# Patient Record
Sex: Male | Born: 1935 | Hispanic: No | Marital: Married | Smoking: Former smoker
Health system: Southern US, Community
[De-identification: ages and names within clinical notes are randomized; demographics above are authoritative.]

## PROBLEM LIST (undated history)

## (undated) DIAGNOSIS — K635 Polyp of colon: Secondary | ICD-10-CM

## (undated) DIAGNOSIS — I10 Essential (primary) hypertension: Secondary | ICD-10-CM

## (undated) DIAGNOSIS — D509 Iron deficiency anemia, unspecified: Secondary | ICD-10-CM

## (undated) DIAGNOSIS — R972 Elevated prostate specific antigen [PSA]: Secondary | ICD-10-CM

## (undated) DIAGNOSIS — R7302 Impaired glucose tolerance (oral): Secondary | ICD-10-CM

## (undated) DIAGNOSIS — F4310 Post-traumatic stress disorder, unspecified: Secondary | ICD-10-CM

## (undated) DIAGNOSIS — G5620 Lesion of ulnar nerve, unspecified upper limb: Secondary | ICD-10-CM

## (undated) DIAGNOSIS — C801 Malignant (primary) neoplasm, unspecified: Secondary | ICD-10-CM

## (undated) DIAGNOSIS — G629 Polyneuropathy, unspecified: Secondary | ICD-10-CM

## (undated) DIAGNOSIS — K5790 Diverticulosis of intestine, part unspecified, without perforation or abscess without bleeding: Secondary | ICD-10-CM

## (undated) HISTORY — DX: Essential (primary) hypertension: I10

## (undated) HISTORY — PX: OTHER SURGICAL HISTORY: SHX169

## (undated) HISTORY — DX: Polyp of colon: K63.5

## (undated) HISTORY — PX: CYSTOSTOMY W/ BLADDER BIOPSY: SHX1431

## (undated) HISTORY — DX: Lesion of ulnar nerve, unspecified upper limb: G56.20

## (undated) HISTORY — DX: Elevated prostate specific antigen (PSA): R97.20

## (undated) HISTORY — DX: Impaired glucose tolerance (oral): R73.02

## (undated) HISTORY — DX: Diverticulosis of intestine, part unspecified, without perforation or abscess without bleeding: K57.90

---

## 2007-12-20 ENCOUNTER — Encounter: Payer: Self-pay | Admitting: Family Medicine

## 2007-12-20 DIAGNOSIS — G5620 Lesion of ulnar nerve, unspecified upper limb: Secondary | ICD-10-CM

## 2007-12-20 HISTORY — DX: Lesion of ulnar nerve, unspecified upper limb: G56.20

## 2008-07-15 ENCOUNTER — Ambulatory Visit: Payer: Self-pay | Admitting: Family Medicine

## 2008-07-16 ENCOUNTER — Encounter: Payer: Self-pay | Admitting: Family Medicine

## 2008-07-16 LAB — CONVERTED CEMR LAB
BUN: 16 mg/dL (ref 6–23)
Basophils Absolute: 0 10*3/uL (ref 0.0–0.1)
Basophils Relative: 1 % (ref 0–1)
CO2: 25 meq/L (ref 19–32)
Calcium: 9.8 mg/dL (ref 8.4–10.5)
Chloride: 107 meq/L (ref 96–112)
Cholesterol: 159 mg/dL (ref 0–200)
Creatinine, Ser: 0.82 mg/dL (ref 0.40–1.50)
Eosinophils Absolute: 0.1 10*3/uL (ref 0.0–0.7)
Eosinophils Relative: 4 % (ref 0–5)
Glucose, Bld: 119 mg/dL — ABNORMAL HIGH (ref 70–99)
HCT: 44.2 % (ref 39.0–52.0)
HDL: 47 mg/dL (ref >39)
Hemoglobin: 14.2 g/dL (ref 13.0–17.0)
LDL Cholesterol: 98 mg/dL (ref 0–99)
Lymphocytes Relative: 51 % — ABNORMAL HIGH (ref 12–46)
Lymphs Abs: 1.3 10*3/uL (ref 0.7–4.0)
MCHC: 32.1 g/dL (ref 30.0–36.0)
MCV: 90.2 fL (ref 78.0–100.0)
Monocytes Absolute: 0.3 10*3/uL (ref 0.1–1.0)
Monocytes Relative: 12 % (ref 3–12)
Neutro Abs: 0.8 10*3/uL — ABNORMAL LOW (ref 1.7–7.7)
Neutrophils Relative %: 32 % — ABNORMAL LOW (ref 43–77)
PSA: 3.43 ng/mL (ref 0.10–4.00)
Platelets: 149 10*3/uL — ABNORMAL LOW (ref 150–400)
Potassium: 4.5 meq/L (ref 3.5–5.3)
RBC: 4.9 M/uL (ref 4.22–5.81)
RDW: 14.3 % (ref 11.5–15.5)
Sodium: 142 meq/L (ref 135–145)
TSH: 2.235 microintl units/mL (ref 0.350–4.50)
Total CHOL/HDL Ratio: 3.4
Triglycerides: 71 mg/dL (ref <150)
VLDL: 14 mg/dL (ref 0–40)
WBC: 2.6 10*3/uL — ABNORMAL LOW (ref 4.0–10.5)

## 2008-07-18 DIAGNOSIS — E739 Lactose intolerance, unspecified: Secondary | ICD-10-CM

## 2008-07-18 DIAGNOSIS — I10 Essential (primary) hypertension: Secondary | ICD-10-CM | POA: Insufficient documentation

## 2008-07-21 ENCOUNTER — Telehealth: Payer: Self-pay | Admitting: Family Medicine

## 2008-07-23 ENCOUNTER — Encounter: Payer: Self-pay | Admitting: Family Medicine

## 2008-09-15 ENCOUNTER — Ambulatory Visit: Payer: Self-pay | Admitting: Family Medicine

## 2008-09-15 LAB — CONVERTED CEMR LAB
Blood Glucose, Fingerstick: 109
Hgb A1c MFr Bld: 6.7 %

## 2008-12-15 ENCOUNTER — Ambulatory Visit: Payer: Self-pay | Admitting: Family Medicine

## 2008-12-15 LAB — CONVERTED CEMR LAB: Hgb A1c MFr Bld: 6.2 %

## 2008-12-19 DIAGNOSIS — R972 Elevated prostate specific antigen [PSA]: Secondary | ICD-10-CM

## 2008-12-19 HISTORY — DX: Elevated prostate specific antigen (PSA): R97.20

## 2009-01-26 ENCOUNTER — Ambulatory Visit: Payer: Self-pay | Admitting: Family Medicine

## 2009-07-17 ENCOUNTER — Ambulatory Visit: Payer: Self-pay | Admitting: Family Medicine

## 2009-07-17 DIAGNOSIS — R5383 Other fatigue: Secondary | ICD-10-CM

## 2009-07-17 DIAGNOSIS — R5381 Other malaise: Secondary | ICD-10-CM

## 2009-07-18 DIAGNOSIS — E663 Overweight: Secondary | ICD-10-CM

## 2009-09-08 ENCOUNTER — Encounter: Payer: Self-pay | Admitting: Family Medicine

## 2009-09-10 ENCOUNTER — Encounter: Payer: Self-pay | Admitting: Family Medicine

## 2009-09-10 LAB — CONVERTED CEMR LAB
BUN: 12 mg/dL (ref 6–23)
Chloride: 106 meq/L (ref 96–112)
Eosinophils Relative: 9 % — ABNORMAL HIGH (ref 0–5)
Glucose, Bld: 126 mg/dL — ABNORMAL HIGH (ref 70–99)
HCT: 43.4 % (ref 39.0–52.0)
Hemoglobin: 13.9 g/dL (ref 13.0–17.0)
Hgb A1c MFr Bld: 6.4 % — ABNORMAL HIGH (ref 4.6–6.1)
LDL Cholesterol: 99 mg/dL (ref 0–99)
Lymphocytes Relative: 47 % — ABNORMAL HIGH (ref 12–46)
Lymphs Abs: 1.4 10*3/uL (ref 0.7–4.0)
Monocytes Absolute: 0.4 10*3/uL (ref 0.1–1.0)
Potassium: 4.5 meq/L (ref 3.5–5.3)
Triglycerides: 63 mg/dL (ref <150)
VLDL: 13 mg/dL (ref 0–40)
WBC: 2.9 10*3/uL — ABNORMAL LOW (ref 4.0–10.5)

## 2009-09-17 ENCOUNTER — Ambulatory Visit: Payer: Self-pay | Admitting: Family Medicine

## 2009-09-20 DIAGNOSIS — R972 Elevated prostate specific antigen [PSA]: Secondary | ICD-10-CM | POA: Insufficient documentation

## 2009-10-12 ENCOUNTER — Telehealth: Payer: Self-pay | Admitting: Family Medicine

## 2009-10-13 ENCOUNTER — Telehealth: Payer: Self-pay | Admitting: Family Medicine

## 2009-10-20 LAB — CONVERTED CEMR LAB: PSA: 6.58 ng/mL — ABNORMAL HIGH (ref 0.10–4.00)

## 2009-12-19 DIAGNOSIS — K635 Polyp of colon: Secondary | ICD-10-CM

## 2009-12-19 HISTORY — DX: Polyp of colon: K63.5

## 2010-01-26 ENCOUNTER — Ambulatory Visit: Payer: Self-pay | Admitting: Family Medicine

## 2010-02-11 DIAGNOSIS — J309 Allergic rhinitis, unspecified: Secondary | ICD-10-CM | POA: Insufficient documentation

## 2010-06-07 ENCOUNTER — Ambulatory Visit: Payer: Self-pay | Admitting: Family Medicine

## 2010-06-08 LAB — CONVERTED CEMR LAB
BUN: 17 mg/dL (ref 6–23)
Basophils Absolute: 0 10*3/uL (ref 0.0–0.1)
Basophils Relative: 1 % (ref 0–1)
Calcium: 10.3 mg/dL (ref 8.4–10.5)
Creatinine, Ser: 0.8 mg/dL (ref 0.40–1.50)
Eosinophils Absolute: 0.1 10*3/uL (ref 0.0–0.7)
Glucose, Bld: 111 mg/dL — ABNORMAL HIGH (ref 70–99)
Hemoglobin: 13.3 g/dL (ref 13.0–17.0)
Hgb A1c MFr Bld: 5.9 % — ABNORMAL HIGH (ref <5.7)
MCHC: 32.4 g/dL (ref 30.0–36.0)
MCV: 88.2 fL (ref 78.0–100.0)
Monocytes Absolute: 0.3 10*3/uL (ref 0.1–1.0)
Neutro Abs: 1.3 10*3/uL — ABNORMAL LOW (ref 1.7–7.7)
RDW: 13.6 % (ref 11.5–15.5)
Sodium: 141 meq/L (ref 135–145)

## 2010-07-15 ENCOUNTER — Encounter: Payer: Self-pay | Admitting: Gastroenterology

## 2010-07-16 ENCOUNTER — Encounter: Payer: Self-pay | Admitting: Gastroenterology

## 2010-07-20 ENCOUNTER — Emergency Department (HOSPITAL_COMMUNITY): Admission: EM | Admit: 2010-07-20 | Discharge: 2010-07-21 | Payer: Self-pay | Admitting: Emergency Medicine

## 2010-07-23 ENCOUNTER — Ambulatory Visit: Payer: Self-pay | Admitting: Gastroenterology

## 2010-07-23 ENCOUNTER — Encounter: Payer: Self-pay | Admitting: Family Medicine

## 2010-07-23 ENCOUNTER — Ambulatory Visit (HOSPITAL_COMMUNITY): Admission: RE | Admit: 2010-07-23 | Discharge: 2010-07-23 | Payer: Self-pay | Admitting: Gastroenterology

## 2010-07-28 ENCOUNTER — Encounter: Payer: Self-pay | Admitting: Gastroenterology

## 2010-08-04 ENCOUNTER — Telehealth (INDEPENDENT_AMBULATORY_CARE_PROVIDER_SITE_OTHER): Payer: Self-pay

## 2010-10-20 ENCOUNTER — Ambulatory Visit: Payer: Self-pay | Admitting: Family Medicine

## 2010-11-04 ENCOUNTER — Telehealth: Payer: Self-pay | Admitting: Family Medicine

## 2010-11-25 ENCOUNTER — Telehealth: Payer: Self-pay | Admitting: Family Medicine

## 2011-01-18 NOTE — Progress Notes (Signed)
Summary: Pt came by office/ was supposed to have Flex sig today  Phone Note Call from Patient   Caller: Patient Summary of Call: Pt came by office. Said he spoke with someone earlier this week and was told to come here at [address] today. Some confusion about his flex sig today. He did not prep. Has questions for Dr. Darrick Penna in reference to the Flex Sig.  He can be reached at home and does not want to reschedule yet until he speaks with Dr. Darrick Penna.  Initial call taken by: Cloria Spring LPN,  August 04, 2010 11:47 AM     Appended Document: Pt came by office/ was supposed to have Flex sig today Called pt. Discussed results of prior procedure and why pt needs a FLEX sig. Needs rectal polyp removed. Triage/RSC pt and call with instructions. RCS after AUG 31.   Appended Document: Pt came by office/ was supposed to have Flex sig today Pt came by the office and just wanted to let Dr. Darrick Penna know he has decided to NOT have the polyp removed at this time. York Spaniel he will call if he decides any different, and he appreciates all Dr. Darrick Penna has done and her taking time to explain things to him, but he has just made up his mind for now, not to do it.   Appended Document: Pt came by office/ was supposed to have Flex sig today Called pt to discuss his decision. Explained waiting to have polyp removed will only make removal of polyp down the road a lot more complicated: inability to excise via endoscopy, surgery/? colostomy, progression to cancer, blood transfusion, trip to [facility] for EMR. Pt concerned about the cost. Will call pt FEB 2012 to schedule FLEX SIG polypectomy.   Appended Document: Pt came by office/ was supposed to have Flex sig today REMINDER IN COMPUTER

## 2011-01-18 NOTE — Letter (Signed)
Summary: EKG  EKG   Imported By: Lind Guest 05/24/2010 13:25:35  _____________________________________________________________________  External Attachment:    Type:   Image     Comment:   External Document

## 2011-01-18 NOTE — Assessment & Plan Note (Signed)
Summary: office visit   Vital Signs:  Patient profile:   77 year old male Height:      61.5 inches Weight:      209 pounds BMI:     38.99 O2 Sat:      99 % Pulse rate:   77 / minute Pulse rhythm:   regular Resp:     16 per minute BP sitting:   150 / 84  (left arm) Cuff size:   large  Vitals Entered By: Everitt Amber LPN (June 07, 2010 4:01 PM)  Nutrition Counseling: Patient's BMI is greater than 25 and therefore counseled on weight management options. CC: Follow up chronic problems   CC:  Follow up chronic problems.  History of Present Illness: Reports  that he ahs been doing well. Denies recent fever or chills. Denies sinus pressure, nasal congestion , ear pain or sore throat. Denies chest congestion, or cough productive of sputum. Denies chest pain, palpitations, PND, orthopnea or leg swelling. Denies abdominal pain, nausea, vomitting, diarrhea or constipation. Denies change in bowel movements or bloody stool. Denies dysuria , frequency, incontinence or hesitancy. Denies  joint pain, swelling, or reduced mobility. Denies headaches, vertigo, seizures. Denies depression, anxiety or insomnia. Denies  rash, lesions, or itch.     Current Medications (verified): 1)  Aspirin 81 Mg  Tbec (Aspirin) .... Take 1 Tablet By Mouth Once A Day 2)  Amlodipine Besylate 5 Mg Tabs (Amlodipine Besylate) .... Take 1 Tablet By Mouth Once A Day 3)  Zinc 50 Mg Tabs (Zinc) .... Take 1 Tablet By Mouth Once A Day 4)  Stool Softener 100 Mg Caps (Docusate Sodium) .... 2 Tabs Once Daily As Needed  Allergies (verified): 1)  ! Penicillin  Review of Systems      See HPI Eyes:  Denies discharge, eye pain, and red eye. Endo:  Denies excessive thirst and excessive urination. Heme:  Denies abnormal bruising and bleeding. Allergy:  Denies hives or rash and itching eyes.  Physical Exam  General:  alert, well-hydrated, and overweight-appearing.  HEENT: No facial asymmetry,  EOMI, No sinus  tenderness, TM's Clear, oropharynx  pink and moist. Erythema and edema of nasal mucosa  Chest: Clear to auscultation bilaterally.  CVS: S1, S2, No murmurs, No S3.   Abd: Soft, Nontender.  MS: Adequate ROM spine, hips, shoulders and knees.  Ext: No edema.   CNS: CN 2-12 intact, power tone and sensation normal throughout.   Skin: Intact, no visible lesions or rashes.  Psych: Good eye contact, normal affect.  Memory intact, not anxious or depressed appearing.     Impression & Recommendations:  Problem # 1:  IMPAIRED FASTING GLUCOSE (ICD-790.21) Assessment Comment Only  Orders: T- Hemoglobin A1C ([id number]), the impt of lowcarb diet and weight loss with regular exercise was discussed and stressed  Labs Reviewed: Creat: 0.85 (09/08/2009)     Problem # 2:  PROSTATE SPECIFIC ANTIGEN, ELEVATED (ICD-790.93) Assessment: Comment Only followed by urology  Problem # 3:  HYPERTENSION (ICD-401.9) Assessment: Deteriorated  His updated medication list for this problem includes:    Amlodipine Besylate 5 Mg Tabs (Amlodipine besylate) .Marland Kitchen... Take 1 tablet by mouth once a day, lifwestyle change only at this time  BP today: 150/84 Prior BP: 130/88 (01/26/2010)  Labs Reviewed: K+: 4.5 (09/08/2009) Creat: : 0.85 (09/08/2009)   Chol: 161 (09/08/2009)   HDL: 49 (09/08/2009)   LDL: 99 (09/08/2009)   TG: 63 (09/08/2009)  Problem # 4:  OBESITY, UNSPECIFIED (ICD-278.00) Assessment: Unchanged  Ht:  61.5 (06/07/2010)   Wt: 209 (06/07/2010)   BMI: 38.99 (06/07/2010)  Complete Medication List: 1)  Aspirin 81 Mg Tbec (Aspirin) .... Take 1 tablet by mouth once a day 2)  Amlodipine Besylate 5 Mg Tabs (Amlodipine besylate) .... Take 1 tablet by mouth once a day 3)  Zinc 50 Mg Tabs (Zinc) .... Take 1 tablet by mouth once a day 4)  Stool Softener 100 Mg Caps (Docusate sodium) .... 2 tabs once daily as needed  Other Orders: T-Basic Metabolic Panel [id number]) T-CBC w/Diff  [id number]) Gastroenterology Referral (GI)  Patient Instructions: 1)  Please schedule a follow-up appointment in 4 months. 2)  It is important that you exercise regularly at least 20 minutes 5 times a week. If you develop chest pain, have severe difficulty breathing, or feel very tired , stop exercising immediately and seek medical attention. 3)  You need to lose weight. Consider a lower calorie diet and regular exercise.  4)  Pls cut back on your salt and sweets, and increase fruit and veg, drink water, and keep active. 5)  BP today is 150/84 6)  BMP prior to visit, ICD-9: 7)  CBC w/ Diff prior to visit, ICD-9: 8)  HbgA1C prior to visit, ICD-9: 9)  Pls keep appt with the urologist Prescriptions: AMLODIPINE BESYLATE 5 MG TABS (AMLODIPINE BESYLATE) Take 1 tablet by mouth once a day  #90 Not Speci x 3   Entered by:   Everitt Amber LPN   Authorized by:   Syliva Overman MD   Signed by:   Everitt Amber LPN on 29/56/2130   Method used:   [facility] to        [facility] [phone]* (retail)       [address]       [place], [place]  [address]       Ph: [phone] or [phone]       Fax: [phone]   RxID:   [id number]

## 2011-01-18 NOTE — Letter (Signed)
Summary: HISTORY AND PHYSICAL  HISTORY AND PHYSICAL   Imported By: Lind Guest 05/24/2010 13:26:04  _____________________________________________________________________  External Attachment:    Type:   Image     Comment:   External Document

## 2011-01-18 NOTE — Letter (Signed)
Summary: Internal Other /Flex Sig order  Internal Other /Flex Sig order   Imported By: Cloria Spring LPN 32/44/0102 72:53:66  _____________________________________________________________________  External Attachment:    Type:   Image     Comment:   External Document

## 2011-01-18 NOTE — Letter (Signed)
Summary: DEMO  DEMO   Imported By: Lind Guest 05/24/2010 13:25:14  _____________________________________________________________________  External Attachment:    Type:   Image     Comment:   External Document

## 2011-01-18 NOTE — Letter (Signed)
Summary: OFFICE NOTES  OFFICE NOTES   Imported By: Lind Guest 05/24/2010 13:26:47  _____________________________________________________________________  External Attachment:    Type:   Image     Comment:   External Document

## 2011-01-18 NOTE — Letter (Signed)
Summary: TRIAGE   TRIAGE   Imported By: Rexene Alberts 07/15/2010 14:17:06  _____________________________________________________________________  External Attachment:    Type:   Image     Comment:   External Document  Appended Document: TRIAGE  TRILYTE PREP. CONTINUE ASA.  Appended Document: TRIAGE  Faxed prep Rx and instructions to [facility] pharmacy. Called pt to make aware, many rings and  no answer.   Appended Document: TRIAGE  Pt informed Rx and instructions faxed to [facility].

## 2011-01-18 NOTE — Progress Notes (Signed)
Summary: medicine  Phone Note Call from Patient   Summary of Call: HE NEEDS his amlodipine 10 mg sent to [facility] so please update his phar. Initial call taken by: Lind Guest,  November 25, 2010 8:45 AM    Prescriptions: AMLODIPINE BESYLATE 10 MG TABS (AMLODIPINE BESYLATE) Take 1 tab by mouth at bedtime  #90 x 0   Entered by:   Adella Hare LPN   Authorized by:   Syliva Overman MD   Signed by:   Adella Hare LPN on 16/09/9603   Method used:   Electronically to        [facility]. [phone]* (retail)       [address]       [place], [place]  [address]       Ph: [phone] or [phone]       Fax: [phone]   RxID:   [id number]

## 2011-01-18 NOTE — Assessment & Plan Note (Signed)
Summary: office visit   Vital Signs:  Patient profile:   77 year old male Height:      61.5 inches Weight:      207.08 pounds BMI:     38.63 Pulse rate:   74 / minute Pulse rhythm:   regular Resp:     16 per minute BP sitting:   130 / 88  (left arm)  Vitals Entered By: Worthy Keeler LPN (January 26, 2010 1:08 PM)  Nutrition Counseling: Patient's BMI is greater than 25 and therefore counseled on weight management options. CC: follow up Is Patient Diabetic? No Pain Assessment Patient in pain? no        CC:  follow up.  History of Present Illness: Reports  that he has generally  doing well. Denies recent fever or chills. Denies sinus pressure, ear pain or sore throat.Reports increased nasal stuffiness with cleardrainage, not unusual at this time of the year  Denies chest congestion, or cough productive of sputum. Denies chest pain, palpitations, PND, orthopnea or leg swelling. Denies abdominal pain, nausea, vomitting, diarrhea or constipation. Denies change in bowel movements or bloody stool. Denies dysuria , frequency, incontinence or hesitancy. Denies  joint pain, swelling, or reduced mobility. Denies headaches, vertigo, seizures. Denies depression, anxiety or insomnia. Denies  rash, lesions, or itch.     Allergies: 1)  ! Penicillin  Review of Systems      See HPI Eyes:  Denies blurring and discharge. Neuro:  Denies inability to speak, seizures, and sensation of room spinning. Endo:  Denies cold intolerance, excessive hunger, excessive thirst, excessive urination, heat intolerance, polyuria, and weight change. Heme:  Denies abnormal bruising and bleeding. Allergy:  Complains of seasonal allergies and sneezing.  Physical Exam  General:  alert, well-hydrated, and overweight-appearing.  HEENT: No facial asymmetry,  EOMI, No sinus tenderness, TM's Clear, oropharynx  pink and moist. Erythema and edema of nasal mucosa  Chest: Clear to auscultation bilaterally.    CVS: S1, S2, No murmurs, No S3.   Abd: Soft, Nontender.  MS: Adequate ROM spine, hips, shoulders and knees.  Ext: No edema.   CNS: CN 2-12 intact, power tone and sensation normal throughout.   Skin: Intact, no visible lesions or rashes.  Psych: Good eye contact, normal affect.  Memory intact, not anxious or depressed appearing.     Impression & Recommendations:  Problem # 1:  PROSTATE SPECIFIC ANTIGEN, ELEVATED (ICD-790.93) Assessment Comment Only pt being followed by urology  Problem # 2:  HYPERTENSION (ICD-401.9) Assessment: Improved  His updated medication list for this problem includes:    Amlodipine Besylate 5 Mg Tabs (Amlodipine besylate) .Marland Kitchen... Take 1 tablet by mouth once a day  BP today: 130/88 Prior BP: 140/90 (09/17/2009)  Labs Reviewed: K+: 4.5 (09/08/2009) Creat: : 0.85 (09/08/2009)   Chol: 161 (09/08/2009)   HDL: 49 (09/08/2009)   LDL: 99 (09/08/2009)   TG: 63 (09/08/2009)  Problem # 3:  OBESITY, UNSPECIFIED (ICD-278.00) Assessment: Unchanged  Ht: 61.5 (01/26/2010)   Wt: 207.08 (01/26/2010)   BMI: 38.63 (01/26/2010)  Problem # 4:  IMPAIRED GLUCOSE TOLERANCE (ICD-271.3) Assessment: Comment Only hBA1C is 6.4, pt counselled on impt of low carb diet , exercise and weight loss to prevent diabetes  Problem # 5:  ALLERGIC RHINITIS (ICD-477.9) Assessment: Deteriorated  His updated medication list for this problem includes:    Zyrtec Hives Relief 10 Mg Tabs (Cetirizine hcl) .Marland Kitchen... Take 1 tablet by mouth once a day  Complete Medication List: 1)  Aspirin 81  Mg Tbec (Aspirin) .... Take 1 tablet by mouth once a day 2)  Amlodipine Besylate 5 Mg Tabs (Amlodipine besylate) .... Take 1 tablet by mouth once a day 3)  Zyrtec Hives Relief 10 Mg Tabs (Cetirizine hcl) .... Take 1 tablet by mouth once a day  Other Orders: Hemoglobin A1C ([address])  Patient Instructions: 1)  Please schedule a follow-up appointment in 4.5 months. 2)  It is important that you exercise regularly  at least 20 minutes 5 times a week. If you develop chest pain, have severe difficulty breathing, or feel very tired , stop exercising immediately and seek medical attention. 3)  You need to lose weight. Consider a lower calorie diet and regular exercise.  4)  your blood pressure is much better, no med change. 5)  new med sent in for allergies, also use saline flushes regularly Prescriptions: ZYRTEC HIVES RELIEF 10 MG TABS (CETIRIZINE HCL) Take 1 tablet by mouth once a day  #30 x 3   Entered and Authorized by:   Syliva Overman MD   Signed by:   Adella Hare LPN on 60/45/4098   Method used:   Electronically to        [facility]. [phone]* (retail)       [address]       [place], [place]  [address]       Ph: [phone] or [phone]       Fax: [phone]   RxID:   [id number]   Laboratory Results   Blood Tests   Date/Time Received: January 26, 2010  Date/Time Reported: January 26, 2010   HGBA1C: 6.4%   (Normal Range: Non-Diabetic - 3-6%   Control Diabetic - 6-8%)

## 2011-01-18 NOTE — Op Note (Signed)
Summary: Operative Report  Operative Report   Imported By: Lind Guest 07/27/2010 10:31:25  _____________________________________________________________________  External Attachment:    Type:   Image     Comment:   External Document

## 2011-01-18 NOTE — Progress Notes (Signed)
Summary: MEDICINE SEND TO [facility]  Phone Note Call from Patient   Summary of Call: PLEASE SEND HIS [facility]IC[facility]  [facility] HE HAS ENOUGH AMLOP. AND POT MEDICINE Initial call taken by: Lind Guest,  November 04, 2010 4:21 PM    Prescriptions: HYDROCHLOROTHIAZIDE 12.5 MG CAPS (HYDROCHLOROTHIAZIDE) Take 1 capsule by mouth once a day  #90 x 1   Entered by:   Adella Hare LPN   Authorized by:   Syliva Overman MD   Signed by:   Adella Hare LPN on 28/41/3244   Method used:   Faxed to ...       [facility] [address] (retail)       [address]       [place], [place]  [address]       Ph: [phone]       Fax: [phone]   RxID:   [id number]

## 2011-01-18 NOTE — Assessment & Plan Note (Signed)
Summary: office visit   Vital Signs:  Patient profile:   77 year old male Height:      61.5 inches Weight:      209 pounds BMI:     38.99 O2 Sat:      99 % on Room air Pulse rate:   82 / minute Pulse rhythm:   regular Resp:     16 per minute BP sitting:   146 / 90  (left arm)  Vitals Entered By: Mauricia Area CMA (October 20, 2010 9:27 AM)  Nutrition Counseling: Patient's BMI is greater than 25 and therefore counseled on weight management options.  O2 Flow:  Room air CC: follow up Comments Been to ER three times since last visit due to allergic reactions.   CC:  follow up.  History of Present Illness: Reports  that he has been doing fairly well Denies recent fever or chills. Denies sinus pressure, nasal congestion , ear pain or sore throat. Denies chest congestion, or cough productive of sputum. Denies chest pain, palpitations, PND, orthopnea or leg swelling. Denies abdominal pain, nausea, vomitting, diarrhea or constipation. Denies change in bowel movements or bloody stool. Denies dysuria , frequency, incontinence or hesitancy.  Denies headaches, vertigo, seizures. Denies depression, anxiety or insomnia. Denies  rash, lesions, or itch.     Allergies (verified): 1)  ! 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 Eyes:  Denies discharge, eye pain, and red eye. MS:  Complains of stiffness; OCCASIONAL JOINT STIFFNESS. Derm:  Denies itching, lesion(s), and rash. Neuro:  Denies headaches, poor balance, and seizures. Psych:  Denies anxiety and depression. Endo:  Denies cold intolerance, excessive hunger, excessive thirst, and excessive urination. Heme:  Denies abnormal bruising and bleeding. Allergy:   Complains of seasonal allergies.  Physical Exam  General:  Well-developed,well-nourished,in no acute distress; alert,appropriate and cooperative throughout examination HEENT: No facial asymmetry,  EOMI, No sinus tenderness, TM's Clear, oropharynx  pink and moist.   Chest: Clear to auscultation bilaterally.  CVS: S1, S2, No murmurs, No S3.   Abd: Soft, Nontender.  MS: Adequate ROM spine, hips, shoulders and knees.  Ext: No edema.   CNS: CN 2-12 intact, power tone and sensation normal throughout.   Skin: Intact, no visible lesions or rashes.  Psych: Good eye contact, normal affect.  Memory intact, not anxious or depressed appearing.    Impression & Recommendations:  Problem # 1:  IMPAIRED FASTING GLUCOSE (ICD-790.21) Assessment Improved  Labs Reviewed: Creat: 0.80 (06/07/2010)    Pt advised to reduce carbohydrate intake, espescially sweets, and to start regular physical activity, at least 30 minutes 5 days weekly, to enable weight loss, and reduce the risk of becoming diabetic   Problem # 2:  HYPERTENSION (ICD-401.9) Assessment: Unchanged  The following medications were removed from the medication list:    Amlodipine Besylate 5 Mg Tabs (Amlodipine besylate) .Marland Kitchen... Take 1 tablet by mouth once a day His updated medication list for this problem includes:    Amlodipine Besylate 10 Mg Tabs (Amlodipine besylate) .Marland Kitchen... Take 1 tab by mouth at bedtime    Hydrochlorothiazide 12.5 Mg Caps (Hydrochlorothiazide) .Marland Kitchen... Take 1 capsule by mouth once a day  Orders: T-Basic Metabolic Panel [id number]) Medicare Electronic Prescription [id number])  BP today: 146/90 Prior BP: 150/84 (  06/07/2010)  Labs Reviewed: K+: 3.8 (06/07/2010) Creat: : 0.80 (06/07/2010)   Chol: 161 (09/08/2009)   HDL: 49 (09/08/2009)   LDL: 99 (09/08/2009)   TG: 63 (09/08/2009)  Problem # 3:  ALLERGIC RHINITIS (ICD-477.9) Assessment: Unchanged  His updated medication list for this problem includes:    Zyrtec Hives  Relief 10 Mg Tabs (Cetirizine hcl) .Marland Kitchen... Take 1 tablet by mouth once a day as needed' for itching  Problem # 4:  OBESITY, UNSPECIFIED (ICD-278.00) Assessment: Unchanged  Ht: 61.5 (10/20/2010)   Wt: 209 (10/20/2010)   BMI: 38.99 (10/20/2010) therapeutic lifestyle change discussed and encouraged  Problem # 5:  PROSTATE SPECIFIC ANTIGEN, ELEVATED (ICD-790.93) Assessment: Comment Only followed by urology, benign disease AT THIS POINT  Complete Medication List: 1)  Aspirin 81 Mg Tbec (Aspirin) .... Take 1 tablet by mouth once a day 2)  Zinc 50 Mg Tabs (Zinc) .... Take 1 tablet by mouth once a day 3)  Stool Softener 100 Mg Caps (Docusate sodium) .... 2 tabs once daily as needed 4)  Amlodipine Besylate 10 Mg Tabs (Amlodipine besylate) .... Take 1 tab by mouth at bedtime 5)  Hydrochlorothiazide 12.5 Mg Caps (Hydrochlorothiazide) .... Take 1 capsule by mouth once a day 6)  Potassium 99 Mg Tabs (Potassium) .... Take 1 tablet by mouth once a day 7)  Zyrtec Hives Relief 10 Mg Tabs (Cetirizine hcl) .... Take 1 tablet by mouth once a day as needed' for itching  Other Orders: T- Hemoglobin A1C ([id number]) Influenza Vaccine MCR ([address])  Patient Instructions: 1)  Please schedule a follow-up appointment in 3 months. 2)  It is important that you exercise regularly at least 20 minutes 5 times a week. If you develop chest pain, have severe difficulty breathing, or feel very tired , stop exercising immediately and seek medical attention. 3)  You need to lose weight. Consider a lower calorie diet and regular exercise.  4)  your bp is high, so I am increasing the amlodipine and adding hCTZ`with potssium. 5)  see med list. 6)  BMP prior to visit, ICD-9:  fasting fierst week in december ,aalso hBA1c 7)  flu vac Prescriptions: POTASSIUM 99 MG TABS (POTASSIUM) Take 1 tablet by mouth once a day  #90 x 1   Entered and Authorized by:   Syliva Overman MD   Signed by:   Syliva Overman MD on 10/20/2010    Method used:   [facility]  [address] [phone]* (retail)       [address]       [place], [place]  [address]       Ph: [phone] or [phone]       Fax: [phone]   RxID:   [id number] HYDROCHLOROTHIAZIDE 12.5 MG CAPS (HYDROCHLOROTHIAZIDE) Take 1 capsule by mouth once a day  #90 x 1   Entered and Authorized by:   Syliva Overman MD   Signed by:   Syliva Overman MD on 10/20/2010   Method used:   Printed then faxed to ...       [facility]  [address] [phone]* (retail)       [address]       [place], [place]  [address]       Ph: [phone] or [phone]       Fax: [phone]   RxID:   [id number] AMLODIPINE BESYLATE 10 MG  TABS (AMLODIPINE BESYLATE) Take 1 tab by mouth at bedtime  #90 x 1   Entered and Authorized by:   Syliva Overman MD   Signed by:   Syliva Overman MD on 10/20/2010   Method used:   Printed then faxed to ...       [facility]  [address] [phone]* (retail)       [address]       [place], [place]  [address]       Ph: [phone] or [phone]       Fax: [phone]   RxID:   [id number]    Orders Added: 1)  Est. Patient Level IV [[address]] 2)  T-Basic Metabolic Panel [id number] 3)  T- Hemoglobin A1C [[address]-23375] 4)  Influenza Vaccine MCR [00025] 5)  Medicare Electronic Prescription [G8553]   Immunizations Administered:  Influenza Vaccine:    Vaccine Type: Fluvax MCR    Site: left deltoid    Mfr: novartis    Dose: 0.5 ml    Route: IM    Given by: Mauricia Area CMA    Exp. Date: 04/2011    Lot #: [id number]    VIS given: 07/13/10 version given October 20, 2010.   Immunizations Administered:  Influenza Vaccine:    Vaccine Type: Fluvax MCR    Site: left deltoid    Mfr: novartis    Dose: 0.5 ml    Route: IM    Given by: Mauricia Area CMA    Exp. Date: 04/2011    Lot #: [id number]    VIS given: 07/13/10 version given October 20, 2010.

## 2011-01-18 NOTE — Letter (Signed)
Summary: Internal Other Malena Peer /instructions for prep  Internal Other Malena Peer /instructions for prep   Imported By: Cloria Spring LPN 87/56/4332 95:18:84  _____________________________________________________________________  External Attachment:    Type:   Image     Comment:   External Document

## 2011-01-18 NOTE — Letter (Signed)
Summary: TRIAGE  TRIAGE   Imported By: Rexene Alberts 07/15/2010 11:54:36  _____________________________________________________________________  External Attachment:    Type:   Image     Comment:   External Document

## 2011-01-20 ENCOUNTER — Ambulatory Visit (INDEPENDENT_AMBULATORY_CARE_PROVIDER_SITE_OTHER): Payer: Medicare Other | Admitting: Family Medicine

## 2011-01-20 ENCOUNTER — Ambulatory Visit: Admit: 2011-01-20 | Payer: Self-pay | Admitting: Family Medicine

## 2011-01-20 ENCOUNTER — Encounter: Payer: Self-pay | Admitting: Family Medicine

## 2011-01-20 DIAGNOSIS — E669 Obesity, unspecified: Secondary | ICD-10-CM

## 2011-01-20 DIAGNOSIS — I10 Essential (primary) hypertension: Secondary | ICD-10-CM

## 2011-01-20 DIAGNOSIS — R7301 Impaired fasting glucose: Secondary | ICD-10-CM

## 2011-01-20 DIAGNOSIS — J309 Allergic rhinitis, unspecified: Secondary | ICD-10-CM

## 2011-01-26 NOTE — Assessment & Plan Note (Signed)
Summary: folow up 3 months   Vital Signs:  Patient profile:   77 year old male Height:      61.5 inches Weight:      217.75 pounds BMI:     40.62 O2 Sat:      98 % Pulse rate:   79 / minute Pulse rhythm:   regular Resp:     16 per minute BP sitting:   126 / 84  (left arm) Cuff size:   large  Vitals Entered By: Everitt Amber LPN (January 20, 2011 9:41 AM)  Nutrition Counseling: Patient's BMI is greater than 25 and therefore counseled on weight management options. CC: Follow up chronic problems, has been having some swelling in his ankles off and on for the past few months Comments states he hasn't been taking the HCTZ 12.5. Didn't know he was supposed to  Sometimes he takes the amlodipine two times a day if his pressure is high   CC:  Follow up chronic problems and has been having some swelling in his ankles off and on for the past few months.  History of Present Illness: Reports  that he has been doing well. Denies recent fever or chills. Denies sinus pressure, nasal congestion , ear pain or sore throat. Denies chest congestion, or cough productive of sputum. Denies chest pain, palpitations, PND, orthopnea or leg swelling. Denies abdominal pain, nausea, vomitting, diarrhea or constipation. Denies change in bowel movements or bloody stool. Denies dysuria , frequency, incontinence or hesitancy. Denies  joint pain, swelling, or reduced mobility. Denies headaches, vertigo, seizures. Denies depression, anxiety or insomnia. Denies  rash, lesions, or itch.     Current Medications (verified): 1)  Aspirin 81 Mg  Tbec (Aspirin) .... Take 1 Tablet By Mouth Once A Day 2)  Zinc 50 Mg Tabs (Zinc) .... Take 1 Tablet By Mouth Once A Day 3)  Stool Softener 100 Mg Caps (Docusate Sodium) .... 2 Tabs Once Daily As Needed 4)  Amlodipine Besylate 10 Mg Tabs (Amlodipine Besylate) .... Take 1 Tab By Mouth At Bedtime 5)  Potassium 99 Mg Tabs (Potassium) .... Take 1 Tablet By Mouth Once A Day 6)   Zyrtec Hives Relief 10 Mg Tabs (Cetirizine Hcl) .... Take 1 Tablet By Mouth Once A Day As Needed' For Itching  Allergies (verified): 1)  ! Penicillin  Review of Systems      See HPI Eyes:  Denies blurring and discharge. GI:  Complains of abdominal pain and constipation; pt reports abdominal pain and bloating with excessive belching and flatulence. MS:  Complains of joint pain; right hip and right thigh pain. Endo:  Denies cold intolerance, excessive hunger, excessive thirst, and excessive urination. Heme:  Denies abnormal bruising and bleeding. Allergy:  Denies hives or rash, itching eyes, and seasonal allergies.  Physical Exam  General:  Well-developed,well-nourished,in no acute distress; alert,appropriate and cooperative throughout examination HEENT: No facial asymmetry,  EOMI, No sinus tenderness, TM's Clear, oropharynx  pink and moist.   Chest: Clear to auscultation bilaterally.  CVS: S1, S2, No murmurs, No S3.   Abd: Soft, Nontender.  MS: Adequate ROM spine, hips, shoulders and knees.  Ext: No edema.   CNS: CN 2-12 intact, power tone and sensation normal throughout.   Skin: Intact, no visible lesions or rashes.  Psych: Good eye contact, normal affect.  Memory intact, not anxious or depressed appearing.    Impression & Recommendations:  Problem # 1:  IMPAIRED FASTING GLUCOSE (ICD-790.21) Assessment Comment Only  Orders: T-  Hemoglobin A1C [phone]) Pt advised to reduce carbohydrate intake, espescially sweets, and to start regular physical activity, at least 30 minutes 5 days weekly, to enable weight loss, and reduce the risk of becoming diabetic   Problem # 2:  OBESITY, UNSPECIFIED (ICD-278.00) Assessment: Deteriorated  Ht: 61.5 (01/20/2011)   Wt: 217.75 (01/20/2011)   BMI: 40.62 (01/20/2011) therapeutic lifestyle change discussed and encouraged  Problem # 3:  HYPERTENSION (ICD-401.9) Assessment: Improved  The following medications were removed from the  medication list:    Hydrochlorothiazide 12.5 Mg Caps (Hydrochlorothiazide) .Marland Kitchen... Take 1 capsule by mouth once a day His updated medication list for this problem includes:    Amlodipine Besylate 10 Mg Tabs (Amlodipine besylate) .Marland Kitchen... Take 1 tab by mouth at bedtime  Orders: T-Basic Metabolic Panel [phone])  BP today: 126/84 Prior BP: 146/90 (10/20/2010)  Labs Reviewed: K+: 3.8 (06/07/2010) Creat: : 0.80 (06/07/2010)   Chol: 161 (09/08/2009)   HDL: 49 (09/08/2009)   LDL: 99 (09/08/2009)   TG: 63 (09/08/2009)  Problem # 4:  PROSTATE SPECIFIC ANTIGEN, ELEVATED (ICD-790.93) Assessment: Comment Only counselled pt re the impt of urology f/u. states he is trying to [place] zinc tabs , and willhave rept labs in the Spring  Complete Medication List: 1)  Aspirin 81 Mg Tbec (Aspirin) .... Take 1 tablet by mouth once a day 2)  Zinc 50 Mg Tabs (Zinc) .... Take 1 tablet by mouth once a day 3)  Stool Softener 100 Mg Caps (Docusate sodium) .... 2 tabs once daily as needed 4)  Amlodipine Besylate 10 Mg Tabs (Amlodipine besylate) .... Take 1 tab by mouth at bedtime 5)  Potassium 99 Mg Tabs (Potassium) .... Take 1 tablet by mouth once a day 6)  Zyrtec Hives Relief 10 Mg Tabs (Cetirizine hcl) .... Take 1 tablet by mouth once a day as needed' for itching  Other Orders: T-Lipid Profile [id number]) T-CBC w/Diff [id number]) T-TSH [phone]) T-PSA ([id number])  Patient Instructions: 1)  Please schedule a follow-up appointment in 3.5 months. 2)  It is important that you exercise regularly at least 30 minutes 5 times a week. If you develop chest pain, have severe difficulty breathing, or feel very tired , stop exercising immediately and seek medical attention. 3)  You need to lose weight. Consider a lower calorie diet and regular exercise.  4)  BMP prior to visit, ICD-9: 5)  Lipid Panel prior to visit, ICD-9: 6)  TSH prior to visit, ICD-9: 7)  CBC w/ Diff prior to visit, ICD-9:  fasting in 3.5  months 8)  PSA prior to visit, ICD-9: 9)  HbgA1C prior to visit, ICD-9:   Orders Added: 1)  Est. Patient Level IV [32951] 2)  T-Basic Metabolic Panel [[id number]] 3)  T-Lipid Profile [[id number]] 4)  T-CBC w/Diff [[id number]] 5)  T- Hemoglobin A1C [83036-23375] 6)  T-TSH [[id number]] 7)  T-PSA [[id number]]

## 2011-01-26 NOTE — Letter (Signed)
Summary: Medication Reconciliation Letter  [facility]  [address]   [place], [place] [address]   Phone: [phone]  Fax: [phone]    01/20/2011 MRN: [id number]  Dustin Chandler [address] [place], [place]  [address]  Dear Mr. DANIELLO,  We are glad to see you again today!  Please take a moment to review and update your medication list and Wellness questions then give this paper to the nurse when she calls you back for your appointment.Please include the over the counter medicines that you are currently taking.  Medication List:  ASPIRIN 81 MG  TBEC (ASPIRIN) Take 1 tablet by mouth once a day ZINC 50 MG TABS (ZINC) Take 1 tablet by mouth once a day STOOL SOFTENER 100 MG CAPS (DOCUSATE SODIUM) 2 tabs once daily as needed AMLODIPINE BESYLATE 10 MG TABS (AMLODIPINE BESYLATE) Take 1 tab by mouth at bedtime POTASSIUM 99 MG TABS (POTASSIUM) Take 1 tablet by mouth once a day ZYRTEC HIVES RELIEF 10 MG TABS (CETIRIZINE HCL) Take 1 tablet by mouth once a day as needed' for itching      Medication allergies:______________________________________________  Name of your current Pharmacy and location:_____________________________  ____________________________________________  Last Colonoscopy: Date:_____________ Where:____________________________  Last Tetanus Shot:__________ Last Flu Shot:__________   Last Pneumonia Shot:________  If you are Diabetic when and where was your last eye exam?______________  __________________________________________________________  Daytime Phone number:____________________________  Do you currently smoke or use tobacco products: Yes _____   No _____ If "Yes", how many packs per day: ______________  Has it been more than one year since your last visit to a physician? Yes______  No_______  Have you been seen in the Emergency Department since your last visit to the office? Yes______  No_______ If yes, please provide the name,  location, and date seen:__________________________________________  Have you been admitted to the hospital since your last visit to the office? Yes______  No_______ If yes, please provide the name, location, and date admitted:__________________________________________  Have you been seen by another physician since your last visit to the office? Yes______  No_______ If yes, please provide the name, location, and date seen:__________________________________________  Have you made any changes to your medications since your last visit to the office? Yes______  No_______ If yes, please mark changes on the list above.  When you are finished reviewing your medications, please sign one of the following statements.   I have reviewed the list of medicines above and they are correct,  Signature: ___________________________________________                                                  OR  I have reviewed the list of medicines and corrected any errors,  Signature: ___________________________________________     Thank you

## 2011-02-04 ENCOUNTER — Encounter (INDEPENDENT_AMBULATORY_CARE_PROVIDER_SITE_OTHER): Payer: Self-pay

## 2011-02-09 NOTE — Letter (Signed)
Summary: Recall Colonoscopy/Endoscopy, Change to Office Visit  [facility]  [address]   [place], [place] [address]   Phone: [phone]  Fax: [phone]      February 04, 2011   Dustin Chandler [address] [place], [place]  [address] 1934-07-13   Dear [name],   According to our records, it is time for you to schedule a flexible sigmoidoscopy for the polyp removal.   Please call [phone] at your convenience and ask to speak to the triage nurse so we can get you set up for this procedure.  Please do not neglect your health.   Sincerely,   Cloria Spring LPN  [facility] Ph: [phone]   Fax: [phone]

## 2011-05-27 ENCOUNTER — Encounter: Payer: Self-pay | Admitting: Family Medicine

## 2011-06-01 ENCOUNTER — Ambulatory Visit: Payer: Medicare Other | Admitting: Family Medicine

## 2011-06-01 ENCOUNTER — Encounter: Payer: Self-pay | Admitting: Family Medicine

## 2011-09-03 ENCOUNTER — Other Ambulatory Visit: Payer: Self-pay | Admitting: Family Medicine

## 2011-12-16 ENCOUNTER — Other Ambulatory Visit: Payer: Self-pay | Admitting: Family Medicine

## 2011-12-17 ENCOUNTER — Other Ambulatory Visit: Payer: Self-pay | Admitting: Family Medicine

## 2011-12-19 NOTE — Telephone Encounter (Signed)
Needs appointment before can refill meds.

## 2011-12-22 ENCOUNTER — Other Ambulatory Visit: Payer: Self-pay

## 2011-12-22 MED ORDER — AMLODIPINE BESYLATE 10 MG PO TABS: ORAL_TABLET | ORAL | Status: DC

## 2011-12-26 ENCOUNTER — Encounter: Payer: Self-pay | Admitting: Family Medicine

## 2011-12-27 ENCOUNTER — Ambulatory Visit (INDEPENDENT_AMBULATORY_CARE_PROVIDER_SITE_OTHER): Payer: Medicare Other | Admitting: Family Medicine

## 2011-12-27 ENCOUNTER — Encounter: Payer: Self-pay | Admitting: Family Medicine

## 2011-12-27 DIAGNOSIS — R5381 Other malaise: Secondary | ICD-10-CM

## 2011-12-27 DIAGNOSIS — E119 Type 2 diabetes mellitus without complications: Secondary | ICD-10-CM

## 2011-12-27 DIAGNOSIS — R7301 Impaired fasting glucose: Secondary | ICD-10-CM

## 2011-12-27 DIAGNOSIS — I10 Essential (primary) hypertension: Secondary | ICD-10-CM

## 2011-12-27 DIAGNOSIS — Z23 Encounter for immunization: Secondary | ICD-10-CM

## 2011-12-27 DIAGNOSIS — R972 Elevated prostate specific antigen [PSA]: Secondary | ICD-10-CM

## 2011-12-27 DIAGNOSIS — E669 Obesity, unspecified: Secondary | ICD-10-CM

## 2011-12-27 NOTE — Patient Instructions (Addendum)
F/u in 5.5 months.  Pneumonia vaccine today. You need a flu vaccine, please get this at your pharmacy since we have no more.  Keep active and follow a healthy diet , rich in fruit and vegetable.  Fasting cbc, chem 7, hBa1C, lipid as soon as possible, past due  No med changes at this time

## 2011-12-29 ENCOUNTER — Other Ambulatory Visit: Payer: Self-pay | Admitting: Family Medicine

## 2011-12-29 LAB — CBC
Platelets: 154 10*3/uL (ref 150–400)
RBC: 4.9 MIL/uL (ref 4.22–5.81)
RDW: 13.8 % (ref 11.5–15.5)
WBC: 3.8 10*3/uL — ABNORMAL LOW (ref 4.0–10.5)

## 2011-12-29 LAB — LIPID PANEL
LDL Cholesterol: 108 mg/dL — ABNORMAL HIGH (ref 0–99)
VLDL: 15 mg/dL (ref 0–40)

## 2011-12-29 LAB — HEMOGLOBIN A1C
Hgb A1c MFr Bld: 7 % — ABNORMAL HIGH
Mean Plasma Glucose: 154 mg/dL — ABNORMAL HIGH

## 2011-12-29 LAB — COMPREHENSIVE METABOLIC PANEL
ALT: 19 U/L (ref 0–53)
AST: 17 U/L (ref 0–37)
Albumin: 4.5 g/dL (ref 3.5–5.2)
Calcium: 10.1 mg/dL (ref 8.4–10.5)
Chloride: 107 mEq/L (ref 96–112)
Potassium: 4.4 mEq/L (ref 3.5–5.3)
Total Protein: 7 g/dL (ref 6.0–8.3)

## 2012-01-01 DIAGNOSIS — E119 Type 2 diabetes mellitus without complications: Secondary | ICD-10-CM | POA: Insufficient documentation

## 2012-01-01 NOTE — Assessment & Plan Note (Signed)
Improved. Pt applauded on succesful weight loss through lifestyle change, and encouraged to continue same. Weight loss goal set for the next several months.  

## 2012-01-01 NOTE — Assessment & Plan Note (Signed)
Controlled, no change in medication  

## 2012-01-01 NOTE — Progress Notes (Signed)
  Subjective:    Patient ID: Dustin Chandler, male    DOB: 1934/03/19, 78 y.o.   MRN: [id number]  HPI The PT is here for follow up and re-evaluation of chronic medical conditions, medication management and review of any available recent lab and radiology data.  Preventive health is updated, specifically  Cancer screening and Immunization.   Questions or concerns regarding consultations or procedures which the PT has had in the interim are  addressed. The PT denies any adverse reactions to current medications since the last visit.  There are no new concerns.  There are no specific complaints       Review of Systems See HPI Denies recent fever or chills. Denies sinus pressure, nasal congestion, ear pain or sore throat. Denies chest congestion, productive cough or wheezing. Denies chest pains, palpitations and leg swelling Denies abdominal pain, nausea, vomiting,diarrhea or constipation.   Denies dysuria, frequency, hesitancy or incontinence. C/o back pain and stiffness intermittently, not disabling. Denies headaches, seizures, numbness, or tingling. Denies depression, anxiety or insomnia. Denies skin break down or rash.        Objective:   Physical Exam Patient alert and oriented and in no cardiopulmonary distress.  HEENT: No facial asymmetry, EOMI, no sinus tenderness,  oropharynx pink and moist.  Neck supple no adenopathy.  Chest: Clear to auscultation bilaterally.  CVS: S1, S2 no murmurs, no S3.  ABD: Soft non tender. Bowel sounds normal.  Ext: No edema  MS: Adequate ROM spine, shoulders, hips and knees.  Skin: Intact, no ulcerations or rash noted.  Psych: Good eye contact, normal affect. Memory intact not anxious or depressed appearing.  CNS: CN 2-12 intact, power, tone and sensation normal throughout.        Assessment & Plan:

## 2012-01-01 NOTE — Assessment & Plan Note (Signed)
New diagnosis based on lab obtained after vist, p to start med and return fr re eval in 3 months, also counseled to test sugars once daily as well as to go to the diabetic ed class

## 2012-01-01 NOTE — Assessment & Plan Note (Signed)
States he wants no further eval of this problem at this time, which i assure him is not unreasonable at this age. Denies symptoms of urinary obstruction

## 2012-01-16 ENCOUNTER — Other Ambulatory Visit: Payer: Self-pay

## 2012-01-16 ENCOUNTER — Ambulatory Visit: Payer: Medicare Other

## 2012-01-16 DIAGNOSIS — I10 Essential (primary) hypertension: Secondary | ICD-10-CM

## 2012-01-16 MED ORDER — GLUCOSE BLOOD VI STRP: ORAL_STRIP | Status: DC

## 2012-01-16 MED ORDER — AMLODIPINE BESYLATE 10 MG PO TABS: ORAL_TABLET | ORAL | Status: DC

## 2012-01-16 MED ORDER — ACCU-CHEK MULTICLIX LANCETS MISC: Status: DC

## 2012-06-29 ENCOUNTER — Ambulatory Visit (INDEPENDENT_AMBULATORY_CARE_PROVIDER_SITE_OTHER): Payer: Medicare Other | Admitting: Family Medicine

## 2012-06-29 ENCOUNTER — Other Ambulatory Visit: Payer: Self-pay | Admitting: Family Medicine

## 2012-06-29 ENCOUNTER — Encounter: Payer: Self-pay | Admitting: Family Medicine

## 2012-06-29 VITALS — BP 150/100 | HR 60 | Resp 16 | Ht 74.0 in | Wt 207.0 lb

## 2012-06-29 DIAGNOSIS — Z0000 Encounter for general adult medical examination without abnormal findings: Secondary | ICD-10-CM

## 2012-06-29 DIAGNOSIS — I10 Essential (primary) hypertension: Secondary | ICD-10-CM

## 2012-06-29 LAB — BASIC METABOLIC PANEL WITH GFR
BUN: 12 mg/dL (ref 6–23)
CO2: 31 meq/L (ref 19–32)
Calcium: 10.4 mg/dL (ref 8.4–10.5)
Chloride: 106 meq/L (ref 96–112)
Creat: 0.8 mg/dL (ref 0.50–1.35)
Glucose, Bld: 124 mg/dL — ABNORMAL HIGH (ref 70–99)
Potassium: 4.8 meq/L (ref 3.5–5.3)
Sodium: 142 meq/L (ref 135–145)

## 2012-06-29 LAB — HEMOGLOBIN A1C
Hgb A1c MFr Bld: 6.8 % — ABNORMAL HIGH
Mean Plasma Glucose: 148 mg/dL — ABNORMAL HIGH

## 2012-06-29 MED ORDER — AMLODIPINE BESYLATE 10 MG PO TABS: 10.0000 mg | ORAL_TABLET | Freq: Every day | ORAL | Status: DC

## 2012-06-29 NOTE — Patient Instructions (Addendum)
F/u in 3.5 month  You need amlodipine 10mg  daily  Lab today HBA1C and chem 7.  Colonoscopy every 10 years.  Consider seriously advanced directives for end of life issues, and discuss with your family  No more cancer screening for prostate at this time, per your decision.  It is important that you exercise regularly at least 30 minutes 5 times a week. If you develop chest pain, have severe difficulty breathing, or feel very tired, stop exercising immediately and seek medical attention    A healthy diet is rich in fruit, vegetables and whole grains. Poultry fish, nuts and beans are a healthy choice for protein rather then red meat. A low sodium diet and drinking 64 ounces of water daily is generally recommended. Oils and sweet should be limited. Carbohydrates especially for those who are diabetic or overweight, should be limited to 30-45 gram per meal. It is important to eat on a regular schedule, at least 3 times daily. Snacks should be primarily fruits, vegetables or nuts.

## 2012-07-01 ENCOUNTER — Encounter: Payer: Self-pay | Admitting: Family Medicine

## 2012-07-01 DIAGNOSIS — Z0000 Encounter for general adult medical examination without abnormal findings: Secondary | ICD-10-CM | POA: Insufficient documentation

## 2012-07-01 DIAGNOSIS — Z7189 Other specified counseling: Secondary | ICD-10-CM | POA: Insufficient documentation

## 2012-07-01 NOTE — Assessment & Plan Note (Signed)
Annual wellness visit conducted. Updated labs needed. Pt refuses further f/u on elevated PSA and currently wants no further testing. Full assessment and recommendations are in the body of the note

## 2012-07-01 NOTE — Progress Notes (Signed)
Subjective:    Patient ID: Dustin Chandler, male    DOB: 1934-05-19, 78 y.o.   MRN: [id number]  HPI    Review of Systems     Objective:   Physical Exam        Assessment & Plan:  Preventive Screening-Counseling & Management   Patient present here today for a Medicare annual wellness visit.   Current Problems (verified)   Medications Prior to Visit Allergies (verified)   PAST HISTORY  Family History  Social History    Risk Factors  Current exercise habits: daily for 30 minutes, active in garden as well as doing odd jobs around house. I encouraged specific time to be set aside for physical activity specifically for his health, 30 minutes at least 5 days per week   Dietary issues discussed:Fruit , vegetable and adequate water stressed. He is already committed to healthy diet   Cardiac risk factors: hypertension, dyslipidemia and diabetes, managed by diet only per pt preference  Depression Screen  (Note: if answer to either of the following is "Yes", a more complete depression screening is indicated)   Over the past two weeks, have you felt down, depressed or hopeless? No , has recently resolved battle over his housing with the help of his children. Tears up over this , but states he has given all the worry of this over to his children Over the past two weeks, have you felt little interest or pleasure in doing things? No  Have you lost interest or pleasure in daily life? No  Do you often feel hopeless? No  Do you cry easily over simple problems? No   Activities of Daily Living  In your present state of health, do you have any difficulty performing the following activities?  Driving?: No Managing money?: No Feeding yourself?:No Getting from bed to chair?:No Climbing a flight of stairs?:No Preparing food and eating?:No Bathing or showering?:No Getting dressed?:No Getting to the toilet?:No Using the toilet?:No Moving around from place to place?: No  Fall  Risk Assessment In the past year have you fallen or had a near fall?:No Are you currently taking any medications that make you experience  dizziness?:No   Hearing Difficulties: No Do you often ask people to speak up or repeat themselves?:No Do you experience ringing or noises in your ears?:No Do you have difficulty understanding soft or whispered voices?:No  Cognitive Testing  Alert? Yes Normal Appearance?Yes  Oriented to person? Yes Place? Yes  Time? Yes  Displays appropriate judgment?Yes  Can read the correct time from a watch face? yes Are you having problems remembering things?No  Advanced Directives have been discussed with the patient?Yes    List the Names of Other Physician/Practitioners you currently use: None. Has elevated PSA, no interest in urology follow up, and refuses further PSA testing at this time   Indicate any recent Medical Services you may have received from other than [facility] providers in the past year (date may be approximate).   Assessment:    Annual Wellness Exam   Plan:    During the course of the visit the patient was educated and counseled about appropriate screening and preventive services including:  A healthy diet is rich in fruit, vegetables and whole grains. Poultry fish, nuts and beans are a healthy choice for protein rather then red meat. A low sodium diet and drinking 64 ounces of water daily is generally recommended. Oils and sweet should be limited. Carbohydrates especially for those who are diabetic or overweight, should  be limited to 30-45 gram per meal. It is important to eat on a regular schedule, at least 3 times daily. Snacks should be primarily fruits, vegetables or nuts. It is important that you exercise regularly at least 30 minutes 5 times a week. If you develop chest pain, have severe difficulty breathing, or feel very tired, stop exercising immediately and seek medical attention  Immunization reviewed and updated. Cancer screening  reviewed and updated    Patient Instructions (the written plan) was given to the patient.  Medicare Attestation  I have personally reviewed:  The patient's medical and social history  Their use of alcohol, tobacco or illicit drugs  Their current medications and supplements  The patient's functional ability including ADLs,fall risks, home safety risks, cognitive, and hearing and visual impairment  Diet and physical activities  Evidence for depression or mood disorders  The patient's weight, height, BMI, and visual acuity have been recorded in the chart. I have made referrals, counseling, and provided education to the patient based on review of the above and I have provided the patient with a written personalized care plan for preventive services.

## 2012-07-01 NOTE — Assessment & Plan Note (Signed)
Uncontrolled, pt to increase the dose of norvasc back to 10mg 

## 2012-07-04 ENCOUNTER — Ambulatory Visit: Payer: Medicare Other | Admitting: Family Medicine

## 2012-10-30 ENCOUNTER — Ambulatory Visit (INDEPENDENT_AMBULATORY_CARE_PROVIDER_SITE_OTHER): Payer: Medicare Other | Admitting: Family Medicine

## 2012-10-30 ENCOUNTER — Encounter: Payer: Self-pay | Admitting: Family Medicine

## 2012-10-30 VITALS — BP 130/80 | HR 80 | Resp 18 | Ht 74.0 in | Wt 212.1 lb

## 2012-10-30 DIAGNOSIS — E663 Overweight: Secondary | ICD-10-CM

## 2012-10-30 DIAGNOSIS — R972 Elevated prostate specific antigen [PSA]: Secondary | ICD-10-CM

## 2012-10-30 DIAGNOSIS — Z6825 Body mass index (BMI) 25.0-25.9, adult: Secondary | ICD-10-CM

## 2012-10-30 DIAGNOSIS — Z23 Encounter for immunization: Secondary | ICD-10-CM

## 2012-10-30 DIAGNOSIS — I10 Essential (primary) hypertension: Secondary | ICD-10-CM

## 2012-10-30 DIAGNOSIS — E119 Type 2 diabetes mellitus without complications: Secondary | ICD-10-CM

## 2012-10-30 DIAGNOSIS — E785 Hyperlipidemia, unspecified: Secondary | ICD-10-CM

## 2012-10-30 NOTE — Progress Notes (Signed)
  Subjective:    Patient ID: Dustin Chandler, male    DOB: 1934/02/24, 78 y.o.   MRN: [id number]  HPI The PT is here for follow up and re-evaluation of chronic medical conditions, medication management and review of any available recent lab and radiology data.  Preventive health is updated, specifically  Cancer screening and Immunization.   Questions or concerns regarding consultations or procedures which the PT has had in the interim are  addressed. The PT denies any adverse reactions to current medications since the last visit.  There are no new concerns.  There are no specific complaints       Review of Systems See HPI Denies recent fever or chills. Denies sinus pressure, nasal congestion, ear pain or sore throat. Denies chest congestion, productive cough or wheezing. Denies chest pains, palpitations and leg swelling Denies abdominal pain, nausea, vomiting,diarrhea or constipation.   Denies dysuria, frequency, hesitancy or incontinence. Denies joint pain, swelling and limitation in mobility. Denies headaches, seizures, numbness, or tingling. Denies depression, anxiety or insomnia. Denies skin break down or rash.        Objective:   Physical Exam Patient alert and oriented and in no cardiopulmonary distress.  HEENT: No facial asymmetry, EOMI, no sinus tenderness,  oropharynx pink and moist.  Neck supple no adenopathy.  Chest: Clear to auscultation bilaterally.  CVS: S1, S2 no murmurs, no S3.  ABD: Soft non tender. Bowel sounds normal.  Ext: No edema  MS: Adequate ROM spine, shoulders, hips and knees.  Skin: Intact, no ulcerations or rash noted.  Psych: Good eye contact, normal affect. Memory intact not anxious or depressed appearing.  CNS: CN 2-12 intact, power, tone and sensation normal throughout.        Assessment & Plan:

## 2012-10-30 NOTE — Patient Instructions (Addendum)
F/u with rectal end March, please call if you need me before  Flu vaccine today.  CBC, fasting lipid and chem 7 and HBa1C in March before visit  It is important that you exercise regularly at least 30 minutes 5 times a week. If you develop chest pain, have severe difficulty breathing, or feel very tired, stop exercising immediately and seek medical attention

## 2012-11-07 DIAGNOSIS — E785 Hyperlipidemia, unspecified: Secondary | ICD-10-CM | POA: Insufficient documentation

## 2012-11-07 NOTE — Assessment & Plan Note (Signed)
Improved, diet controlled only. Pt applauded on this, needs updated lab

## 2012-11-07 NOTE — Assessment & Plan Note (Signed)
Controlled, no change in medication DASH diet and commitment to daily physical activity for a minimum of 30 minutes discussed and encouraged, as a part of hypertension management. The importance of attaining a healthy weight is also discussed.  

## 2012-11-07 NOTE — Assessment & Plan Note (Signed)
Hyperlipidemia:Low fat diet discussed and encouraged.  Updated lab next year 

## 2012-12-17 ENCOUNTER — Other Ambulatory Visit: Payer: Self-pay | Admitting: Family Medicine

## 2013-06-20 ENCOUNTER — Other Ambulatory Visit: Payer: Self-pay | Admitting: Family Medicine

## 2016-03-30 ENCOUNTER — Emergency Department (HOSPITAL_COMMUNITY)
Admission: EM | Admit: 2016-03-30 | Discharge: 2016-03-30 | Disposition: A | Discharge status: Home / Self Care | Payer: Medicare Other | Attending: Emergency Medicine | Admitting: Emergency Medicine

## 2016-03-30 ENCOUNTER — Encounter (HOSPITAL_COMMUNITY): Payer: Self-pay | Admitting: Emergency Medicine

## 2016-03-30 DIAGNOSIS — Z87891 Personal history of nicotine dependence: Secondary | ICD-10-CM | POA: Insufficient documentation

## 2016-03-30 DIAGNOSIS — Z7984 Long term (current) use of oral hypoglycemic drugs: Secondary | ICD-10-CM | POA: Diagnosis not present

## 2016-03-30 DIAGNOSIS — R339 Retention of urine, unspecified: Secondary | ICD-10-CM | POA: Diagnosis not present

## 2016-03-30 DIAGNOSIS — E119 Type 2 diabetes mellitus without complications: Secondary | ICD-10-CM | POA: Diagnosis not present

## 2016-03-30 DIAGNOSIS — Z7982 Long term (current) use of aspirin: Secondary | ICD-10-CM | POA: Insufficient documentation

## 2016-03-30 DIAGNOSIS — R338 Other retention of urine: Secondary | ICD-10-CM

## 2016-03-30 DIAGNOSIS — I10 Essential (primary) hypertension: Secondary | ICD-10-CM | POA: Insufficient documentation

## 2016-03-30 NOTE — ED Provider Notes (Signed)
CSN: [id number]     Arrival date & time 03/30/16  0620 History   First MD Initiated Contact with Patient 03/30/16 [phone]     Chief Complaint  Patient presents with  . Urinary Retention     (Consider location/radiation/quality/duration/timing/severity/associated sxs/prior Treatment) The history is provided by the patient.  82 year old male with history of diabetes, hypertension had an indwelling Foley catheter. It came out yesterday. Wife states that the balloon did not appear to be inflated, and it came out. Since then, he has been unable to urinate. He denies fever, chills, sweats. He denies abdominal pain or back pain. There is been no nausea or vomiting.  Past Medical History  Diagnosis Date  . Hypertension   . Impaired glucose tolerance   . 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 Colon polyps 2011    Non cancerous   . Diverticulosis   . Elevated PSA 2010    dr. Neoma Lamingkrishan follows   . Diabetes mellitus    Past Surgical History  Procedure Laterality Date  . Removal of benign mole      15 years ago   Family History  Problem Relation Age of Onset  . Heart failure Mother   . Pneumonia Father   . Coronary artery disease Brother     bowel obstruction  . Pneumonia Sister   . Stroke Sister   . Aneurysm Sister    Social History  Substance Use Topics  . Smoking status: Former Smoker    Types: Cigarettes  . Smokeless tobacco: None  . Alcohol Use: No    Review of Systems  All other systems reviewed and are negative.     Allergies  Penicillins  Home Medications   Prior to Admission medications   Medication Sig Start Date End Date Taking? Authorizing Provider  apixaban (ELIQUIS) 5 MG TABS tablet Take 5 mg by mouth 2 (two) times daily.   Yes Historical Provider, MD  finasteride (PROSCAR) 5 MG tablet Take 5 mg by mouth daily.   Yes Historical Provider, MD  lisinopril (PRINIVIL,ZESTRIL) 40 MG tablet Take 40 mg by mouth daily.   Yes Historical Provider, MD  metFORMIN  (GLUCOPHAGE) 500 MG tablet Take by mouth daily with breakfast.   Yes Historical Provider, MD  tamsulosin (FLOMAX) 0.4 MG CAPS capsule Take 0.4 mg by mouth.   Yes Historical Provider, MD  amLODipine (NORVASC) 10 MG tablet TAKE ONE TABLET BY MOUTH EVERY DAY 06/20/13   Kerri PerchesMargaret E Simpson, MD  aspirin (ASPIRIN LOW DOSE) 81 MG EC tablet Take 81 mg by mouth daily.      Historical Provider, MD   BP 183/119 mmHg  Pulse 104  Temp(Src) 97.5 F (36.4 C) (Oral)  Resp 22  Ht 6\' 1"  (1.854 m)  Wt 180 lb (81.647 kg)  BMI 23.75 kg/m2  SpO2 100% Physical Exam  Nursing note and vitals reviewed.  82 year old male, resting comfortably and in no acute distress. Vital signs are significant for hypertension, tachycardia, tachypnea. Oxygen saturation is 100%, which is normal. Head is normocephalic and atraumatic. PERRLA, EOMI. Oropharynx is clear. Neck is nontender and supple without adenopathy or JVD. Back is nontender and there is no CVA tenderness. Lungs are clear without rales, wheezes, or rhonchi. Chest is nontender. Heart has regular rate and rhythm without murmur. Abdomen is soft, flat, nontender. Bladder is distended, but there are no other masses or hepatosplenomegaly and peristalsis is normoactive. Extremities have no cyanosis or edema, full range of motion is  present. Skin is warm and dry without rash. Neurologic: Mental status is normal, cranial nerves are intact, there are no motor or sensory deficits.  ED Course  Procedures (including critical care time)   MDM   Final diagnoses:  Acute urinary retention    Acute urinary retention in a patient who is apparently Foley catheter dependent. New Foley catheter is placed, which drained 1000  Milliliters of urine. He is discharged with instructions to follow up with PCP.    Dione Booze, MD 03/30/16 [phone]

## 2016-03-30 NOTE — ED Notes (Signed)
Pt states his catheter came out yesterday at home and since then he states he has urinated very little.

## 2016-03-30 NOTE — Discharge Instructions (Signed)
Acute Urinary Retention, Male °Acute urinary retention is the temporary inability to urinate. °This is a common problem in older men. As men age their prostates become larger and block the flow of urine from the bladder. This is usually a problem that has come on gradually.  °HOME CARE INSTRUCTIONS °If you are sent home with a Foley catheter and a drainage system, you will need to discuss the best course of action with your health care provider. While the catheter is in, maintain a good intake of fluids. Keep the drainage bag emptied and lower than your catheter. This is so that contaminated urine will not flow back into your bladder, which could lead to a urinary tract infection. °There are two main types of drainage bags. One is a large bag that usually is used at night. It has a good capacity that will allow you to sleep through the night without having to empty it. The second type is called a leg bag. It has a smaller capacity, so it needs to be emptied more frequently. However, the main advantage is that it can be attached by a leg strap and can go underneath your clothing, allowing you the freedom to move about or leave your home. °Only take over-the-counter or prescription medicines for pain, discomfort, or fever as directed by your health care provider.  °SEEK MEDICAL CARE IF: °· You develop a low-grade fever. °· You experience spasms or leakage of urine with the spasms. °SEEK IMMEDIATE MEDICAL CARE IF:  °1. You develop chills or fever. °2. Your catheter stops draining urine. °3. Your catheter falls out. °4. You start to develop increased bleeding that does not respond to rest and increased fluid intake. °MAKE SURE YOU: °1. Understand these instructions. °2. Will watch your condition. °3. Will get help right away if you are not doing well or get worse. °  °This information is not intended to replace advice given to you by your health care provider. Make sure you discuss any questions you have with your  health care provider. °  °Document Released: 03/13/2001 Document Revised: 04/21/2015 Document Reviewed: 05/16/2013 °Elsevier Interactive Patient Education ©2016 [employer] ° ° ° ° °Foley Catheter Care, Adult °A Foley catheter is a soft, flexible tube that is placed into the bladder to drain urine. A Foley catheter may be inserted if: °· You leak urine or are not able to control when you urinate (urinary incontinence). °· You are not able to urinate when you need to (urinary retention). °· You had prostate surgery or surgery on the genitals. °· You have certain medical conditions, such as multiple sclerosis, dementia, or a spinal cord injury. °If you are going home with a Foley catheter in place, follow the instructions below. °TAKING CARE OF THE CATHETER °5. Wash your hands with soap and water. °6. Using mild soap and warm water on a clean washcloth: °¨ Clean the area on your body closest to the catheter insertion site using a circular motion, moving away from the catheter. Never wipe toward the catheter because this could sweep bacteria up into the urethra and cause infection. °¨ Remove all traces of soap. Pat the area dry with a clean towel. For males, reposition the foreskin. °7. Attach the catheter to your leg so there is no tension on the catheter. Use adhesive tape or a leg strap. If you are using adhesive tape, remove any sticky residue left behind by the previous tape you used. °8. Keep the drainage bag below the level of   the bladder, but keep it off the floor. °9. Check throughout the day to be sure the catheter is working and urine is draining freely. Make sure the tubing does not become kinked. °10. Do not pull on the catheter or try to remove it. Pulling could damage internal tissues. °TAKING CARE OF THE DRAINAGE BAGS °You will be given two drainage bags to take home. One is a large overnight drainage bag, and the other is a smaller leg bag that fits underneath clothing. You may wear the overnight bag  at any time, but you should never wear the smaller leg bag at night. Follow the instructions below for how to empty, change, and clean your drainage bags. °Emptying the Drainage Bag °You must empty your drainage bag when it is  -½ full or at least 2-3 times a day. °4. Wash your hands with soap and water. °5. Keep the drainage bag below your hips, below the level of your bladder. This stops urine from going back into the tubing and into your bladder. °6. Hold the dirty bag over the toilet or a clean container. °7. Open the pour spout at the bottom of the bag and empty the urine into the toilet or container. Do not let the pour spout touch the toilet, container, or any other surface. Doing so can place bacteria on the bag, which can cause an infection. °8. Clean the pour spout with a gauze pad or cotton ball that has rubbing alcohol on it. °9. Close the pour spout. °10. Attach the bag to your leg with adhesive tape or a leg strap. °11. Wash your hands well. °Changing the Drainage Bag °Change your drainage bag once a month or sooner if it starts to smell bad or look dirty. Below are steps to follow when changing the drainage bag. °1. Wash your hands with soap and water. °2. Pinch off the rubber catheter so that urine does not spill out. °3. Disconnect the catheter tube from the drainage tube at the connection valve. Do not let the tubes touch any surface. °4. Clean the end of the catheter tube with an alcohol wipe. Use a different alcohol wipe to clean the end of the drainage tube. °5. Connect the catheter tube to the drainage tube of the clean drainage bag. °6. Attach the new bag to the leg with adhesive tape or a leg strap. Avoid attaching the new bag too tightly. °7. Wash your hands well. °Cleaning the Drainage Bag °1. Wash your hands with soap and water. °2. Wash the bag in warm, soapy water. °3. Rinse the bag thoroughly with warm water. °4. Fill the bag with a solution of white vinegar and water (1 cup vinegar to  1 qt warm water [.2 L vinegar to 1 L warm water]). Close the bag and soak it for 30 minutes in the solution. °5. Rinse the bag with warm water. °6. Hang the bag to dry with the pour spout open and hanging downward. °7. Store the clean bag (once it is dry) in a clean plastic bag. °8. Wash your hands well. °PREVENTING INFECTION °· Wash your hands before and after handling your catheter. °· Take showers daily and wash the area where the catheter enters your body. Do not take baths. Replace wet leg straps with dry ones, if this applies. °· Do not use powders, sprays, or lotions on the genital area. Only use creams, lotions, or ointments as directed by your caregiver. °· For females, wipe from front to back after   each bowel movement. °· Drink enough fluids to keep your urine clear or pale yellow unless you have a fluid restriction. °· Do not let the drainage bag or tubing touch or lie on the floor. °· Wear cotton underwear to absorb moisture and to keep your skin drier. °SEEK MEDICAL CARE IF:  °· Your urine is cloudy or smells unusually bad. °· Your catheter becomes clogged. °· You are not draining urine into the bag or your bladder feels full. °· Your catheter starts to leak. °SEEK IMMEDIATE MEDICAL CARE IF:  °· You have pain, swelling, redness, or pus where the catheter enters the body. °· You have pain in the abdomen, legs, lower back, or bladder. °· You have a fever. °· You see blood fill the catheter, or your urine is pink or red. °· You have nausea, vomiting, or chills. °· Your catheter gets pulled out. °MAKE SURE YOU:  °· Understand these instructions. °· Will watch your condition. °· Will get help right away if you are not doing well or get worse. °  °This information is not intended to replace advice given to you by your health care provider. Make sure you discuss any questions you have with your health care provider. °  °Document Released: 12/05/2005 Document Revised: 04/21/2014 Document Reviewed:  11/26/2012 °Elsevier Interactive Patient Education ©2016 [employer] Inc[employer] ° °

## 2016-05-02 ENCOUNTER — Emergency Department (HOSPITAL_COMMUNITY)
Admission: EM | Admit: 2016-05-02 | Discharge: 2016-05-02 | Disposition: A | Discharge status: Home / Self Care | Payer: Medicare Other | Attending: Emergency Medicine | Admitting: Emergency Medicine

## 2016-05-02 ENCOUNTER — Encounter (HOSPITAL_COMMUNITY): Payer: Self-pay

## 2016-05-02 DIAGNOSIS — E119 Type 2 diabetes mellitus without complications: Secondary | ICD-10-CM | POA: Insufficient documentation

## 2016-05-02 DIAGNOSIS — Z7984 Long term (current) use of oral hypoglycemic drugs: Secondary | ICD-10-CM | POA: Insufficient documentation

## 2016-05-02 DIAGNOSIS — Z87891 Personal history of nicotine dependence: Secondary | ICD-10-CM | POA: Diagnosis not present

## 2016-05-02 DIAGNOSIS — I10 Essential (primary) hypertension: Secondary | ICD-10-CM | POA: Diagnosis not present

## 2016-05-02 DIAGNOSIS — Z79899 Other long term (current) drug therapy: Secondary | ICD-10-CM | POA: Diagnosis not present

## 2016-05-02 DIAGNOSIS — R339 Retention of urine, unspecified: Secondary | ICD-10-CM | POA: Insufficient documentation

## 2016-05-02 LAB — URINE MICROSCOPIC-ADD ON

## 2016-05-02 LAB — URINALYSIS, ROUTINE W REFLEX MICROSCOPIC
BILIRUBIN URINE: NEGATIVE
Glucose, UA: NEGATIVE mg/dL
KETONES UR: NEGATIVE mg/dL
LEUKOCYTES UA: NEGATIVE
NITRITE: POSITIVE — AB
PROTEIN: NEGATIVE mg/dL
Specific Gravity, Urine: 1.01 (ref 1.005–1.030)
pH: 5.5 (ref 5.0–8.0)

## 2016-05-02 NOTE — ED Provider Notes (Signed)
CSN: [id number]     Arrival date & time 05/02/16  1637 History   First MD Initiated Contact with Patient 05/02/16 1717     Chief Complaint  Patient presents with  . Urinary Retention     (Consider location/radiation/quality/duration/timing/severity/associated sxs/prior Treatment) HPI....Marland Kitchen.Marland Kitchen.Urinary retention since this morning. Patient had a Foley catheter in place, but it  came out earlier today. He's been unable to urinate since that time. He has on known [facility]  No fever, chills, flank pain, dysuria. Patient is very uncomfortable.  Past Medical History  Diagnosis Date  . Hypertension   . Impaired glucose tolerance   . 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 Colon polyps 2011    Non cancerous   . Diverticulosis   . Elevated PSA 2010    dr. Neoma Lamingkrishan follows   . Diabetes mellitus    Past Surgical History  Procedure Laterality Date  . Removal of benign mole      15 years ago  . Cystostomy w/ bladder biopsy     Family History  Problem Relation Age of Onset  . Heart failure Mother   . Pneumonia Father   . Coronary artery disease Brother     bowel obstruction  . Pneumonia Sister   . Stroke Sister   . Aneurysm Sister    Social History  Substance Use Topics  . Smoking status: Former Smoker    Types: Cigarettes  . Smokeless tobacco: None  . Alcohol Use: No    Review of Systems  All other systems reviewed and are negative.     Allergies  Penicillins  Home Medications   Prior to Admission medications   Medication Sig Start Date End Date Taking? Authorizing Provider  apixaban (ELIQUIS) 5 MG TABS tablet Take 5 mg by mouth 2 (two) times daily.   Yes Historical Provider, MD  cholecalciferol (VITAMIN D) 1000 units tablet Take 1,000 Units by mouth daily.   Yes Historical Provider, MD  Cyanocobalamin (B-12 PO) Take 1 tablet by mouth daily.   Yes Historical Provider, MD  finasteride (PROSCAR) 5 MG tablet Take 5 mg by mouth  daily.   Yes Historical Provider, MD  lisinopril (PRINIVIL,ZESTRIL) 40 MG tablet Take 40 mg by mouth daily.   Yes Historical Provider, MD  metFORMIN (GLUCOPHAGE) 500 MG tablet Take 500 mg by mouth daily.    Yes Historical Provider, MD  tamsulosin (FLOMAX) 0.4 MG CAPS capsule Take 0.4 mg by mouth daily after supper.    Yes Historical Provider, MD  thiamine (VITAMIN B-1) 100 MG tablet Take 100 mg by mouth daily.   Yes Historical Provider, MD   BP 163/86 mmHg  Pulse 69  Temp(Src) 97.9 F (36.6 C) (Oral)  Resp 18  Ht 6\' 1"  (1.854 m)  Wt 190 lb (86.183 kg)  BMI 25.07 kg/m2  SpO2 100% Physical Exam  Constitutional: He is oriented to person, place, and time. He appears well-developed and well-nourished.  HENT:  Head: Normocephalic and atraumatic.  Eyes: Conjunctivae and EOM are normal. Pupils are equal, round, and reactive to light.  Neck: Normal range of motion. Neck supple.  Cardiovascular: Normal rate and regular rhythm.   Pulmonary/Chest: Effort normal and breath sounds normal.  Abdominal: Soft. Bowel sounds are normal.  Tender suprapubic area  Musculoskeletal: Normal range of motion.  Neurological: He is alert and oriented to person, place, and time.  Skin: Skin is warm and dry.  Psychiatric: He has  a normal mood and affect. His behavior is normal.  Nursing note and vitals reviewed.   ED Course  Procedures (including critical care time) Labs Review Labs Reviewed  URINALYSIS, ROUTINE W REFLEX MICROSCOPIC (NOT AT [facility]) - Abnormal; Notable for the following:    Hgb urine dipstick SMALL (*)    Nitrite POSITIVE (*)    All other components within normal limits  URINE MICROSCOPIC-ADD ON - Abnormal; Notable for the following:    Squamous Epithelial / LPF 0-5 (*)    Bacteria, UA MANY (*)    All other components within normal limits    Imaging Review No results found. I have personally reviewed and evaluated these images and lab results as part of my medical decision-making.    EKG Interpretation None      MDM   Final diagnoses:  Urinary retention    Foley catheter placed with excellent urinary output. Patient will keep Foley catheter in place and follow-up with his urologist. Urine culture pending    Donnetta Hutching, MD 05/02/16 2033

## 2016-05-02 NOTE — Discharge Instructions (Signed)
Urinalysis showed no evidence of infection. Keep Foley catheter in. Follow-up with urologist.

## 2016-05-02 NOTE — ED Notes (Signed)
Pt reports that foley cathter balloon ruptured  And foley came out. Unable to urinate  And here to have foley replaced

## 2016-05-02 NOTE — ED Notes (Signed)
Pt states understanding of care given and follow up instructions. Pt requested to keep standard drainage bag.  Informed pt to follow up with urology

## 2016-05-27 ENCOUNTER — Emergency Department (HOSPITAL_COMMUNITY)
Admission: EM | Admit: 2016-05-27 | Discharge: 2016-05-27 | Disposition: A | Discharge status: Home / Self Care | Payer: Medicare Other | Attending: Emergency Medicine | Admitting: Emergency Medicine

## 2016-05-27 ENCOUNTER — Encounter (HOSPITAL_COMMUNITY): Payer: Self-pay | Admitting: Cardiology

## 2016-05-27 DIAGNOSIS — Z87891 Personal history of nicotine dependence: Secondary | ICD-10-CM | POA: Insufficient documentation

## 2016-05-27 DIAGNOSIS — E119 Type 2 diabetes mellitus without complications: Secondary | ICD-10-CM | POA: Diagnosis not present

## 2016-05-27 DIAGNOSIS — Z79899 Other long term (current) drug therapy: Secondary | ICD-10-CM | POA: Insufficient documentation

## 2016-05-27 DIAGNOSIS — Z7984 Long term (current) use of oral hypoglycemic drugs: Secondary | ICD-10-CM | POA: Diagnosis not present

## 2016-05-27 DIAGNOSIS — I10 Essential (primary) hypertension: Secondary | ICD-10-CM | POA: Diagnosis not present

## 2016-05-27 DIAGNOSIS — R339 Retention of urine, unspecified: Secondary | ICD-10-CM | POA: Diagnosis not present

## 2016-05-27 LAB — URINALYSIS, ROUTINE W REFLEX MICROSCOPIC
Bilirubin Urine: NEGATIVE
Glucose, UA: NEGATIVE mg/dL
Ketones, ur: NEGATIVE mg/dL
Leukocytes, UA: NEGATIVE
Nitrite: NEGATIVE
PH: 6 (ref 5.0–8.0)
PROTEIN: NEGATIVE mg/dL
Specific Gravity, Urine: <1.005 — ABNORMAL LOW (ref 1.005–1.030)

## 2016-05-27 LAB — URINE MICROSCOPIC-ADD ON
BACTERIA UA: NONE SEEN
WBC, UA: NONE SEEN WBC/hpf (ref 0–5)

## 2016-05-27 MED ORDER — MORPHINE SULFATE (PF) 4 MG/ML IV SOLN
4.0000 mg | Freq: Once | INTRAVENOUS | Status: AC
  Administered 2016-05-27: 4 mg via INTRAVENOUS
  Filled 2016-05-27: qty 1

## 2016-05-27 MED ORDER — LIDOCAINE HCL 2 % EX GEL
1.0000 "application " | Freq: Once | CUTANEOUS | Status: AC
  Administered 2016-05-27: 1 via URETHRAL
  Filled 2016-05-27: qty 10

## 2016-05-27 NOTE — ED Provider Notes (Signed)
CSN: [id number]     Arrival date & time 05/27/16  1758 History   First MD Initiated Contact with Patient 05/27/16 1809     Chief Complaint  Patient presents with  . Urinary Retention     (Consider location/radiation/quality/duration/timing/severity/associated sxs/prior Treatment) HPI...Marland Kitchen.Marland Kitchen.Patient has known prostatic hypertrophy. [facility] and a F[facility] catheter was placed. Apparently the catheter slid out of place today. He states he does not have prostate cancer. No fever, sweats, chills, dysuria, flank pain. Patient feels a need to urinate. Severity symptoms is moderate.  Past Medical History  Diagnosis Date  . Hypertension   . Impaired glucose tolerance   . 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 Colon polyps 2011    Non cancerous   . Diverticulosis   . Elevated PSA 2010    dr. Neoma Lamingkrishan follows   . Diabetes mellitus    Past Surgical History  Procedure Laterality Date  . Removal of benign mole      15 years ago  . Cystostomy w/ bladder biopsy     Family History  Problem Relation Age of Onset  . Heart failure Mother   . Pneumonia Father   . Coronary artery disease Brother     bowel obstruction  . Pneumonia Sister   . Stroke Sister   . Aneurysm Sister    Social History  Substance Use Topics  . Smoking status: Former Smoker    Types: Cigarettes  . Smokeless tobacco: None  . Alcohol Use: No    Review of Systems  All other systems reviewed and are negative.     Allergies  Penicillins  Home Medications   Prior to Admission medications   Medication Sig Start Date End Date Taking? Authorizing Provider  apixaban (ELIQUIS) 5 MG TABS tablet Take 5 mg by mouth 2 (two) times daily.   Yes Historical Provider, MD  cholecalciferol (VITAMIN D) 1000 units tablet Take 1,000 Units by mouth daily.   Yes Historical Provider, MD  Cyanocobalamin (B-12 PO) Take 1 tablet by mouth daily.   Yes Historical Provider, MD  finasteride (PROSCAR) 5 MG  tablet Take 5 mg by mouth daily.   Yes Historical Provider, MD  lisinopril (PRINIVIL,ZESTRIL) 40 MG tablet Take 40 mg by mouth daily.   Yes Historical Provider, MD  metFORMIN (GLUCOPHAGE) 500 MG tablet Take 500 mg by mouth daily.    Yes Historical Provider, MD  tamsulosin (FLOMAX) 0.4 MG CAPS capsule Take 0.4 mg by mouth daily after supper.    Yes Historical Provider, MD  thiamine (VITAMIN B-1) 100 MG tablet Take 100 mg by mouth daily.   Yes Historical Provider, MD   BP 146/81 mmHg  Pulse 69  Temp(Src) 98.7 F (37.1 C) (Oral)  Resp 16  Ht 6\' 2"  (1.88 m)  Wt 192 lb 11.2 oz (87.408 kg)  BMI 24.73 kg/m2  SpO2 98% Physical Exam  Constitutional: He is oriented to person, place, and time. He appears well-developed and well-nourished.  HENT:  Head: Normocephalic and atraumatic.  Eyes: Conjunctivae and EOM are normal. Pupils are equal, round, and reactive to light.  Neck: Normal range of motion. Neck supple.  Cardiovascular: Normal rate and regular rhythm.   Pulmonary/Chest: Effort normal and breath sounds normal.  Abdominal: Soft. Bowel sounds are normal.  Minimal suprapubic tenderness  Musculoskeletal: Normal range of motion.  Neurological: He is alert and oriented to person, place, and time.  Skin: Skin is warm and dry.  Psychiatric: He has a normal mood and affect. His behavior is normal.  Nursing note and vitals reviewed.   ED Course  Procedures (including critical care time) Labs Review Labs Reviewed  URINALYSIS, ROUTINE W REFLEX MICROSCOPIC (NOT AT [facility]) - Abnormal; Notable for the following:    Specific Gravity, Urine <1.005 (*)    Hgb urine dipstick SMALL (*)    All other components within normal limits  URINE MICROSCOPIC-ADD ON - Abnormal; Notable for the following:    Squamous Epithelial / LPF 0-5 (*)    All other components within normal limits    Imaging Review No results found. I have personally reviewed and evaluated these images and lab results as part of my  medical decision-making.   EKG Interpretation None      MDM   Final diagnoses:  Urinary retention    Foley catheter placed with good urinary return. Minimal urethral bleeding noted.  Urinalysis shows no obvious infection. Will follow-up with urologist.    Donnetta Hutching, MD 05/27/16 2351

## 2016-05-27 NOTE — ED Notes (Signed)
Foley catheter came out.  [facility]

## 2016-05-27 NOTE — Discharge Instructions (Signed)
Keep Foley catheter in until seen by urologist.

## 2016-06-27 ENCOUNTER — Emergency Department (HOSPITAL_COMMUNITY)
Admission: EM | Admit: 2016-06-27 | Discharge: 2016-06-27 | Disposition: A | Discharge status: Home / Self Care | Payer: Medicare Other | Attending: Emergency Medicine | Admitting: Emergency Medicine

## 2016-06-27 ENCOUNTER — Encounter (HOSPITAL_COMMUNITY): Payer: Self-pay | Admitting: Emergency Medicine

## 2016-06-27 DIAGNOSIS — Z7984 Long term (current) use of oral hypoglycemic drugs: Secondary | ICD-10-CM | POA: Diagnosis not present

## 2016-06-27 DIAGNOSIS — R339 Retention of urine, unspecified: Secondary | ICD-10-CM | POA: Diagnosis not present

## 2016-06-27 DIAGNOSIS — Z79899 Other long term (current) drug therapy: Secondary | ICD-10-CM | POA: Insufficient documentation

## 2016-06-27 DIAGNOSIS — E119 Type 2 diabetes mellitus without complications: Secondary | ICD-10-CM | POA: Diagnosis not present

## 2016-06-27 DIAGNOSIS — R109 Unspecified abdominal pain: Secondary | ICD-10-CM | POA: Diagnosis not present

## 2016-06-27 DIAGNOSIS — I10 Essential (primary) hypertension: Secondary | ICD-10-CM | POA: Insufficient documentation

## 2016-06-27 DIAGNOSIS — Z87891 Personal history of nicotine dependence: Secondary | ICD-10-CM | POA: Insufficient documentation

## 2016-06-27 NOTE — ED Notes (Signed)
Patient verbalizes understanding of discharge instructions, prescriptions, home care and follow up care. Patient out of department at this time. 

## 2016-06-27 NOTE — ED Provider Notes (Signed)
CSN: [id number]     Arrival date & time 06/27/16  1745 History   First MD Initiated Contact with Patient 06/27/16 1926     Chief Complaint  Patient presents with  . Urinary Retention     (Consider location/radiation/quality/duration/timing/severity/associated sxs/prior Treatment) Patient is a 82 y.o. male presenting with general illness. The history is provided by the patient (Patient has urinary retention in his Foley has come out).  Illness Severity:  Moderate Onset quality:  Sudden Timing:  Constant Progression:  Waxing and waning Chronicity:  Recurrent Context:  Foley has come out of his bladder Associated symptoms: abdominal pain   Associated symptoms: no chest pain, no congestion, no cough, no diarrhea, no fatigue, no headaches and no rash     Past Medical History  Diagnosis Date  . Hypertension   . Impaired glucose tolerance   . 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 Colon polyps 2011    Non cancerous   . Diverticulosis   . Elevated PSA 2010    dr. Neoma Lamingkrishan follows   . Diabetes mellitus    Past Surgical History  Procedure Laterality Date  . Removal of benign mole      15 years ago  . Cystostomy w/ bladder biopsy     Family History  Problem Relation Age of Onset  . Heart failure Mother   . Pneumonia Father   . Coronary artery disease Brother     bowel obstruction  . Pneumonia Sister   . Stroke Sister   . Aneurysm Sister    Social History  Substance Use Topics  . Smoking status: Former Smoker    Types: Cigarettes  . Smokeless tobacco: None  . Alcohol Use: No    Review of Systems  Constitutional: Negative for appetite change and fatigue.  HENT: Negative for congestion, ear discharge and sinus pressure.   Eyes: Negative for discharge.  Respiratory: Negative for cough.   Cardiovascular: Negative for chest pain.  Gastrointestinal: Positive for abdominal pain. Negative for diarrhea.  Genitourinary: Negative for frequency and hematuria.  Musculoskeletal:  Negative for back pain.  Skin: Negative for rash.  Neurological: Negative for seizures and headaches.  Psychiatric/Behavioral: Negative for hallucinations.      Allergies  Penicillins  Home Medications   Prior to Admission medications   Medication Sig Start Date End Date Taking? Authorizing Provider  apixaban (ELIQUIS) 5 MG TABS tablet Take 5 mg by mouth 2 (two) times daily.    Historical Provider, MD  cholecalciferol (VITAMIN D) 1000 units tablet Take 1,000 Units by mouth daily.    Historical Provider, MD  Cyanocobalamin (B-12 PO) Take 1 tablet by mouth daily.    Historical Provider, MD  finasteride (PROSCAR) 5 MG tablet Take 5 mg by mouth daily.    Historical Provider, MD  lisinopril (PRINIVIL,ZESTRIL) 40 MG tablet Take 40 mg by mouth daily.    Historical Provider, MD  metFORMIN (GLUCOPHAGE) 500 MG tablet Take 500 mg by mouth daily.     Historical Provider, MD  tamsulosin (FLOMAX) 0.4 MG CAPS capsule Take 0.4 mg by mouth daily after supper.     Historical Provider, MD  thiamine (VITAMIN B-1) 100 MG tablet Take 100 mg by mouth daily.    Historical Provider, MD   BP 159/95 mmHg  Pulse 81  Temp(Src) 99 F (37.2 C) (Oral)  Resp 20  Wt 192 lb (87.091 kg)  SpO2 99% Physical Exam  Constitutional: He is oriented to person, place, and time. He appears  well-developed.  HENT:  Head: Normocephalic.  Eyes: Conjunctivae and EOM are normal. No scleral icterus.  Neck: Neck supple. No thyromegaly present.  Cardiovascular: Normal rate and regular rhythm.  Exam reveals no gallop and no friction rub.   No murmur heard. Pulmonary/Chest: No stridor. He has no wheezes. He has no rales. He exhibits no tenderness.  Abdominal: He exhibits no distension. There is tenderness. There is no rebound.  Mild distended abdomen  Musculoskeletal: Normal range of motion. He exhibits no edema.  Lymphadenopathy:    He has no cervical adenopathy.  Neurological: He is oriented to person, place, and time. He  exhibits normal muscle tone. Coordination normal.  Skin: No rash noted. No erythema.  Psychiatric: He has a normal mood and affect. His behavior is normal.    ED Course  Procedures (including critical care time) Labs Review Labs Reviewed - No data to display  Imaging Review No results found. I have personally reviewed and evaluated these images and lab results as part of my medical decision-making.   EKG Interpretation None      MDM   Final diagnoses:  Urinary retention    Patient with urinary retention. Patient Foley placed. He will follow-up with Dr.    Bethann Berkshire, MD 06/27/16 2040

## 2016-06-27 NOTE — Discharge Instructions (Signed)
Follow up with your md as planned °

## 2016-06-27 NOTE — ED Notes (Signed)
Pt states his foley fell out today.  States it was placed this morning.  Denies symptoms.  States this is the 4th time this has happened.

## 2016-07-12 ENCOUNTER — Encounter (HOSPITAL_COMMUNITY): Payer: Self-pay | Admitting: Emergency Medicine

## 2016-07-12 ENCOUNTER — Emergency Department (HOSPITAL_COMMUNITY)
Admission: EM | Admit: 2016-07-12 | Discharge: 2016-07-12 | Disposition: A | Discharge status: Home / Self Care | Payer: Medicare Other | Attending: Emergency Medicine | Admitting: Emergency Medicine

## 2016-07-12 DIAGNOSIS — R339 Retention of urine, unspecified: Secondary | ICD-10-CM

## 2016-07-12 DIAGNOSIS — I10 Essential (primary) hypertension: Secondary | ICD-10-CM | POA: Insufficient documentation

## 2016-07-12 DIAGNOSIS — E119 Type 2 diabetes mellitus without complications: Secondary | ICD-10-CM | POA: Insufficient documentation

## 2016-07-12 DIAGNOSIS — E785 Hyperlipidemia, unspecified: Secondary | ICD-10-CM | POA: Insufficient documentation

## 2016-07-12 DIAGNOSIS — Z7984 Long term (current) use of oral hypoglycemic drugs: Secondary | ICD-10-CM | POA: Diagnosis not present

## 2016-07-12 DIAGNOSIS — Z87891 Personal history of nicotine dependence: Secondary | ICD-10-CM | POA: Diagnosis not present

## 2016-07-12 DIAGNOSIS — N390 Urinary tract infection, site not specified: Secondary | ICD-10-CM

## 2016-07-12 LAB — URINALYSIS, ROUTINE W REFLEX MICROSCOPIC
Bilirubin Urine: NEGATIVE
GLUCOSE, UA: NEGATIVE mg/dL
Ketones, ur: NEGATIVE mg/dL
Nitrite: POSITIVE — AB
PROTEIN: 30 mg/dL — AB
SPECIFIC GRAVITY, URINE: 1.01 (ref 1.005–1.030)
pH: 7.5 (ref 5.0–8.0)

## 2016-07-12 LAB — URINE MICROSCOPIC-ADD ON: SQUAMOUS EPITHELIAL / LPF: NONE SEEN

## 2016-07-12 MED ORDER — CIPROFLOXACIN HCL 500 MG PO TABS: 500.0000 mg | ORAL_TABLET | Freq: Two times a day (BID) | ORAL | 0 refills | Status: DC

## 2016-07-12 MED ORDER — CIPROFLOXACIN HCL 250 MG PO TABS
500.0000 mg | ORAL_TABLET | Freq: Once | ORAL | Status: AC
  Administered 2016-07-12: 500 mg via ORAL
  Filled 2016-07-12: qty 2

## 2016-07-12 NOTE — ED Provider Notes (Signed)
[facility]Y DEPT Provider Note   CSN: [id number] Arrival date & time: 07/12/16  5790  First Provider Contact:  First MD Initiated Contact with Patient 07/12/16 04:30 AM       History   Chief Complaint Chief Complaint  Patient presents with  . Urinary problems    HPI Dustin Chandler is a 82 y.o. male.  HPI patient reports he's had a Foley since February and he is considering whether he wants surgery or not. He is followed at the [facility] in [place]. He states he noted his urine started getting cloudy over the last 3-4 days. He had dropped his Flomax from twice a day to once a day. He states tonight the bag stopped draining when he changed from the daytime back to the overnight bag. He denies nausea or vomiting but states he did have abdominal pain before the nursing staff changed his catheter and he was diaphoretic.   PCP Dr Berenice Primas [facility]  Past Medical History:  Diagnosis Date  . Colon polyps 2011   Non cancerous   . Diabetes mellitus   . Diverticulosis   . Elevated PSA 2010   dr. Neoma Laming follows   . Hypertension   . Impaired glucose tolerance   . 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 Dyslipidemia 11/07/2012  . Routine general medical examination at a health care facility 07/01/2012  . DM (diabetes mellitus) (HCC) 01/01/2012  . ALLERGIC RHINITIS 02/11/2010  . PROSTATE SPECIFIC ANTIGEN, ELEVATED 09/20/2009  . Overweight (BMI 25.0-29.9) 07/18/2009  . FATIGUE 07/17/2009  . HYPERTENSION 07/18/2008    Past Surgical History:  Procedure Laterality Date  . CYSTOSTOMY W/ BLADDER BIOPSY    . removal of benign mole     15 years ago       Home Medications    Prior to Admission medications   Medication Sig Start Date End Date Taking? Authorizing Provider  apixaban (ELIQUIS) 5 MG TABS tablet Take 5 mg by mouth 2 (two) times daily.    Historical Provider, MD  cholecalciferol (VITAMIN D) 1000 units  tablet Take 1,000 Units by mouth daily.    Historical Provider, MD  ciprofloxacin (CIPRO) 500 MG tablet Take 1 tablet (500 mg total) by mouth 2 (two) times daily. 07/12/16   Devoria Albe, MD  Cyanocobalamin (B-12 PO) Take 1 tablet by mouth daily.    Historical Provider, MD  finasteride (PROSCAR) 5 MG tablet Take 5 mg by mouth daily.    Historical Provider, MD  lisinopril (PRINIVIL,ZESTRIL) 40 MG tablet Take 40 mg by mouth daily.    Historical Provider, MD  metFORMIN (GLUCOPHAGE) 500 MG tablet Take 500 mg by mouth daily.     Historical Provider, MD  tamsulosin (FLOMAX) 0.4 MG CAPS capsule Take 0.4 mg by mouth daily after supper.     Historical Provider, MD  thiamine (VITAMIN B-1) 100 MG tablet Take 100 mg by mouth daily.    Historical Provider, MD    Family History Family History  Problem Relation Age of Onset  . Heart failure Mother   . Pneumonia Father   . Coronary artery disease Brother     bowel obstruction  . Pneumonia Sister   . Stroke Sister   . Aneurysm Sister     Social History Social History  Substance Use Topics  . Smoking status: Former Smoker    Types: Cigarettes  . Smokeless tobacco: Never Used  .  Alcohol use No  lives at home Lives with spouse   Allergies   Penicillins   Review of Systems Review of Systems  All other systems reviewed and are negative.    Physical Exam Updated Vital Signs BP 145/83   Pulse 70   Temp 98.1 F (36.7 C) (Oral)   Resp 20   Ht 6\' 1"  (1.854 m)   Wt 192 lb (87.1 kg)   SpO2 96%   BMI 25.33 kg/m   Vital signs normal    Physical Exam  Constitutional: He is oriented to person, place, and time. He appears well-developed and well-nourished.  Non-toxic appearance. He does not appear ill. No distress.  Patient examined after nursing changed his Foley catheter  HENT:  Head: Normocephalic and atraumatic.  Right Ear: External ear normal.  Left Ear: External ear normal.  Nose: Nose normal. No mucosal edema or rhinorrhea.    Mouth/Throat: Mucous membranes are normal. No dental abscesses or uvula swelling.  Eyes: Conjunctivae and EOM are normal.  Neck: Normal range of motion and full passive range of motion without pain. Neck supple.  Cardiovascular: Normal rate.   Pulmonary/Chest: Effort normal. No respiratory distress. He has no rhonchi. He exhibits no crepitus.  Abdominal: Soft. Normal appearance and bowel sounds are normal. He exhibits no distension. There is no tenderness. There is no rebound and no guarding.  Genitourinary:  Genitourinary Comments: Foley is in place with almost 1 L of urinary output, the urine in the bag looks yellow however within the tubing it appears to be just purulent.  Musculoskeletal: Normal range of motion. He exhibits no edema or tenderness.  Moves all extremities well.   Neurological: He is alert and oriented to person, place, and time. He has normal strength. No cranial nerve deficit.  Skin: Skin is warm, dry and intact. No rash noted. No erythema. No pallor.  Psychiatric: He has a normal mood and affect. His speech is normal and behavior is normal. His mood appears not anxious.  Nursing note and vitals reviewed.    ED Treatments / Results   Medications  ciprofloxacin (CIPRO) tablet 500 mg (not administered)    Nursing staff report they were unable to flush his catheter. It was changed. He then had almost grossly purulent drainage from his bladder. Patient was started on antibiotics and a urine culture was sent. Patient has no fever, vomiting, so at this point I do not feel like he needs to be medically admitted to the hospital. However if he should get the symptoms he should return.   Labs (all labs ordered are listed, but only abnormal results are displayed) Results for orders placed or performed during the hospital encounter of 07/12/16  Urinalysis, Routine w reflex microscopic (not at [facility])  Result Value Ref Range   Color, Urine YELLOW YELLOW   APPearance CLOUDY (A)  CLEAR   Specific Gravity, Urine 1.010 1.005 - 1.030   pH 7.5 5.0 - 8.0   Glucose, UA NEGATIVE NEGATIVE mg/dL   Hgb urine dipstick LARGE (A) NEGATIVE   Bilirubin Urine NEGATIVE NEGATIVE   Ketones, ur NEGATIVE NEGATIVE mg/dL   Protein, ur 30 (A) NEGATIVE mg/dL   Nitrite POSITIVE (A) NEGATIVE   Leukocytes, UA LARGE (A) NEGATIVE  Urine microscopic-add on  Result Value Ref Range   Squamous Epithelial / LPF NONE SEEN NONE SEEN   WBC, UA TOO NUMEROUS TO COUNT 0 - 5 WBC/hpf   RBC / HPF TOO NUMEROUS TO COUNT 0 - 5 RBC/hpf  Bacteria, UA MANY (A) NONE SEEN   Laboratory interpretation all normal except UTI, urine culture sent   Procedures   Initial Impression / Assessment and Plan / ED Course  I have reviewed the triage vital signs and the nursing notes.  Pertinent labs & imaging results that were available during my care of the patient were reviewed by me and considered in my medical decision making (see chart for details).  Final Clinical Impressions(s) / ED Diagnoses   Final diagnoses:  Urinary retention  UTI (lower urinary tract infection)    New Prescriptions New Prescriptions   CIPROFLOXACIN (CIPRO) 500 MG TABLET    Take 1 tablet (500 mg total) by mouth 2 (two) times daily.    Plan discharge  Devoria Albe, MD, Concha Pyo, MD 07/12/16 [phone]

## 2016-07-12 NOTE — Discharge Instructions (Signed)
Try to drink plenty of fluids so your urine won't get thick and stop up the catheter again. Take the antibiotics until gone. Return to the emergency department if you get a fever, nausea or vomiting, or if your wife thinks you are confused or you are getting worse in any way.

## 2016-07-12 NOTE — ED Notes (Signed)
Attempted foley irrigation but was unsuccessful. Removed old foley and placed new one. Pt had immediate relief from pain after new foley was placed.

## 2016-07-12 NOTE — ED Triage Notes (Signed)
Pt states his indwelling urinary catheter is "stopped up". States he has not had any urinary output since 2230 last night.

## 2016-07-14 LAB — URINE CULTURE: Culture: >=100000 — AB

## 2016-07-15 ENCOUNTER — Telehealth (HOSPITAL_BASED_OUTPATIENT_CLINIC_OR_DEPARTMENT_OTHER): Payer: Self-pay | Admitting: *Deleted

## 2016-07-15 NOTE — Telephone Encounter (Signed)
Positive urine culture treated with ciprofloxacin no change needed per Cassie, Stewart PharmD. 

## 2016-07-24 ENCOUNTER — Emergency Department (HOSPITAL_COMMUNITY)
Admission: EM | Admit: 2016-07-24 | Discharge: 2016-07-24 | Disposition: A | Discharge status: Home / Self Care | Payer: Medicare Other | Attending: Emergency Medicine | Admitting: Emergency Medicine

## 2016-07-24 ENCOUNTER — Encounter (HOSPITAL_COMMUNITY): Payer: Self-pay

## 2016-07-24 DIAGNOSIS — Z79899 Other long term (current) drug therapy: Secondary | ICD-10-CM | POA: Diagnosis not present

## 2016-07-24 DIAGNOSIS — R319 Hematuria, unspecified: Secondary | ICD-10-CM | POA: Diagnosis not present

## 2016-07-24 DIAGNOSIS — E119 Type 2 diabetes mellitus without complications: Secondary | ICD-10-CM | POA: Insufficient documentation

## 2016-07-24 DIAGNOSIS — I10 Essential (primary) hypertension: Secondary | ICD-10-CM | POA: Diagnosis not present

## 2016-07-24 DIAGNOSIS — Z7984 Long term (current) use of oral hypoglycemic drugs: Secondary | ICD-10-CM | POA: Diagnosis not present

## 2016-07-24 DIAGNOSIS — Z87891 Personal history of nicotine dependence: Secondary | ICD-10-CM | POA: Insufficient documentation

## 2016-07-24 LAB — URINALYSIS, ROUTINE W REFLEX MICROSCOPIC
Bilirubin Urine: NEGATIVE
Glucose, UA: NEGATIVE mg/dL
KETONES UR: NEGATIVE mg/dL
Nitrite: NEGATIVE
PH: 6 (ref 5.0–8.0)
Protein, ur: 100 mg/dL — AB
SPECIFIC GRAVITY, URINE: 1.025 (ref 1.005–1.030)

## 2016-07-24 LAB — URINE MICROSCOPIC-ADD ON: SQUAMOUS EPITHELIAL / LPF: NONE SEEN

## 2016-07-24 NOTE — ED Notes (Signed)
MD at bedside. 

## 2016-07-24 NOTE — ED Notes (Signed)
Pt made aware to return if symptoms worsen or if any life threatening symptoms occur.   

## 2016-07-24 NOTE — ED Notes (Signed)
Pt reports has had indwelling foley x 6 months and says it was changed on July 25.  Reports recently finished antibiotics for a UTI Friday and has noticed some hematuria for the past few days.  Denies pain.  Pt says his bowels are "slower than usual."  Pt says had a small bm today and was soft.  Pt says has been drinking some mineral oil.  Denies abd pain at this time.

## 2016-07-24 NOTE — ED Provider Notes (Signed)
[facility]-EM[facility] DEPT Provider Note   CSN: [id number] Arrival date & time: 07/24/16  1605  First Provider Contact:  None       History   Chief Complaint Chief Complaint  Patient presents with  . Hematuria    HPI Dustin Chandler is a 82 y.o. male.  The patient is an 83 year old male with a known history of prostatic hypertrophy which is not cancer according to a biopsy and the patient's report. He presents with a complaint of a change in the appearance of his urine which she feels looks like it is darker with more blood in it. It started yesterday, it is persistent, nothing makes it better or worse, no associated fevers. He denies any other symptoms other than feeling like he has no appetite today. There is no fevers, no vomiting, no diarrhea, in fact he states he is a bit constipated. There is no swelling of the legs, no pain in the abdomen, no pain in the penis, the catheter seems to be draining and he is emptying the bag appropriately. He has had his Foley catheter since February when it was placed at the [facility] secondary to urinary retention      Past Medical History:  Diagnosis Date  . Colon polyps 2011   Non cancerous   . Diabetes mellitus   . Diverticulosis   . Elevated PSA 2010   dr. Neoma Laming follows   . Hypertension   . Impaired glucose tolerance   . 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 Dyslipidemia 11/07/2012  . Routine general medical examination at a health care facility 07/01/2012  . DM (diabetes mellitus) (HCC) 01/01/2012  . ALLERGIC RHINITIS 02/11/2010  . PROSTATE SPECIFIC ANTIGEN, ELEVATED 09/20/2009  . Overweight (BMI 25.0-29.9) 07/18/2009  . FATIGUE 07/17/2009  . HYPERTENSION 07/18/2008    Past Surgical History:  Procedure Laterality Date  . CYSTOSTOMY W/ BLADDER BIOPSY    . removal of benign mole     15 years ago       Home Medications    Prior to Admission medications     Medication Sig Start Date End Date Taking? Authorizing Provider  apixaban (ELIQUIS) 5 MG TABS tablet Take 5 mg by mouth 2 (two) times daily.   Yes Historical Provider, MD  cholecalciferol (VITAMIN D) 1000 units tablet Take 1,000 Units by mouth daily.   Yes Historical Provider, MD  Cyanocobalamin (B-12 PO) Take 1 tablet by mouth daily.   Yes Historical Provider, MD  finasteride (PROSCAR) 5 MG tablet Take 5 mg by mouth daily.   Yes Historical Provider, MD  lisinopril (PRINIVIL,ZESTRIL) 40 MG tablet Take 40 mg by mouth daily.   Yes Historical Provider, MD  metFORMIN (GLUCOPHAGE) 500 MG tablet Take 500 mg by mouth daily.    Yes Historical Provider, MD  tamsulosin (FLOMAX) 0.4 MG CAPS capsule Take 0.4 mg by mouth daily after supper.    Yes Historical Provider, MD  thiamine (VITAMIN B-1) 100 MG tablet Take 100 mg by mouth daily.   Yes Historical Provider, MD  ciprofloxacin (CIPRO) 500 MG tablet Take 1 tablet (500 mg total) by mouth 2 (two) times daily. Patient not taking: Reported on 07/24/2016 07/12/16   Devoria Albe, MD    Family History Family History  Problem Relation Age of Onset  . Heart failure Mother   . Pneumonia Father   . Coronary artery disease Brother     bowel  obstruction  . Pneumonia Sister   . Stroke Sister   . Aneurysm Sister     Social History Social History  Substance Use Topics  . Smoking status: Former Smoker    Types: Cigarettes  . Smokeless tobacco: Never Used  . Alcohol use No     Allergies   Penicillins   Review of Systems Review of Systems  All other systems reviewed and are negative.    Physical Exam Updated Vital Signs BP 162/90 (BP Location: Left Arm)   Pulse 61   Temp 98 F (36.7 C) (Oral)   Resp 18   Ht 6\' 1"  (1.854 m)   Wt 192 lb (87.1 kg)   SpO2 100%   BMI 25.33 kg/m   Physical Exam  Constitutional: He appears well-developed and well-nourished. No distress.  HENT:  Head: Normocephalic and atraumatic.  Mouth/Throat: Oropharynx is  clear and moist. No oropharyngeal exudate.  Eyes: Conjunctivae and EOM are normal. Pupils are equal, round, and reactive to light. Right eye exhibits no discharge. Left eye exhibits no discharge. No scleral icterus.  Neck: Normal range of motion. Neck supple. No JVD present. No thyromegaly present.  Cardiovascular: Normal rate, regular rhythm, normal heart sounds and intact distal pulses.  Exam reveals no gallop and no friction rub.   No murmur heard. Pulmonary/Chest: Effort normal and breath sounds normal. No respiratory distress. He has no wheezes. He has no rales.  Abdominal: Soft. Bowel sounds are normal. He exhibits no distension and no mass. There is no tenderness.  No abdominal masses or tenderness  Genitourinary:  Genitourinary Comments: Normal appearing circumcised penis, scrotum, testicles, Foley catheter is well seated.  Musculoskeletal: Normal range of motion. He exhibits no edema or tenderness.  Lymphadenopathy:    He has no cervical adenopathy.  Neurological: He is alert. Coordination normal.  Skin: Skin is warm and dry. No rash noted. No erythema.  Psychiatric: He has a normal mood and affect. His behavior is normal.  Nursing note and vitals reviewed.    ED Treatments / Results  Labs (all labs ordered are listed, but only abnormal results are displayed) Labs Reviewed  URINALYSIS, ROUTINE W REFLEX MICROSCOPIC (NOT AT [facility]) - Abnormal; Notable for the following:       Result Value   Hgb urine dipstick LARGE (*)    Protein, ur 100 (*)    Leukocytes, UA SMALL (*)    All other components within normal limits  URINE MICROSCOPIC-ADD ON - Abnormal; Notable for the following:    Bacteria, UA RARE (*)    Crystals CA OXALATE CRYSTALS (*)    All other components within normal limits  URINE CULTURE    EKG  EKG Interpretation None       Radiology No results found.  Procedures Procedures (including critical care time)  Medications Ordered in ED Medications - No data  to display   Initial Impression / Assessment and Plan / ED Course  I have reviewed the triage vital signs and the nursing notes.  Pertinent labs & imaging results that were available during my care of the patient were reviewed by me and considered in my medical decision making (see chart for details).  Clinical Course  Comment By Time  There is no signs of systemic symptoms that would make me concerned for significant infection. The appearance of the urine in the Foley catheter appears concentrated but overall very clear. We'll check a urinalysis and a culture, the patient does not appear ill, culture ordered. Arlys John  Hyacinth MeekerMiller, MD 08/06 1658  Urinalysis shows some hematuria, no significant signs of infection, the patient will be discharged home safely. Eber HongBrian Markeith Jue, MD 08/06 Rickey [id number]     Final Clinical Impressions(s) / ED Diagnoses   Final diagnoses:  Hematuria    New Prescriptions Discharge Medication List as of 07/24/2016  6:24 PM       Eber HongBrian Ellissa Ayo, MD 07/24/16 [phone]

## 2016-07-24 NOTE — ED Triage Notes (Addendum)
Reports of hematuria 3 days ago. States he was here July 25th, had catheter placed. Reports of discomfort at site. Denies fever. Just completed round of anti-biotics for UTI per patient.

## 2016-07-24 NOTE — Discharge Instructions (Signed)
Because you are on a blood thinner, you will need to be rechecked within 1 week if you continue to see blood - there is no obvious infection in the urine.  Continue to drink plenty of fluids.

## 2016-07-27 LAB — URINE CULTURE: Culture: >=100000 — AB

## 2016-07-28 ENCOUNTER — Telehealth (HOSPITAL_BASED_OUTPATIENT_CLINIC_OR_DEPARTMENT_OTHER): Payer: Self-pay

## 2016-07-28 NOTE — Telephone Encounter (Signed)
Post ED Visit - Positive Culture Follow-up  Culture report reviewed by antimicrobial stewardship pharmacist:  []  Enzo BiNathan Batchelder, Pharm.D. []  Celedonio MiyamotoJeremy Frens, [address]Ph[address]D., BCPS []  Garvin FilaMike Maccia, Pharm.D. []  Georgina PillionElizabeth Martin, Pharm.D., BCPS []  BrentwoodMinh Pham, [address]Pharm[address]D., BCPS, AAHIVP []  Estella HuskMichelle Turner, Pharm.D., BCPS, AAHIVP []  Tennis Mustassie Stewart, Pharm.D. []  Sherle Poeob Vincent, [place]. Fred[place] HighlandMichael Bitonti Pharm D Positive urine culture and no further patient follow-up is required at this time.  Jerry CarasCullom, Tienna Bienkowski Burnett 07/28/2016, 9:50 AM

## 2018-01-25 ENCOUNTER — Ambulatory Visit: Payer: No Typology Code available for payment source | Admitting: Podiatry

## 2018-02-06 ENCOUNTER — Encounter: Payer: Self-pay | Admitting: Podiatry

## 2018-02-06 ENCOUNTER — Ambulatory Visit (INDEPENDENT_AMBULATORY_CARE_PROVIDER_SITE_OTHER): Payer: Non-veteran care | Admitting: Podiatry

## 2018-02-06 DIAGNOSIS — E1149 Type 2 diabetes mellitus with other diabetic neurological complication: Secondary | ICD-10-CM

## 2018-02-06 DIAGNOSIS — M79674 Pain in right toe(s): Secondary | ICD-10-CM | POA: Diagnosis not present

## 2018-02-06 DIAGNOSIS — M79675 Pain in left toe(s): Secondary | ICD-10-CM

## 2018-02-06 DIAGNOSIS — B351 Tinea unguium: Secondary | ICD-10-CM | POA: Diagnosis not present

## 2018-02-06 NOTE — Progress Notes (Signed)
Subjective:    Patient ID: Dustin Chandler, male    DOB: 12/12/1934, 84 y.o.   MRN: [id number]  HPI  84 year old male presents the office today for concerns of thick, painful, elongated toenails that he cannot trim himself.  He states they are painful with pressure in shoes.  He denies any redness or drainage or any swelling, from the toenail sites.  He is also requesting diabetic shoes as well as socks.  He has no other concerns today.  He states his A1c is in the low 6 range.  Review of Systems  All other systems reviewed and are negative.  Past Medical History:  Diagnosis Date  . Colon polyps 2011   Non cancerous   . Diabetes mellitus   . Diverticulosis   . Elevated PSA 2010   dr. Neoma Lamingkrishan follows   . Hypertension   . Impaired glucose tolerance   . 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 CYSTOSTOMY W/ BLADDER BIOPSY    . removal of benign mole     15 years ago     Current Outpatient Medications:  .  cloNIDine (CATAPRES) 0.1 MG tablet, Take 0.1 mg by mouth 2 (two) times daily., Disp: , Rfl:  .  losartan (COZAAR) 100 MG tablet, Take 100 mg by mouth daily., Disp: , Rfl:  .  apixaban (ELIQUIS) 5 MG TABS tablet, Take 5 mg by mouth 2 (two) times daily., Disp: , Rfl:  .  cholecalciferol (VITAMIN D) 1000 units tablet, Take 1,000 Units by mouth daily., Disp: , Rfl:  .  ciprofloxacin (CIPRO) 500 MG tablet, Take 1 tablet (500 mg total) by mouth 2 (two) times daily. (Patient not taking: Reported on 07/24/2016), Disp: 20 tablet, Rfl: 0 .  Cyanocobalamin (B-12 PO), Take 1 tablet by mouth daily., Disp: , Rfl:  .  finasteride (PROSCAR) 5 MG tablet, Take 5 mg by mouth daily., Disp: , Rfl:  .  lisinopril (PRINIVIL,ZESTRIL) 40 MG tablet, Take 40 mg by mouth daily., Disp: , Rfl:  .  metFORMIN (GLUCOPHAGE) 500 MG tablet, Take 500 mg by mouth daily. , Disp: , Rfl:  .  tamsulosin (FLOMAX) 0.4 MG CAPS capsule, Take 0.4 mg by mouth daily after  supper. , Disp: , Rfl:  .  thiamine (VITAMIN B-1) 100 MG tablet, Take 100 mg by mouth daily., Disp: , Rfl:   Allergies  Allergen Reactions  . Amlodipine   . Penicillins     Has patient had a PCN reaction causing immediate rash, facial/tongue/throat swelling, SOB or lightheadedness with hypotension: No Has patient had a PCN reaction causing severe rash involving mucus membranes or skin necrosis: No Has patient had a PCN reaction that required hospitalization No Has patient had a PCN reaction occurring within the last 10 years: No  "Locked my knee up" If all of the above answers are "NO", then may proceed with Cephalosporin use.     Social History   Socioeconomic History  . Marital status: Married    Spouse name: Not on file  . Number of children: 4  . Years of education: Not on file  . Highest education level: Not on file  Social Needs  . Financial resource strain: Not on file  . Food insecurity - worry: Not on file  . Food insecurity - inability: Not on file  . Transportation needs - medical: Not on file  . Transportation needs - non-medical: Not on file  Occupational History  . Occupation: Employed   Tobacco Use  . Smoking status: Former Smoker    Types: Cigarettes  . Smokeless tobacco: Never Used  Substance and Sexual Activity  . Alcohol use: No  . Drug use: No  . Sexual activity: Not on file  Other Topics Concern  . Not on file  Social History Narrative  . Not on file        Objective:   Physical Exam General: AAO x3, NAD  Dermatological: Nails are hypertrophic, dystrophic, brittle, discolored, elongated 10. No surrounding redness or drainage. Tenderness nails 1-5 bilaterally. No open lesions or pre-ulcerative lesions are identified today.  Vascular: Dorsalis Pedis artery and Posterior Tibial artery pedal pulses are 2/4 bilateral with immedate capillary fill time. There is no pain with calf compression, swelling, warmth, erythema.   Neruologic: Mild  decrease in sensation with Dorann Ou monofilament to bilateral feet.  Musculoskeletal: Hammertoes present.  Muscular strength 5/5 in all groups tested bilateral.  Gait: Unassisted, Nonantalgic.       Assessment & Plan:  84 year old male with symptomatic onychomycosis -Treatment options discussed including all alternatives, risks, and complications -Etiology of symptoms were discussed -Nails debrided 10 without complications or bleeding. -I do recommend diabetic shoes and inserts as well as socks.  Will refer back to the [place] for this.  We will send the notes for this to his PCP. -Daily foot inspection -Follow-up in 3 months or sooner if any problems arise. In the meantime, encouraged to call the office with any questions, concerns, change in symptoms.   Ovid Curd, DPM

## 2018-02-15 ENCOUNTER — Telehealth: Payer: Self-pay | Admitting: Podiatry

## 2018-02-15 NOTE — Telephone Encounter (Signed)
Left message for pt that the Veteran's administration gave [place] an authorization but it expires 3.21.19 so for his follow up appt in May we will need a new authorization from Veteran's administration.

## 2018-02-16 ENCOUNTER — Telehealth: Payer: Self-pay | Admitting: *Deleted

## 2018-02-16 NOTE — Telephone Encounter (Signed)
Pt states he received a call from our office.

## 2018-02-23 NOTE — Telephone Encounter (Signed)
Left message that for his follow up appt in may we will need a new authorization from the veteran's admin.

## 2018-03-30 ENCOUNTER — Telehealth: Payer: Self-pay | Admitting: Podiatry

## 2018-03-30 NOTE — Telephone Encounter (Signed)
This is Dustin Chandler with [facility]. [name] was seen on 19 February and I was calling to see if I could get those notes faxed to my attention at [phone]. Thank you.

## 2018-05-10 ENCOUNTER — Ambulatory Visit: Payer: Non-veteran care | Admitting: Podiatry

## 2019-02-25 ENCOUNTER — Emergency Department (HOSPITAL_COMMUNITY)
Admission: EM | Admit: 2019-02-25 | Discharge: 2019-02-25 | Disposition: A | Discharge status: Home / Self Care | Payer: Medicare Other | Attending: Emergency Medicine | Admitting: Emergency Medicine

## 2019-02-25 ENCOUNTER — Other Ambulatory Visit: Payer: Self-pay

## 2019-02-25 ENCOUNTER — Encounter (HOSPITAL_COMMUNITY): Payer: Self-pay | Admitting: Emergency Medicine

## 2019-02-25 DIAGNOSIS — Z7901 Long term (current) use of anticoagulants: Secondary | ICD-10-CM | POA: Diagnosis not present

## 2019-02-25 DIAGNOSIS — Z87891 Personal history of nicotine dependence: Secondary | ICD-10-CM | POA: Diagnosis not present

## 2019-02-25 DIAGNOSIS — Z79899 Other long term (current) drug therapy: Secondary | ICD-10-CM | POA: Insufficient documentation

## 2019-02-25 DIAGNOSIS — E119 Type 2 diabetes mellitus without complications: Secondary | ICD-10-CM | POA: Diagnosis not present

## 2019-02-25 DIAGNOSIS — I10 Essential (primary) hypertension: Secondary | ICD-10-CM | POA: Diagnosis not present

## 2019-02-25 DIAGNOSIS — Z7984 Long term (current) use of oral hypoglycemic drugs: Secondary | ICD-10-CM | POA: Diagnosis not present

## 2019-02-25 HISTORY — DX: Post-traumatic stress disorder, unspecified: F43.10

## 2019-02-25 NOTE — ED Provider Notes (Signed)
[facility] EMERGENCY DEPARTMENT Provider Note   CSN: [id number] Arrival date & time: 02/25/19  1549    History   Chief Complaint Chief Complaint  Patient presents with  . Hypertension    HPI Dustin Chandler is a 85 y.o. male.     HPI   Dustin Chandler is a 85 y.o. male who presents to the Emergency Department requesting evaluation for his hypertension.  He states he was seen earlier today at the [place] [facility] in [place] for a routine office visit.  He was noted to be hypertensive at 190/102 and told to come to the emergency department for further evaluation.  He takes clonidine and lisinopril daily.  He states he has had his clonidine dose, but takes his lisinopril in the evening.  He also reports a history of PTSD and states that he gets anxious and nervous at the doctor's office.  He denies chest pain, shortness of breath, headache or dizziness, no peripheral edema.  No recent changes to his medication regimen.  He denies any symptoms at present.  Past Medical History:  Diagnosis Date  . Colon polyps 2011   Non cancerous   . Diabetes mellitus   . Diverticulosis   . Elevated PSA 2010   dr. Neoma Laming follows   . Hypertension   . Impaired glucose tolerance   . PTSD (post-traumatic stress disorder)   . 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 Dyslipidemia 11/07/2012  . Routine general medical examination at a health care facility 07/01/2012  . DM (diabetes mellitus) (HCC) 01/01/2012  . ALLERGIC RHINITIS 02/11/2010  . PROSTATE SPECIFIC ANTIGEN, ELEVATED 09/20/2009  . Overweight (BMI 25.0-29.9) 07/18/2009  . FATIGUE 07/17/2009  . HYPERTENSION 07/18/2008    Past Surgical History:  Procedure Laterality Date  . CYSTOSTOMY W/ BLADDER BIOPSY    . removal of benign mole     15 years ago        Home Medications    Prior to Admission medications   Medication Sig Start Date End Date Taking? Authorizing  Provider  apixaban (ELIQUIS) 5 MG TABS tablet Take 5 mg by mouth 2 (two) times daily.    [provider]  cholecalciferol (VITAMIN D) 1000 units tablet Take 1,000 Units by mouth daily.    [provider]  ciprofloxacin (CIPRO) 500 MG tablet Take 1 tablet (500 mg total) by mouth 2 (two) times daily. Patient not taking: Reported on 07/24/2016 07/12/16   Devoria Albe, MD  cloNIDine (CATAPRES) 0.1 MG tablet Take 0.1 mg by mouth 2 (two) times daily.    [provider]  Cyanocobalamin (B-12 PO) Take 1 tablet by mouth daily.    [provider]  finasteride (PROSCAR) 5 MG tablet Take 5 mg by mouth daily.    [provider]  lisinopril (PRINIVIL,ZESTRIL) 40 MG tablet Take 40 mg by mouth daily.    [provider]  losartan (COZAAR) 100 MG tablet Take 100 mg by mouth daily.    [provider]  metFORMIN (GLUCOPHAGE) 500 MG tablet Take 500 mg by mouth daily.     [provider]  tamsulosin (FLOMAX) 0.4 MG CAPS capsule Take 0.4 mg by mouth daily after supper.     [provider]  thiamine (VITAMIN B-1) 100 MG tablet Take 100 mg by mouth daily.    [provider]    Family History Family History  Problem Relation Age  of Onset  . Heart failure Mother   . Pneumonia Father   . Coronary artery disease Brother        bowel obstruction  . Pneumonia Sister   . Stroke Sister   . Aneurysm Sister     Social History Social History   Tobacco Use  . Smoking status: Former Smoker    Types: Cigarettes  . Smokeless tobacco: Never Used  Substance Use Topics  . Alcohol use: No  . Drug use: No     Allergies   Amlodipine and Penicillins   Review of Systems Review of Systems  Constitutional: Negative for chills, fatigue and fever.  Eyes: Negative for visual disturbance.  Respiratory: Negative for cough and shortness of breath.   Cardiovascular: Negative for chest pain and leg swelling.  Gastrointestinal: Negative for  abdominal pain, nausea and vomiting.  Genitourinary: Negative for decreased urine volume, dysuria and hematuria.  Musculoskeletal: Negative for arthralgias, back pain, myalgias, neck pain and neck stiffness.  Skin: Negative for rash.  Neurological: Negative for dizziness, syncope, speech difficulty, weakness, numbness and headaches.  Psychiatric/Behavioral: Negative for confusion.     Physical Exam Updated Vital Signs BP (!) 158/92 (BP Location: Right Arm)   Pulse (!) 59   Temp 98.5 F (36.9 C) (Oral)   Resp 18   Ht  (1.854 m)   Wt 92.1 kg   SpO2 98%   BMI 26.78 kg/m   Physical Exam Vitals signs and nursing note reviewed.  Constitutional:      General: He is not in acute distress.    Appearance: Normal appearance. He is well-developed. He is not ill-appearing.  HENT:     Head: Atraumatic.  Eyes:     Extraocular Movements: Extraocular movements intact.     Conjunctiva/sclera: Conjunctivae normal.     Pupils: Pupils are equal, round, and reactive to light.  Neck:     Musculoskeletal: Normal range of motion and neck supple.  Cardiovascular:     Rate and Rhythm: Normal rate and regular rhythm.     Pulses: Normal pulses.     Heart sounds: Normal heart sounds.  Pulmonary:     Effort: Pulmonary effort is normal. No respiratory distress.     Breath sounds: Normal breath sounds.  Chest:     Chest wall: No tenderness.  Musculoskeletal: Normal range of motion.        General: No tenderness.     Right lower leg: No edema.     Left lower leg: No edema.  Skin:    General: Skin is warm.     Capillary Refill: Capillary refill takes less than 2 seconds.     Findings: No rash.  Neurological:     General: No focal deficit present.     Mental Status: He is alert. Mental status is at baseline.     GCS: GCS eye subscore is 4. GCS verbal subscore is 5. GCS motor subscore is 6.     Sensory: Sensation is intact. No sensory deficit.     Motor: Motor function is intact. No weakness  or abnormal muscle tone.     Coordination: Coordination is intact.     Gait: Gait is intact. Gait normal.     Comments: CN II-XII grossly intact.  Speech clear.        ED Treatments / Results  Labs (all labs ordered are listed, but only abnormal results are displayed) Labs Reviewed - No data to display  EKG None  Radiology No  results found.  Procedures Procedures (including critical care time)  Medications Ordered in ED Medications - No data to display   Initial Impression / Assessment and Plan / ED Course  I have reviewed the triage vital signs and the nursing notes.  Pertinent labs & imaging results that were available during my care of the patient were reviewed by me and considered in my medical decision making (see chart for details).        Patient here for evaluation of hypertension, has known history.  States sent by [facility].  He denies any symptoms at present.  He has not taken his evening dose of lisinopril.  BP here mildly elevated, but no concerning symptoms for hypertensive crisis.  I feel that he is appropriate for discharge home.  He agrees to take his medication when he arrives home.  I discussed findings and care plan with Dr. Estell Harpin.  Patient given return precautions and he agrees to plan.  Final Clinical Impressions(s) / ED Diagnoses   Final diagnoses:  Essential hypertension    ED Discharge Orders    None       Pauline Aus, PA-C 02/27/19 1443    Bethann Berkshire, MD 03/02/19 1313

## 2019-02-25 NOTE — Discharge Instructions (Addendum)
Be sure to take your lisinopril this evening when you get home.  Continue taking your daily medications as directed.  Return to the emergency department for any worsening symptoms such as sudden headache, chest pain, shortness of breath or dizziness.

## 2019-02-25 NOTE — ED Triage Notes (Signed)
Sent from Dustin Chandler in [place] for bp 190/102 . Pt reports he took his bp meds this morning.  Denies any s/s.  States he was a little anxious at his appointment.

## 2019-05-31 NOTE — Progress Notes (Signed)
Medication prescribed

## 2019-12-07 ENCOUNTER — Emergency Department (HOSPITAL_COMMUNITY)
Admission: EM | Admit: 2019-12-07 | Discharge: 2019-12-07 | Disposition: A | Discharge status: Home / Self Care | Payer: No Typology Code available for payment source | Attending: Emergency Medicine | Admitting: Emergency Medicine

## 2019-12-07 ENCOUNTER — Other Ambulatory Visit: Payer: Self-pay

## 2019-12-07 ENCOUNTER — Encounter (HOSPITAL_COMMUNITY): Payer: Self-pay | Admitting: *Deleted

## 2019-12-07 DIAGNOSIS — I10 Essential (primary) hypertension: Secondary | ICD-10-CM | POA: Diagnosis not present

## 2019-12-07 DIAGNOSIS — T7840XA Allergy, unspecified, initial encounter: Secondary | ICD-10-CM | POA: Diagnosis not present

## 2019-12-07 DIAGNOSIS — Z87891 Personal history of nicotine dependence: Secondary | ICD-10-CM | POA: Diagnosis not present

## 2019-12-07 DIAGNOSIS — E119 Type 2 diabetes mellitus without complications: Secondary | ICD-10-CM | POA: Diagnosis not present

## 2019-12-07 DIAGNOSIS — R600 Localized edema: Secondary | ICD-10-CM | POA: Diagnosis present

## 2019-12-07 DIAGNOSIS — Z79899 Other long term (current) drug therapy: Secondary | ICD-10-CM | POA: Insufficient documentation

## 2019-12-07 DIAGNOSIS — Z7984 Long term (current) use of oral hypoglycemic drugs: Secondary | ICD-10-CM | POA: Insufficient documentation

## 2019-12-07 LAB — CBC WITH DIFFERENTIAL/PLATELET
Abs Immature Granulocytes: 0.05 10*3/uL (ref 0.00–0.07)
Basophils Absolute: 0 10*3/uL (ref 0.0–0.1)
Basophils Relative: 0 %
Eosinophils Absolute: 0.1 10*3/uL (ref 0.0–0.5)
Eosinophils Relative: 2 %
HCT: 45.3 % (ref 39.0–52.0)
Hemoglobin: 14.2 g/dL (ref 13.0–17.0)
Immature Granulocytes: 1 %
Lymphocytes Relative: 29 %
Lymphs Abs: 1.4 10*3/uL (ref 0.7–4.0)
MCH: 28.6 pg (ref 26.0–34.0)
MCHC: 31.3 g/dL (ref 30.0–36.0)
MCV: 91.1 fL (ref 80.0–100.0)
Monocytes Absolute: 0.5 10*3/uL (ref 0.1–1.0)
Monocytes Relative: 11 %
Neutro Abs: 2.7 10*3/uL (ref 1.7–7.7)
Neutrophils Relative %: 57 %
Platelets: 218 10*3/uL (ref 150–400)
RBC: 4.97 MIL/uL (ref 4.22–5.81)
RDW: 12.7 % (ref 11.5–15.5)
WBC: 4.6 10*3/uL (ref 4.0–10.5)
nRBC: 0 % (ref 0.0–0.2)

## 2019-12-07 LAB — BASIC METABOLIC PANEL
Anion gap: 5 (ref 5–15)
BUN: 18 mg/dL (ref 8–23)
CO2: 27 mmol/L (ref 22–32)
Calcium: 10.4 mg/dL — ABNORMAL HIGH (ref 8.9–10.3)
Chloride: 108 mmol/L (ref 98–111)
Creatinine, Ser: 0.9 mg/dL (ref 0.61–1.24)
GFR calc Af Amer: >60 mL/min (ref >60)
GFR calc non Af Amer: >60 mL/min (ref >60)
Glucose, Bld: 110 mg/dL — ABNORMAL HIGH (ref 70–99)
Potassium: 4.1 mmol/L (ref 3.5–5.1)
Sodium: 140 mmol/L (ref 135–145)

## 2019-12-07 MED ORDER — METHYLPREDNISOLONE SODIUM SUCC 125 MG IJ SOLR
125.0000 mg | Freq: Once | INTRAMUSCULAR | Status: AC
  Administered 2019-12-07: 125 mg via INTRAVENOUS
  Filled 2019-12-07: qty 2

## 2019-12-07 MED ORDER — DIPHENHYDRAMINE HCL 50 MG/ML IJ SOLN
25.0000 mg | Freq: Once | INTRAMUSCULAR | Status: AC
  Administered 2019-12-07: 14:00:00 25 mg via INTRAVENOUS
  Filled 2019-12-07: qty 1

## 2019-12-07 NOTE — ED Notes (Signed)
Very slight decrease in lip swelling. Pt continues to deny difficulty breathing. Pt resting comfortably.

## 2019-12-07 NOTE — ED Provider Notes (Signed)
[facility] EMERGENCY DEPARTMENT Provider Note   CSN: [id number] Arrival date & time: 12/07/19  1307     History Chief Complaint  Patient presents with  . Oral Swelling    Dustin Chandler is a 85 y.o. male.  Patient states that he started with some swelling to his lips today.  Patient does take lisinopril and this is not happened before  The history is provided by the patient. No language interpreter was used.  Allergic Reaction Presenting symptoms: no difficulty breathing and no rash   Severity:  Moderate Prior allergic episodes:  No prior episodes Context: not animal exposure   Relieved by:  Nothing Worsened by:  Nothing Ineffective treatments:  None tried      Past Medical History:  Diagnosis Date  . Colon polyps 2011   Non cancerous   . Diabetes mellitus   . Diverticulosis   . Elevated PSA 2010   dr. Neoma Laming follows   . Hypertension   . Impaired glucose tolerance   . PTSD (post-traumatic stress disorder)   . 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 Dyslipidemia 11/07/2012  . Routine general medical examination at a health care facility 07/01/2012  . DM (diabetes mellitus) (HCC) 01/01/2012  . ALLERGIC RHINITIS 02/11/2010  . PROSTATE SPECIFIC ANTIGEN, ELEVATED 09/20/2009  . Overweight (BMI 25.0-29.9) 07/18/2009  . FATIGUE 07/17/2009  . HYPERTENSION 07/18/2008    Past Surgical History:  Procedure Laterality Date  . CYSTOSTOMY W/ BLADDER BIOPSY    . removal of benign mole     15 years ago       Family History  Problem Relation Age of Onset  . Heart failure Mother   . Pneumonia Father   . Coronary artery disease Brother        bowel obstruction  . Pneumonia Sister   . Stroke Sister   . Aneurysm Sister     Social History   Tobacco Use  . Smoking status: Former Smoker    Types: Cigarettes  . Smokeless tobacco: Never Used  Substance Use Topics  . Alcohol use: No  . Drug use: No     Home Medications Prior to Admission medications   Medication Sig Start Date End Date Taking? Authorizing Provider  apixaban (ELIQUIS) 5 MG TABS tablet Take 5 mg by mouth 2 (two) times daily.    [provider]  cholecalciferol (VITAMIN D) 1000 units tablet Take 1,000 Units by mouth daily.    [provider]  ciprofloxacin (CIPRO) 500 MG tablet Take 1 tablet (500 mg total) by mouth 2 (two) times daily. Patient not taking: Reported on 07/24/2016 07/12/16   Devoria Albe, MD  cloNIDine (CATAPRES) 0.1 MG tablet Take 0.1 mg by mouth 2 (two) times daily.    [provider]  Cyanocobalamin (B-12 PO) Take 1 tablet by mouth daily.    [provider]  finasteride (PROSCAR) 5 MG tablet Take 5 mg by mouth daily.    [provider]  lisinopril (PRINIVIL,ZESTRIL) 40 MG tablet Take 40 mg by mouth daily.    [provider]  losartan (COZAAR) 100 MG tablet Take 100 mg by mouth daily.    [provider]  metFORMIN (GLUCOPHAGE) 500 MG tablet Take 500 mg by mouth daily.     [provider]  tamsulosin (FLOMAX) 0.4 MG CAPS capsule Take 0.4 mg by mouth daily after supper.     [provider]  thiamine (VITAMIN B-1) 100 MG tablet Take 100 mg by mouth daily.    [provider]    Allergies    Amlodipine and Penicillins  Review of Systems   Review of Systems  Constitutional: Negative for appetite change and fatigue.  HENT: Negative for congestion, ear discharge and sinus pressure.        Facial swelling  Eyes: Negative for discharge.  Respiratory: Negative for cough.   Cardiovascular: Negative for chest pain.  Gastrointestinal: Negative for abdominal pain and diarrhea.  Genitourinary: Negative for frequency and hematuria.  Musculoskeletal: Negative for back pain.  Skin: Negative for rash.  Neurological: Negative for seizures and headaches.  Psychiatric/Behavioral: Negative for hallucinations.    Physical  Exam Updated Vital Signs BP 140/80   Pulse 68   Temp 98.2 F (36.8 C) (Oral)   Resp (!) 22   Ht 6\' 1"  (1.854 m)   Wt 93 kg   SpO2 94%   BMI 27.05 kg/m   Physical Exam Vitals and nursing note reviewed.  Constitutional:      Appearance: He is well-developed.  HENT:     Head: Normocephalic.     Comments: Patient has swelling to both lips worse on the right side oropharynx normal    Nose: Nose normal.  Eyes:     General: No scleral icterus.    Conjunctiva/sclera: Conjunctivae normal.  Neck:     Thyroid: No thyromegaly.  Cardiovascular:     Rate and Rhythm: Normal rate and regular rhythm.     Heart sounds: No murmur. No friction rub. No gallop.   Pulmonary:     Breath sounds: No stridor. No wheezing or rales.  Chest:     Chest wall: No tenderness.  Abdominal:     General: There is no distension.     Tenderness: There is no abdominal tenderness. There is no rebound.  Musculoskeletal:        General: Normal range of motion.     Cervical back: Neck supple.  Lymphadenopathy:     Cervical: No cervical adenopathy.  Skin:    Findings: No erythema or rash.  Neurological:     Mental Status: He is oriented to person, place, and time.     Motor: No abnormal muscle tone.     Coordination: Coordination normal.  Psychiatric:        Behavior: Behavior normal.     ED Results / Procedures / Treatments   Labs (all labs ordered are listed, but only abnormal results are displayed) Labs Reviewed  BASIC METABOLIC PANEL - Abnormal; Notable for the following components:      Result Value   Glucose, Bld 110 (*)    Calcium 10.4 (*)    All other components within normal limits  CBC WITH DIFFERENTIAL/PLATELET    EKG None  Radiology No results found.  Procedures Procedures (including critical care time)  Medications Ordered in ED Medications  diphenhydrAMINE (BENADRYL) injection 25 mg (25 mg Intravenous Given 12/07/19 1332)  methylPREDNISolone sodium succinate (SOLU-MEDROL)  125 mg/2 mL injection 125 mg (125 mg Intravenous Given 12/07/19 1332)    ED Course  I have reviewed the triage vital signs and the nursing notes.  Pertinent labs & imaging results that were available during my care of the patient were reviewed by me and considered in my medical decision making (see chart for details). CRITICAL CARE Performed by: Milton Ferguson Total critical care time: 40 minutes Critical care time was exclusive of separately billable procedures and treating  other patients. Critical care was necessary to treat or prevent imminent or life-threatening deterioration. Critical care was time spent personally by me on the following activities: development of treatment plan with patient and/or surrogate as well as nursing, discussions with consultants, evaluation of patient's response to treatment, examination of patient, obtaining history from patient or surrogate, ordering and performing treatments and interventions, ordering and review of laboratory studies, ordering and review of radiographic studies, pulse oximetry and re-evaluation of patient's condition.    MDM Rules/Calculators/A&P                      Patient with angioedema from lisinopril Final Clinical Impression(s) / ED Diagnoses Final diagnoses:  None    Rx / DC Orders ED Discharge Orders    None       Bethann BerkshireZammit, Averlee Swartz, MD 12/07/19 1448

## 2019-12-07 NOTE — Discharge Instructions (Addendum)
Stop taking your lisinopril.   Follow-up with your doctor this week to find out what kind of blood pressure medicine take.  Return if problems

## 2019-12-07 NOTE — ED Notes (Signed)
Decrease in pt's lip swelling. Pt resting comfortably. O2 sats WNL.

## 2019-12-07 NOTE — ED Triage Notes (Addendum)
Pt c/o right sided facial numbness that started when he woke up at 0800 and then his lips started swelling around 0900. Pt denies any other numbess to right or left side. Pt denies difficulty breathing. Pt takes Lisinopril, last taken this morning. Pt has obvious swelling to lips at time of triage. Dr. Roderic Palau at bedside.

## 2019-12-27 ENCOUNTER — Other Ambulatory Visit: Payer: Self-pay

## 2019-12-27 ENCOUNTER — Ambulatory Visit: Payer: Medicare Other | Attending: Internal Medicine

## 2019-12-27 DIAGNOSIS — Z20822 Contact with and (suspected) exposure to covid-19: Secondary | ICD-10-CM

## 2019-12-28 LAB — NOVEL CORONAVIRUS, NAA: SARS-CoV-2, NAA: DETECTED — AB

## 2019-12-30 ENCOUNTER — Telehealth: Payer: Self-pay | Admitting: Unknown Physician Specialty

## 2019-12-30 NOTE — Telephone Encounter (Signed)
Called to discuss with patient about Covid symptoms and the use of bamlanivimab, a monoclonal antibody infusion for those with mild to moderate Covid symptoms and at a high risk of hospitalization.  Pt is qualified for this infusion at the [facility] due to Age > 65   Message left to call back  

## 2020-01-03 ENCOUNTER — Emergency Department (HOSPITAL_COMMUNITY)
Admission: EM | Admit: 2020-01-03 | Discharge: 2020-01-04 | Disposition: A | Discharge status: Home / Self Care | Payer: No Typology Code available for payment source | Attending: Emergency Medicine | Admitting: Emergency Medicine

## 2020-01-03 ENCOUNTER — Other Ambulatory Visit: Payer: Self-pay

## 2020-01-03 ENCOUNTER — Encounter (HOSPITAL_COMMUNITY): Payer: Self-pay | Admitting: Emergency Medicine

## 2020-01-03 DIAGNOSIS — Z79899 Other long term (current) drug therapy: Secondary | ICD-10-CM | POA: Diagnosis not present

## 2020-01-03 DIAGNOSIS — Z87891 Personal history of nicotine dependence: Secondary | ICD-10-CM | POA: Diagnosis not present

## 2020-01-03 DIAGNOSIS — E119 Type 2 diabetes mellitus without complications: Secondary | ICD-10-CM | POA: Insufficient documentation

## 2020-01-03 DIAGNOSIS — Z7984 Long term (current) use of oral hypoglycemic drugs: Secondary | ICD-10-CM | POA: Insufficient documentation

## 2020-01-03 DIAGNOSIS — I10 Essential (primary) hypertension: Secondary | ICD-10-CM | POA: Insufficient documentation

## 2020-01-03 DIAGNOSIS — R42 Dizziness and giddiness: Secondary | ICD-10-CM | POA: Insufficient documentation

## 2020-01-03 LAB — CBC
HCT: 43.5 % (ref 39.0–52.0)
Hemoglobin: 13.6 g/dL (ref 13.0–17.0)
MCH: 28.4 pg (ref 26.0–34.0)
MCHC: 31.3 g/dL (ref 30.0–36.0)
MCV: 90.8 fL (ref 80.0–100.0)
Platelets: 175 10*3/uL (ref 150–400)
RBC: 4.79 MIL/uL (ref 4.22–5.81)
RDW: 12.8 % (ref 11.5–15.5)
WBC: 3.7 10*3/uL — ABNORMAL LOW (ref 4.0–10.5)
nRBC: 0 % (ref 0.0–0.2)

## 2020-01-03 LAB — BASIC METABOLIC PANEL
Anion gap: 8 (ref 5–15)
BUN: 18 mg/dL (ref 8–23)
CO2: 28 mmol/L (ref 22–32)
Calcium: 9.9 mg/dL (ref 8.9–10.3)
Chloride: 107 mmol/L (ref 98–111)
Creatinine, Ser: 0.84 mg/dL (ref 0.61–1.24)
GFR calc Af Amer: >60 mL/min (ref >60)
GFR calc non Af Amer: >60 mL/min (ref >60)
Glucose, Bld: 100 mg/dL — ABNORMAL HIGH (ref 70–99)
Potassium: 4.1 mmol/L (ref 3.5–5.1)
Sodium: 143 mmol/L (ref 135–145)

## 2020-01-03 LAB — CBG MONITORING, ED: Glucose-Capillary: 85 mg/dL (ref 70–99)

## 2020-01-03 NOTE — ED Triage Notes (Signed)
Pt reports one episode of dizziness and blurred vision earlier today. Pt unsure of what time symptoms started and reports lasted approximately 1 hr. Pt denies any dizziness, blurred vision at this time. Pt reports diagnosed with covid a few days ago. Pt alert, ambulated with steady gait. No extremity drift, facial abnormality noted. Speech clear.

## 2020-01-04 ENCOUNTER — Emergency Department (HOSPITAL_COMMUNITY): Payer: No Typology Code available for payment source

## 2020-01-04 LAB — URINALYSIS, ROUTINE W REFLEX MICROSCOPIC
Bacteria, UA: NONE SEEN
Bilirubin Urine: NEGATIVE
Glucose, UA: NEGATIVE mg/dL
Hgb urine dipstick: NEGATIVE
Ketones, ur: 5 mg/dL — AB
Leukocytes,Ua: NEGATIVE
Nitrite: NEGATIVE
Protein, ur: 30 mg/dL — AB
Specific Gravity, Urine: 1.019 (ref 1.005–1.030)
pH: 6 (ref 5.0–8.0)

## 2020-01-04 MED ORDER — ONDANSETRON HCL 4 MG PO TABS: 4.0000 mg | ORAL_TABLET | Freq: Three times a day (TID) | ORAL | 0 refills | Status: DC | PRN

## 2020-01-04 MED ORDER — MECLIZINE HCL 25 MG PO TABS: 25.0000 mg | ORAL_TABLET | Freq: Three times a day (TID) | ORAL | 0 refills | Status: DC | PRN

## 2020-01-04 MED ORDER — IOHEXOL 350 MG/ML SOLN
40.0000 mL | Freq: Once | INTRAVENOUS | Status: AC | PRN
  Administered 2020-01-04: 40 mL via INTRAVENOUS

## 2020-01-04 NOTE — Discharge Instructions (Addendum)
Your test today showed you did not have a stroke as the cause of your dizziness and your blurred vision.  You had vertigo, unfortunately it can happen again.  No driving or doing any activity such as climbing ladders that if you had another episode you can get dizzy and fall.  Take the medications for dizziness and nausea.  Return to the emergency department if you have uncontrollable vomiting.

## 2020-01-04 NOTE — ED Notes (Signed)
Pt given ice water per request and DR Lynelle Doctor ok

## 2020-01-04 NOTE — ED Notes (Signed)
Pt transported to CT ?

## 2020-01-04 NOTE — ED Notes (Signed)
Patient EKG done and seen by Dr Devoria Albe.

## 2020-01-04 NOTE — ED Provider Notes (Signed)
[facility] EMERGENCY DEPARTMENT Provider Note   CSN: [id number] Arrival date & time: 01/03/20  1617   Time seen 2:00 AM History Chief Complaint  Patient presents with  . Dizziness    Dustin Chandler is a 86 y.o. male.  HPI   Patient states around noon he was asleep and the phone rang.  When he got up to answer it he said the room felt like it was spinning around him and he had blurred vision.  He states he felt like he was falling but he did not fall.  He states standing up made the symptoms worse.  He told me it lasted 30 minutes.  He denies headache, numbness or new tingling (he has neuropathy), nausea, vomiting.  He states it was hard to walk because he was off balance.  He states he is never had this before, he has had no more episodes since that first 1 at noon.  He was tested positive for Covid on January 8 when he and his daughter both had a cough and they both were positive.  His wife has remained negative.  He denies fever, sore throat although he states it is dry, rhinorrhea, or shortness of breath.  PCP [place], [place]  Past Medical History:  Diagnosis Date  . Colon polyps 2011   Non cancerous   . Diabetes mellitus   . Diverticulosis   . Elevated PSA 2010   dr. Vernell Barrier follows   . Hypertension   . Impaired glucose tolerance   . PTSD (post-traumatic stress disorder)   . 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 Dyslipidemia 11/07/2012  . Routine general medical examination at a health care facility 07/01/2012  . DM (diabetes mellitus) ([place]) 01/01/2012  . ALLERGIC RHINITIS 02/11/2010  . PROSTATE SPECIFIC ANTIGEN, ELEVATED 09/20/2009  . Overweight (BMI 25.0-29.9) 07/18/2009  . FATIGUE 07/17/2009  . HYPERTENSION 07/18/2008    Past Surgical History:  Procedure Laterality Date  . CYSTOSTOMY W/ BLADDER BIOPSY    . removal of benign mole     15 years ago       Family History  Problem Relation Age of Onset   . Heart failure Mother   . Pneumonia Father   . Coronary artery disease Brother        bowel obstruction  . Pneumonia Sister   . Stroke Sister   . Aneurysm Sister     Social History   Tobacco Use  . Smoking status: Former Smoker    Types: Cigarettes  . Smokeless tobacco: Never Used  Substance Use Topics  . Alcohol use: No  . Drug use: No  lives at home Lives with spouse and daughter  Home Medications Prior to Admission medications   Medication Sig Start Date End Date Taking? Authorizing Provider  cholecalciferol (VITAMIN D) 1000 units tablet Take 1,000 Units by mouth daily.   Yes [provider]  cloNIDine (CATAPRES) 0.1 MG tablet Take 0.1 mg by mouth 2 (two) times daily.   Yes [provider]  losartan (COZAAR) 100 MG tablet Take 100 mg by mouth daily.   Yes [provider]  metFORMIN (GLUCOPHAGE-XR) 500 MG 24 hr tablet Take 500 mg by mouth daily. 10/08/19  Yes [provider]  thiamine (VITAMIN B-1) 100 MG tablet Take 100 mg by mouth daily.   Yes [provider]  lisinopril (PRINIVIL,ZESTRIL) 40 MG tablet Take 40 mg by mouth daily.  [provider]  meclizine (ANTIVERT) 25 MG tablet Take 1 tablet (25 mg total) by mouth 3 (three) times daily as needed for dizziness. 01/04/20   Devoria Albe, MD  ondansetron (ZOFRAN) 4 MG tablet Take 1 tablet (4 mg total) by mouth every 8 (eight) hours as needed for nausea or vomiting. 01/04/20   Devoria Albe, MD    Allergies    Lisinopril, Amlodipine, and Penicillins  Review of Systems   Review of Systems  All other systems reviewed and are negative.   Physical Exam Updated Vital Signs BP (!) 165/93   Pulse 63   Temp 98.5 F (36.9 C) (Oral)   Resp 17   Ht 6\' 1"  (1.854 m)   Wt 93 kg   SpO2 99%   BMI 27.05 kg/m   Physical Exam Vitals and nursing note reviewed.  Constitutional:      General: He is not in acute distress.    Appearance: Normal appearance. He is well-developed.  He is not ill-appearing or toxic-appearing.  HENT:     Head: Normocephalic and atraumatic.     Right Ear: External ear normal.     Left Ear: External ear normal.     Nose: Nose normal. No mucosal edema or rhinorrhea.     Mouth/Throat:     Dentition: No dental abscesses.     Pharynx: No uvula swelling.  Eyes:     Extraocular Movements: Extraocular movements intact.     Right eye: No nystagmus.     Left eye: No nystagmus.     Conjunctiva/sclera: Conjunctivae normal.     Pupils: Pupils are equal, round, and reactive to light.  Cardiovascular:     Rate and Rhythm: Normal rate and regular rhythm.     Heart sounds: Normal heart sounds. No murmur. No friction rub. No gallop.   Pulmonary:     Effort: Pulmonary effort is normal. No respiratory distress.     Breath sounds: Normal breath sounds. No wheezing, rhonchi or rales.  Chest:     Chest wall: No tenderness or crepitus.  Musculoskeletal:        General: No tenderness. Normal range of motion.     Cervical back: Full passive range of motion without pain, normal range of motion and neck supple.     Comments: Moves all extremities well.   Skin:    General: Skin is warm and dry.     Coloration: Skin is not pale.     Findings: No erythema or rash.  Neurological:     General: No focal deficit present.     Mental Status: He is alert and oriented to person, place, and time.     Cranial Nerves: No cranial nerve deficit.     Comments: Cranial nerves II through XII are intact, grips are equal, there is no pronator drift, he has straight leg raising bilaterally without difficulty  Psychiatric:        Mood and Affect: Mood normal. Mood is not anxious.        Speech: Speech normal.        Behavior: Behavior normal.        Thought Content: Thought content normal.     ED Results / Procedures / Treatments   Labs (all labs ordered are listed, but only abnormal results are displayed) Results for orders placed or performed during the hospital  encounter of 01/03/20  Basic metabolic panel  Result Value Ref Range   Sodium 143 135 - 145 mmol/L  Potassium 4.1 3.5 - 5.1 mmol/L   Chloride 107 98 - 111 mmol/L   CO2 28 22 - 32 mmol/L   Glucose, Bld 100 (H) 70 - 99 mg/dL   BUN 18 8 - 23 mg/dL   Creatinine, Ser 3.66 0.61 - 1.24 mg/dL   Calcium 9.9 8.9 - 44.0 mg/dL   GFR calc non Af Amer >60 >60 mL/min   GFR calc Af Amer >60 >60 mL/min   Anion gap 8 5 - 15  CBC  Result Value Ref Range   WBC 3.7 (L) 4.0 - 10.5 K/uL   RBC 4.79 4.22 - 5.81 MIL/uL   Hemoglobin 13.6 13.0 - 17.0 g/dL   HCT 34.7 42.5 - 95.6 %   MCV 90.8 80.0 - 100.0 fL   MCH 28.4 26.0 - 34.0 pg   MCHC 31.3 30.0 - 36.0 g/dL   RDW 38.7 56.4 - 33.2 %   Platelets 175 150 - 400 K/uL   nRBC 0.0 0.0 - 0.2 %  Urinalysis, Routine w reflex microscopic  Result Value Ref Range   Color, Urine YELLOW YELLOW   APPearance CLEAR CLEAR   Specific Gravity, Urine 1.019 1.005 - 1.030   pH 6.0 5.0 - 8.0   Glucose, UA NEGATIVE NEGATIVE mg/dL   Hgb urine dipstick NEGATIVE NEGATIVE   Bilirubin Urine NEGATIVE NEGATIVE   Ketones, ur 5 (A) NEGATIVE mg/dL   Protein, ur 30 (A) NEGATIVE mg/dL   Nitrite NEGATIVE NEGATIVE   Leukocytes,Ua NEGATIVE NEGATIVE   RBC / HPF 0-5 0 - 5 RBC/hpf   WBC, UA 0-5 0 - 5 WBC/hpf   Bacteria, UA NONE SEEN NONE SEEN   Mucus PRESENT   CBG monitoring, ED  Result Value Ref Range   Glucose-Capillary 85 70 - 99 mg/dL   Laboratory interpretation all normal except low total white blood cell count without neutropenia  Status:  Final result Visible to patient:  No (inaccessible in MyChart) Next appt:  None Dx:  Suspected 2019 novel coronavirus infe... Specimen Information: Nasopharyngeal(NP) swabs in vial transport medium   NASOPHARYNGE TESTING      Ref Range & Units 8 d ago  SARS-CoV-2, NAA Not Detected DetectedAbnormal           EKG EKG Interpretation  Date/Time:  Saturday January 04 2020 00:17:55 EST Ventricular Rate:  69 PR Interval:    QRS  Duration: 91 QT Interval:  401 QTC Calculation: 430 R Axis:   74 Text Interpretation: Sinus rhythm Baseline wander in lead(s) V5 Since last tracing rate slower 03 Jan 2020 Confirmed by Devoria Albe ([id number]) on 01/04/2020 12:29:27 AM   Radiology CT Head Wo Contrast  CT CEREBRAL PERFUSION W CONTRAST  Result Date: 01/04/2020 CLINICAL DATA:  Initial evaluation for acute blurry vision, dizziness. EXAM: CT HEAD WITHOUT CONTRAST CT PERFUSION BRAIN TECHNIQUE: Multiphase CT imaging of the brain was performed following IV bolus contrast injection. Subsequent parametric perfusion maps were calculated using RAPID software. CONTRAST:  67mL OMNIPAQUE IOHEXOL 350 MG/ML SOLN COMPARISON:  None available. FINDINGS: CT HEAD FINDINGS: Brain: Generalized age-related cerebral atrophy. Mild chronic small vessel ischemic disease present within the periventricular deep white matter both cerebral hemispheres. Few small superimposed remote lacunar infarcts present within the right basal ganglia. Small remote right cerebellar infarct. No evidence for acute intracranial hemorrhage. No findings to suggest acute large vessel territory infarct. No mass lesion, midline shift, or mass effect. Ventricles are normal in size without evidence for hydrocephalus. No extra-axial fluid collection identified. Vascular: No hyperdense vessel  identified.Scattered vascular calcifications noted within the carotid siphons. Skull: Scalp soft tissues demonstrate no acute abnormality. Calvarium intact. Sinuses/Orbits: Globes and orbital soft tissues within normal limits. Scattered mucosal thickening seen within the ethmoidal air cells and maxillary sinuses. Paranasal sinuses are otherwise clear. No mastoid effusion. CT Brain Perfusion Findings: CBF (<30%) Volume: 97mL Perfusion (Tmax>6.0s) volume: 36mL Mismatch Volume: 53mL Infarct Core: 0 mL Infarction Location:Negative CT perfusion with no evidence for acute core infarct or perfusion deficit. IMPRESSION: CT  HEAD IMPRESSION: 1. No acute intracranial abnormality. 2. Age-related cerebral atrophy with chronic small vessel ischemic disease, with a few small remote lacunar infarcts involving the right basal ganglia and right cerebellum. CT PERFUSION IMPRESSION: Negative CT perfusion. No evidence for acute core infarct or other perfusion abnormality. Electronically Signed   By: Rise Mu M.D.   On: 01/04/2020 06:31    Procedures Procedures (including critical care time)  Medications Ordered in ED Medications  iohexol (OMNIPAQUE) 350 MG/ML injection 40 mL (40 mLs Intravenous Contrast Given 01/04/20 0557)    ED Course  I have reviewed the triage vital signs and the nursing notes.  Pertinent labs & imaging results that were available during my care of the patient were reviewed by me and considered in my medical decision making (see chart for details).    MDM Rules/Calculators/A&P                      Patient symptoms sound like vertigo however with the blurred vision that makes me more concerned that he may have had a TIA.  CT of the head and CT perfusion study was done.`  Patient had no further episodes of dizziness in the ED.  At time of discharge his CT head and CTA perfusion did not show evidence of an acute infarct, he was discharged home with instructions for vertigo.     Final Clinical Impression(s) / ED Diagnoses Final diagnoses:  Vertigo    Rx / DC Orders ED Discharge Orders         Ordered    meclizine (ANTIVERT) 25 MG tablet  3 times daily PRN     01/04/20 0700    ondansetron (ZOFRAN) 4 MG tablet  Every 8 hours PRN     01/04/20 0700          Plan discharge  Devoria Albe, MD, Concha Pyo, MD 01/04/20 [phone]

## 2020-01-25 ENCOUNTER — Ambulatory Visit: Payer: Medicare Other | Attending: Internal Medicine

## 2020-01-25 DIAGNOSIS — Z23 Encounter for immunization: Secondary | ICD-10-CM | POA: Insufficient documentation

## 2020-02-25 ENCOUNTER — Ambulatory Visit: Payer: Medicare Other | Attending: Internal Medicine

## 2020-02-25 DIAGNOSIS — Z23 Encounter for immunization: Secondary | ICD-10-CM

## 2020-02-25 NOTE — Progress Notes (Signed)
   Covid-19 Vaccination Clinic  Name:  Dustin Chandler    MRN: [id number] DOB: [birth year]  02/25/2020  [name] was observed post Covid-19 immunization for 30 minutes based on pre-vaccination screening without incident. He was provided with Vaccine Information Sheet and instruction to access the V-Safe system.   [name] was instructed to call 911 with any severe reactions post vaccine: Marland Kitchen Difficulty breathing  . Swelling of face and throat  . A fast heartbeat  . A bad rash all over body  . Dizziness and weakness   Immunizations Administered    Name Date Dose VIS Date Route   Moderna COVID-19 Vaccine 02/25/2020 10:49 AM 0.5 mL 11/19/2019 Intramuscular   Manufacturer: Moderna   Lot: [id number]   NDC: 39179-217-83

## 2021-10-01 ENCOUNTER — Other Ambulatory Visit: Payer: Self-pay

## 2021-10-01 ENCOUNTER — Encounter (HOSPITAL_COMMUNITY): Payer: Self-pay | Admitting: *Deleted

## 2021-10-01 ENCOUNTER — Inpatient Hospital Stay (HOSPITAL_COMMUNITY): Payer: No Typology Code available for payment source

## 2021-10-01 ENCOUNTER — Emergency Department (HOSPITAL_COMMUNITY): Payer: No Typology Code available for payment source

## 2021-10-01 ENCOUNTER — Inpatient Hospital Stay (HOSPITAL_COMMUNITY)
Admission: EM | Admit: 2021-10-01 | Discharge: 2021-10-03 | DRG: 066 | Disposition: A | Discharge status: Home Health | Payer: No Typology Code available for payment source | Attending: Family Medicine | Admitting: Family Medicine

## 2021-10-01 DIAGNOSIS — Z8673 Personal history of transient ischemic attack (TIA), and cerebral infarction without residual deficits: Secondary | ICD-10-CM

## 2021-10-01 DIAGNOSIS — G5621 Lesion of ulnar nerve, right upper limb: Secondary | ICD-10-CM | POA: Diagnosis present

## 2021-10-01 DIAGNOSIS — Z79899 Other long term (current) drug therapy: Secondary | ICD-10-CM | POA: Diagnosis not present

## 2021-10-01 DIAGNOSIS — I639 Cerebral infarction, unspecified: Secondary | ICD-10-CM | POA: Diagnosis present

## 2021-10-01 DIAGNOSIS — Z7984 Long term (current) use of oral hypoglycemic drugs: Secondary | ICD-10-CM | POA: Diagnosis not present

## 2021-10-01 DIAGNOSIS — F4310 Post-traumatic stress disorder, unspecified: Secondary | ICD-10-CM | POA: Diagnosis present

## 2021-10-01 DIAGNOSIS — E1141 Type 2 diabetes mellitus with diabetic mononeuropathy: Secondary | ICD-10-CM | POA: Diagnosis present

## 2021-10-01 DIAGNOSIS — Z20822 Contact with and (suspected) exposure to covid-19: Secondary | ICD-10-CM | POA: Diagnosis present

## 2021-10-01 DIAGNOSIS — Z823 Family history of stroke: Secondary | ICD-10-CM | POA: Diagnosis not present

## 2021-10-01 DIAGNOSIS — Z23 Encounter for immunization: Secondary | ICD-10-CM | POA: Diagnosis not present

## 2021-10-01 DIAGNOSIS — I63532 Cerebral infarction due to unspecified occlusion or stenosis of left posterior cerebral artery: Secondary | ICD-10-CM

## 2021-10-01 DIAGNOSIS — I6381 Other cerebral infarction due to occlusion or stenosis of small artery: Secondary | ICD-10-CM | POA: Diagnosis not present

## 2021-10-01 DIAGNOSIS — Z87891 Personal history of nicotine dependence: Secondary | ICD-10-CM | POA: Diagnosis not present

## 2021-10-01 DIAGNOSIS — E1142 Type 2 diabetes mellitus with diabetic polyneuropathy: Secondary | ICD-10-CM | POA: Diagnosis present

## 2021-10-01 DIAGNOSIS — E538 Deficiency of other specified B group vitamins: Secondary | ICD-10-CM | POA: Diagnosis present

## 2021-10-01 DIAGNOSIS — Z8249 Family history of ischemic heart disease and other diseases of the circulatory system: Secondary | ICD-10-CM | POA: Diagnosis not present

## 2021-10-01 DIAGNOSIS — R200 Anesthesia of skin: Secondary | ICD-10-CM | POA: Diagnosis present

## 2021-10-01 DIAGNOSIS — I6389 Other cerebral infarction: Secondary | ICD-10-CM | POA: Diagnosis not present

## 2021-10-01 DIAGNOSIS — Z888 Allergy status to other drugs, medicaments and biological substances status: Secondary | ICD-10-CM

## 2021-10-01 DIAGNOSIS — R29703 NIHSS score 3: Secondary | ICD-10-CM | POA: Diagnosis present

## 2021-10-01 DIAGNOSIS — I10 Essential (primary) hypertension: Secondary | ICD-10-CM | POA: Diagnosis present

## 2021-10-01 DIAGNOSIS — E1165 Type 2 diabetes mellitus with hyperglycemia: Secondary | ICD-10-CM | POA: Diagnosis present

## 2021-10-01 DIAGNOSIS — Z880 Allergy status to penicillin: Secondary | ICD-10-CM

## 2021-10-01 DIAGNOSIS — E119 Type 2 diabetes mellitus without complications: Secondary | ICD-10-CM

## 2021-10-01 DIAGNOSIS — Z8719 Personal history of other diseases of the digestive system: Secondary | ICD-10-CM | POA: Diagnosis not present

## 2021-10-01 DIAGNOSIS — E785 Hyperlipidemia, unspecified: Secondary | ICD-10-CM | POA: Diagnosis present

## 2021-10-01 DIAGNOSIS — G629 Polyneuropathy, unspecified: Secondary | ICD-10-CM | POA: Diagnosis not present

## 2021-10-01 DIAGNOSIS — E1159 Type 2 diabetes mellitus with other circulatory complications: Secondary | ICD-10-CM

## 2021-10-01 DIAGNOSIS — G608 Other hereditary and idiopathic neuropathies: Secondary | ICD-10-CM | POA: Diagnosis present

## 2021-10-01 HISTORY — DX: Cerebral infarction, unspecified: I63.9

## 2021-10-01 LAB — DIFFERENTIAL
Abs Immature Granulocytes: 0 10*3/uL (ref 0.00–0.07)
Basophils Absolute: 0 10*3/uL (ref 0.0–0.1)
Basophils Relative: 1 %
Eosinophils Absolute: 0 10*3/uL (ref 0.0–0.5)
Eosinophils Relative: 1 %
Immature Granulocytes: 0 %
Lymphocytes Relative: 35 %
Lymphs Abs: 1.1 10*3/uL (ref 0.7–4.0)
Monocytes Absolute: 0.3 10*3/uL (ref 0.1–1.0)
Monocytes Relative: 10 %
Neutro Abs: 1.6 10*3/uL — ABNORMAL LOW (ref 1.7–7.7)
Neutrophils Relative %: 53 %

## 2021-10-01 LAB — RESP PANEL BY RT-PCR (FLU A&B, COVID) ARPGX2
Influenza A by PCR: NEGATIVE
Influenza B by PCR: NEGATIVE
SARS Coronavirus 2 by RT PCR: NEGATIVE

## 2021-10-01 LAB — COMPREHENSIVE METABOLIC PANEL
ALT: 11 U/L (ref 0–44)
AST: 17 U/L (ref 15–41)
Albumin: 4.3 g/dL (ref 3.5–5.0)
Alkaline Phosphatase: 58 U/L (ref 38–126)
Anion gap: 5 (ref 5–15)
BUN: 18 mg/dL (ref 8–23)
CO2: 29 mmol/L (ref 22–32)
Calcium: 10 mg/dL (ref 8.9–10.3)
Chloride: 106 mmol/L (ref 98–111)
Creatinine, Ser: 0.76 mg/dL (ref 0.61–1.24)
GFR, Estimated: >60 mL/min (ref >60)
Glucose, Bld: 104 mg/dL — ABNORMAL HIGH (ref 70–99)
Potassium: 4.5 mmol/L (ref 3.5–5.1)
Sodium: 140 mmol/L (ref 135–145)
Total Bilirubin: 0.9 mg/dL (ref 0.3–1.2)
Total Protein: 7.7 g/dL (ref 6.5–8.1)

## 2021-10-01 LAB — RAPID URINE DRUG SCREEN, HOSP PERFORMED
Amphetamines: NOT DETECTED
Barbiturates: NOT DETECTED
Benzodiazepines: NOT DETECTED
Cocaine: NOT DETECTED
Opiates: NOT DETECTED
Tetrahydrocannabinol: NOT DETECTED

## 2021-10-01 LAB — PROTIME-INR
INR: 1 (ref 0.8–1.2)
Prothrombin Time: 13.4 seconds (ref 11.4–15.2)

## 2021-10-01 LAB — CBC
HCT: 44.1 % (ref 39.0–52.0)
Hemoglobin: 14.2 g/dL (ref 13.0–17.0)
MCH: 29.6 pg (ref 26.0–34.0)
MCHC: 32.2 g/dL (ref 30.0–36.0)
MCV: 91.9 fL (ref 80.0–100.0)
Platelets: 136 10*3/uL — ABNORMAL LOW (ref 150–400)
RBC: 4.8 MIL/uL (ref 4.22–5.81)
RDW: 13.2 % (ref 11.5–15.5)
WBC: 3.1 10*3/uL — ABNORMAL LOW (ref 4.0–10.5)
nRBC: 0 % (ref 0.0–0.2)

## 2021-10-01 LAB — APTT: aPTT: 27 seconds (ref 24–36)

## 2021-10-01 LAB — CBG MONITORING, ED: Glucose-Capillary: 131 mg/dL — ABNORMAL HIGH (ref 70–99)

## 2021-10-01 LAB — URINALYSIS, ROUTINE W REFLEX MICROSCOPIC
Bilirubin Urine: NEGATIVE
Glucose, UA: NEGATIVE mg/dL
Hgb urine dipstick: NEGATIVE
Ketones, ur: NEGATIVE mg/dL
Leukocytes,Ua: NEGATIVE
Nitrite: NEGATIVE
Protein, ur: NEGATIVE mg/dL
Specific Gravity, Urine: 1.006 (ref 1.005–1.030)
pH: 7 (ref 5.0–8.0)

## 2021-10-01 LAB — ETHANOL: Alcohol, Ethyl (B): <10 mg/dL (ref <10)

## 2021-10-01 IMAGING — MR MR MRA HEAD W/O CM
1 series · 20 of 48 positions shown · non-contrast
Comparison: No pertinent prior exam.

CLINICAL DATA: Neuro deficit, acute, stroke suspected

EXAM:
MRI HEAD WITHOUT CONTRAST
MRA HEAD WITHOUT CONTRAST
TECHNIQUE: Multiplanar, multi-echo pulse sequences of the brain and surrounding
structures were acquired without intravenous contrast. Angiographic
images of the Circle of Willis were acquired using MRA technique
without intravenous contrast.

[Series 100: TOF · axial · 0.4mm · 0.42mm/px · z∈[-83,+20]mm · 20 of 276 slices shown]
[im 1/276]
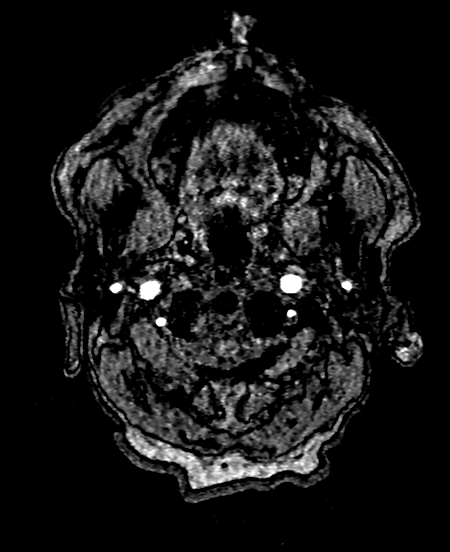
[im 6/276]
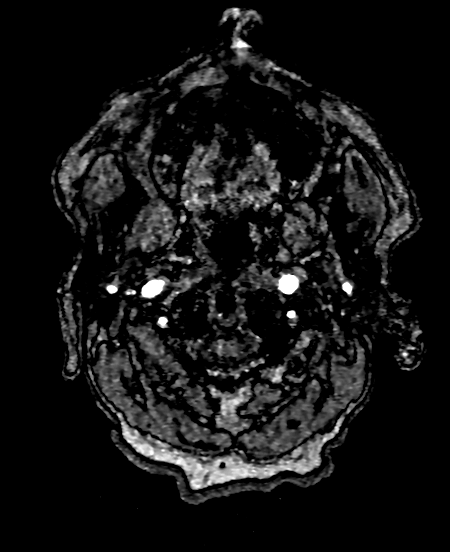
[im 12/276]
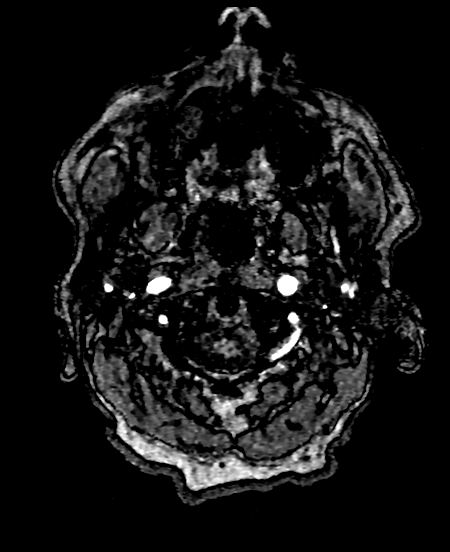
[im 18/276]
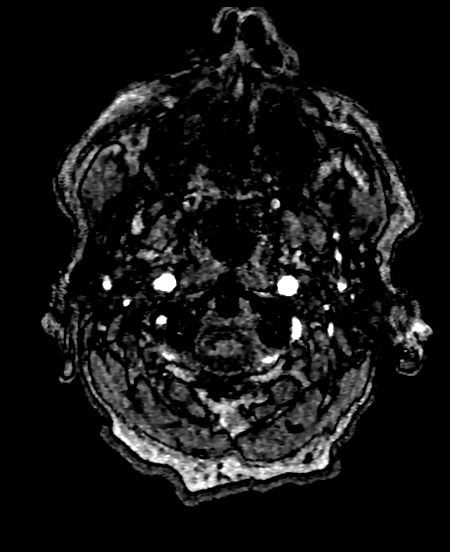
[im 24/276]
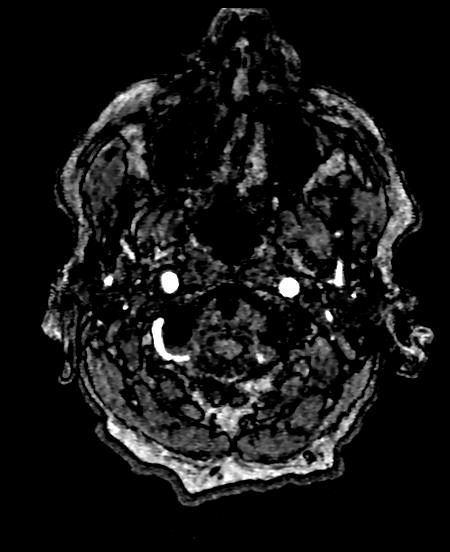
[im 30/276]
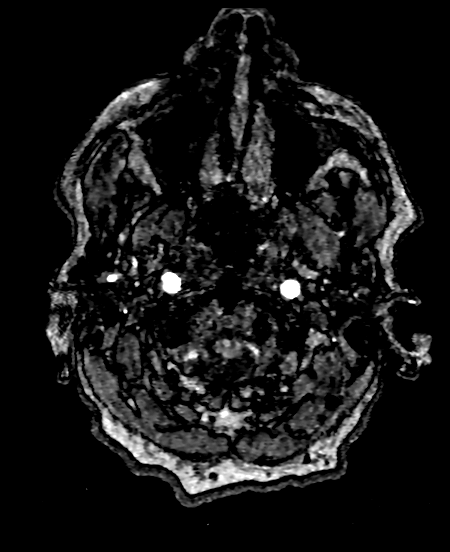
[im 36/276]
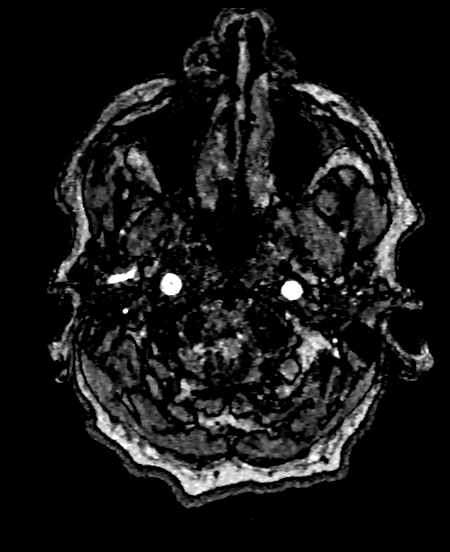
[im 41/276]
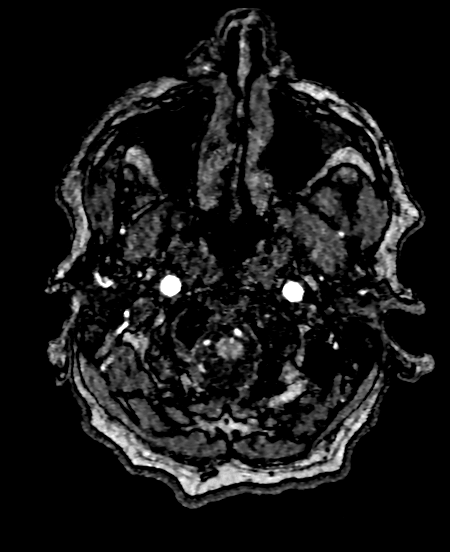
[im 47/276]
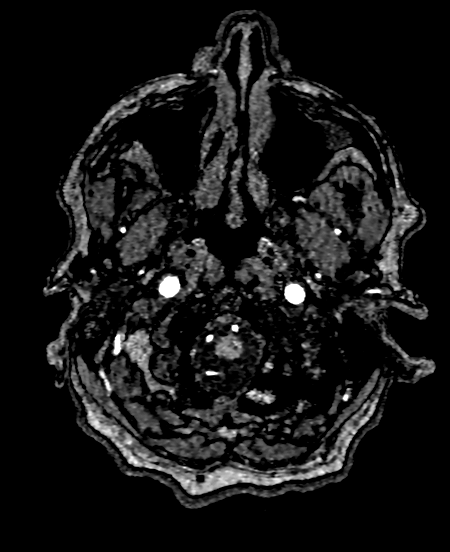
[im 53/276]
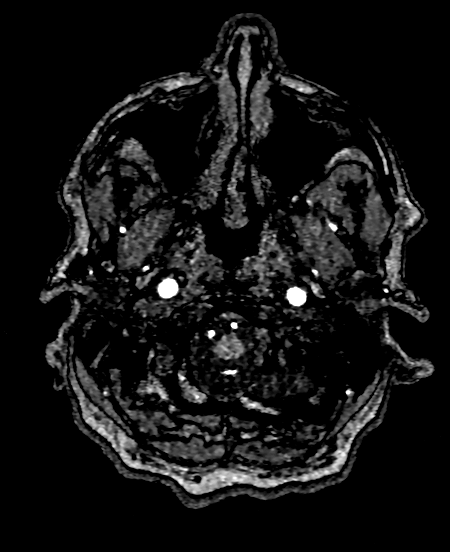
[im 59/276]
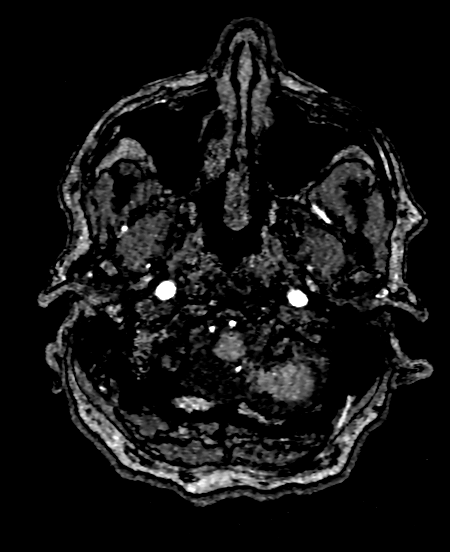
[im 65/276]
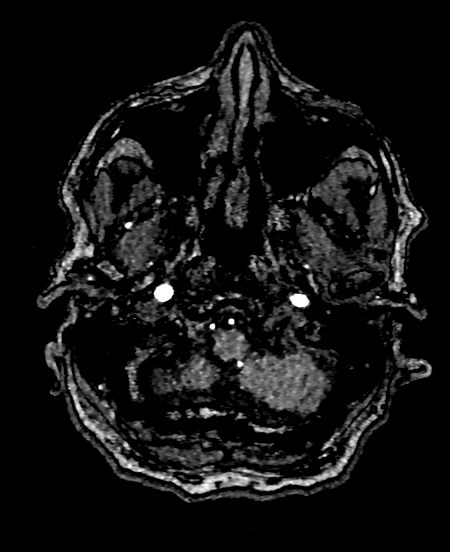
[im 88/276]
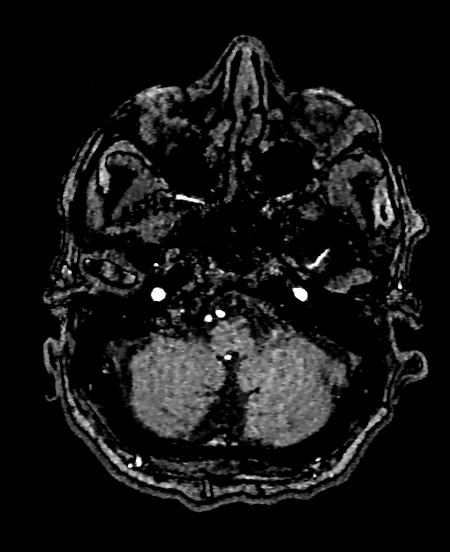
[im 123/276]
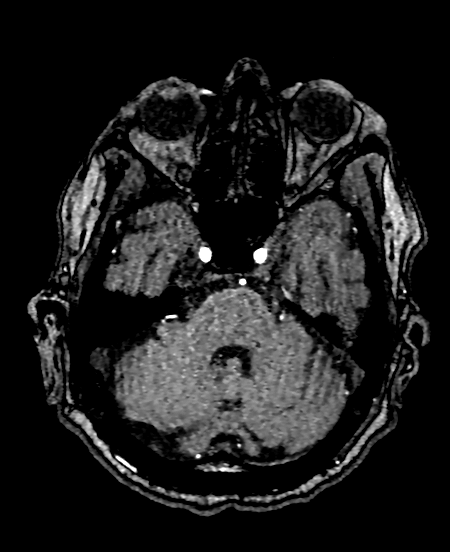
[im 141/276]
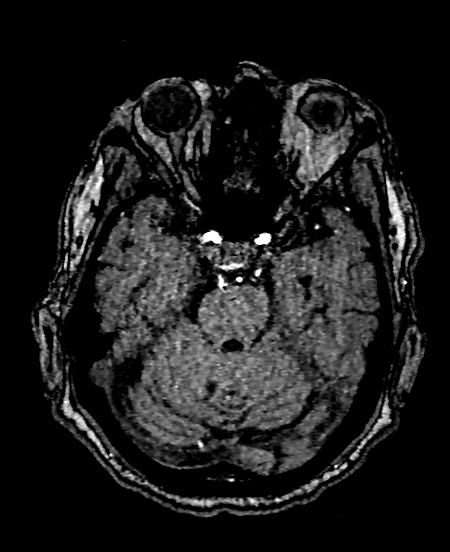
[im 158/276]
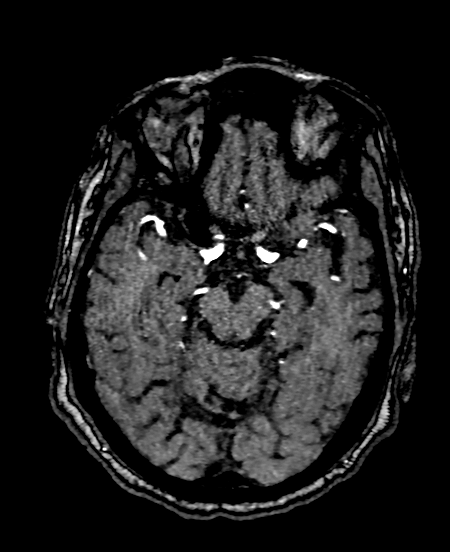
[im 194/276]
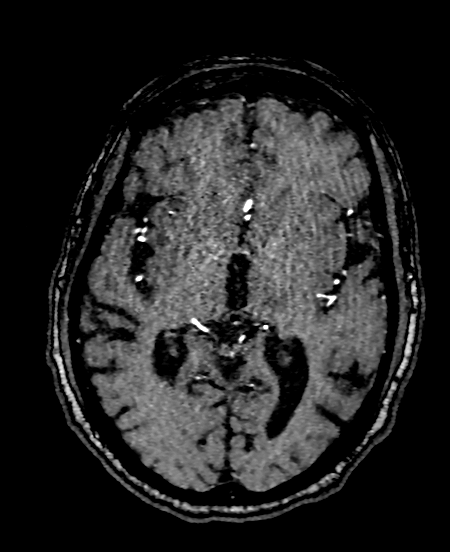
[im 229/276]
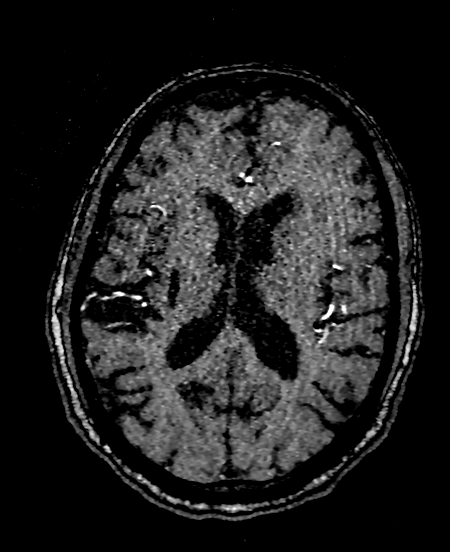
[im 235/276]
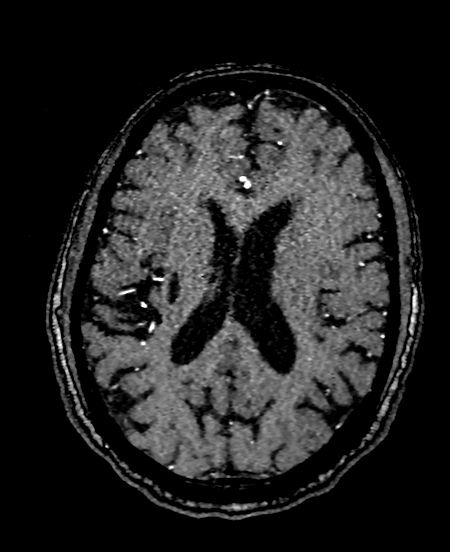
[im 264/276]
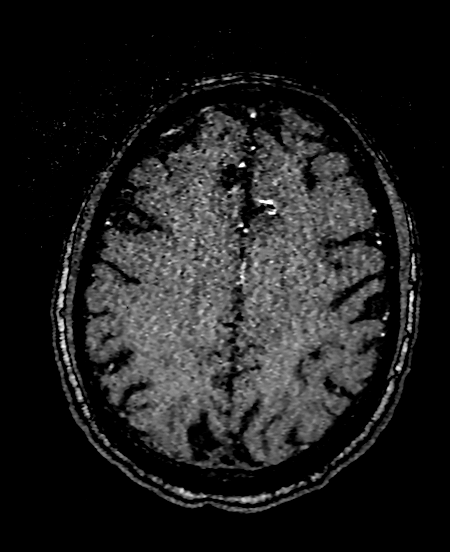

[20 of 48 positions shown; findings below may reference images not displayed]

FINDINGS: MRI HEAD

Brain: There is a 8 mm focus of reduced diffusion within the left
thalamus.

Chronic infarcts of the right lentiform nucleus and corona radiata
with some associated chronic blood products. Small chronic right
precentral gyrus cortical infarct. Small chronic right cerebellar
infarct. Additional patchy T2 hyperintensity in the supratentorial
white matter is nonspecific but may reflect mild chronic
microvascular ischemic changes. Prominence of the ventricles and
sulci reflects mild parenchymal volume loss.

Vascular: Major vessel flow voids at the skull base are preserved.

Skull and upper cervical spine: Normal marrow signal is preserved.

Sinuses/Orbits: Paranasal sinuses are aerated. Orbits are
unremarkable.

Other: Sella is unremarkable.  Mastoid air cells are clear.

MRA HEAD

Intracranial internal carotid arteries are patent. Middle and
anterior cerebral arteries are patent. Mild atherosclerotic
irregularity distal MCA branches. Focal high-grade stenosis distal
right A2 ACA. Intracranial vertebral arteries, basilar artery,
posterior cerebral arteries are patent. Bilateral posterior
communicating arteries are present with fetal origin of the
posterior cerebral arteries. Focal high-grade stenosis left P2 PCA.
No aneurysm.
IMPRESSION: Acute left thalamic infarct.

Chronic infarcts and chronic microvascular ischemic changes.

No large vessel occlusion. Focal high-grade stenoses distal right A2
ACA and left P2 PCA.

## 2021-10-01 IMAGING — CT CT ANGIO HEAD-NECK (W OR W/O PERF)
2 of 7 series · 8 of 33 positions shown · IV contrast (Omnipaque or Isovue)
Comparison: None.

CLINICAL DATA: Extremity numbness.

EXAM:
CT ANGIOGRAPHY HEAD AND NECK
TECHNIQUE: Multidetector CT imaging of the head and neck was performed using
the standard protocol during bolus administration of intravenous
contrast. Multiplanar CT image reconstructions and MIPs were
obtained to evaluate the vascular anatomy. Carotid stenosis
measurements (when applicable) are obtained utilizing NASCET
criteria, using the distal internal carotid diameter as the
denominator.
CONTRAST:  100mL OMNIPAQUE IOHEXOL 350 MG/ML SOLN

[Series 4: cta head & neck · axial · 0.52mm/px · z∈[-51,+67]mm · 2 of 179 slices shown]
[im 60/179  soft-tissue]
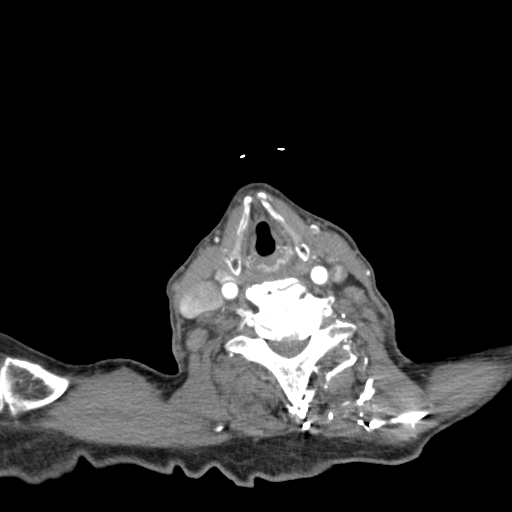
[im 119/179  soft-tissue]
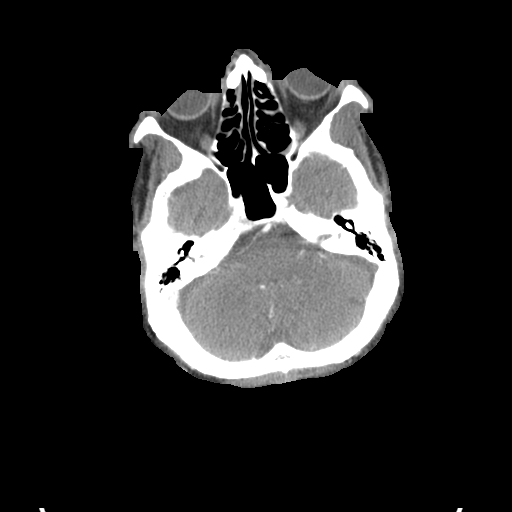

[Series 6: ax thins · axial · 0.39mm/px · z∈[-141,+112]mm · 6 of 357 slices shown]
[im 51/357  soft-tissue]
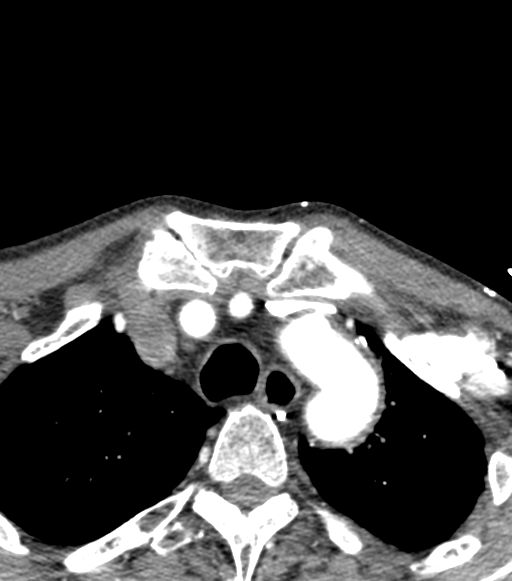
[im 102/357  bone]
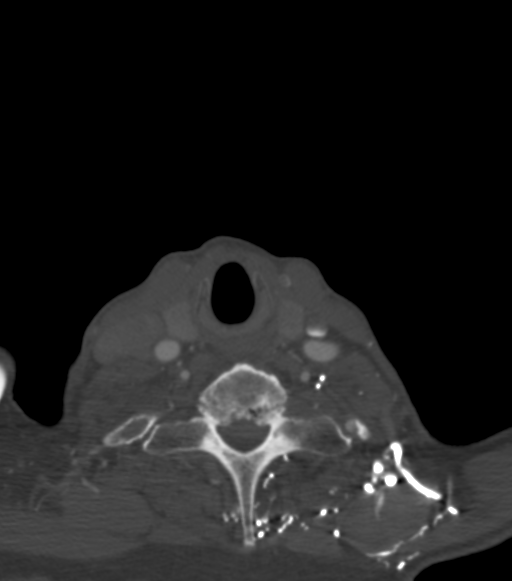
[im 153/357  soft-tissue]
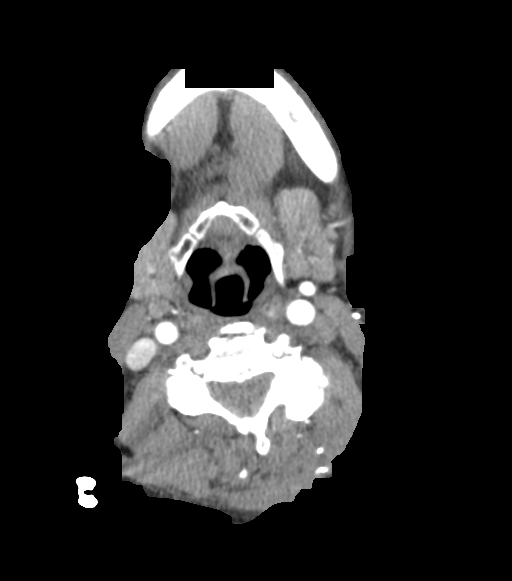
[im 204/357  bone]
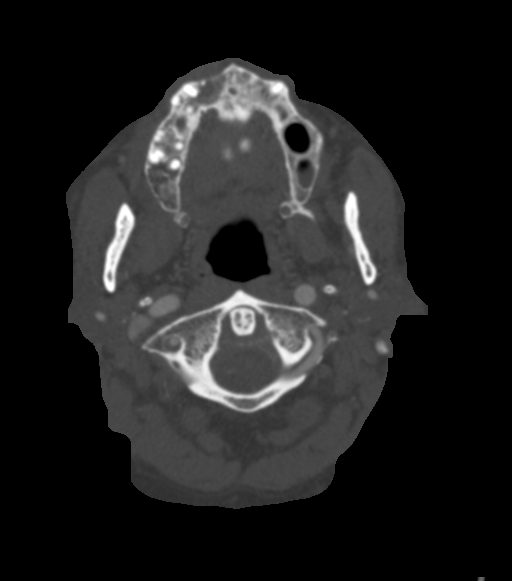
[im 255/357  soft-tissue]
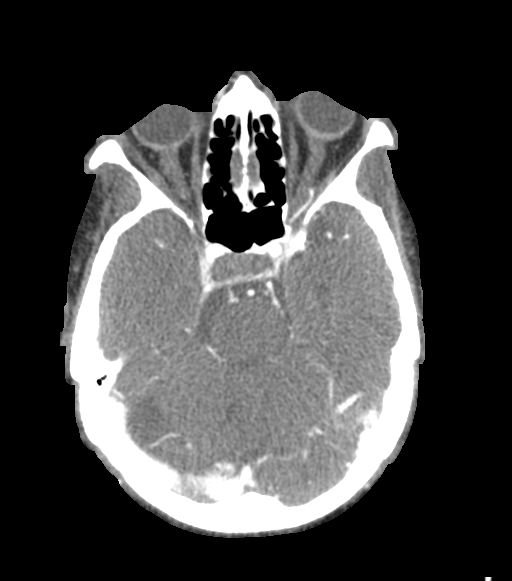
[im 306/357  bone]
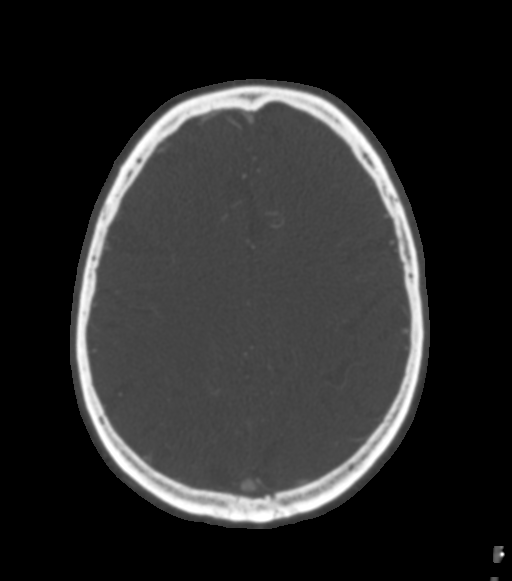

[8 of 33 positions shown; findings below may reference images not displayed]

FINDINGS: CTA NECK FINDINGS

SKELETON: There is no bony spinal canal stenosis. No lytic or
blastic lesion.

OTHER NECK: Normal pharynx, larynx and major salivary glands. No
cervical lymphadenopathy. Unremarkable thyroid gland.

UPPER CHEST: No pneumothorax or pleural effusion. No nodules or
masses.

AORTIC ARCH:

There is calcific atherosclerosis of the aortic arch. There is no
aneurysm, dissection or hemodynamically significant stenosis of the
visualized portion of the aorta. Conventional 3 vessel aortic
branching pattern. The visualized proximal subclavian arteries are
widely patent.

RIGHT CAROTID SYSTEM: No dissection, occlusion or aneurysm. Mild
atherosclerotic calcification at the carotid bifurcation without
hemodynamically significant stenosis.

LEFT CAROTID SYSTEM: Normal without aneurysm, dissection or
stenosis.

VERTEBRAL ARTERIES: Codominant configuration. Both origins are
clearly patent. There is no dissection, occlusion or flow-limiting
stenosis to the skull base (V1-V3 segments).

CTA HEAD FINDINGS

POSTERIOR CIRCULATION:

--Vertebral arteries: Normal V4 segments.

--Inferior cerebellar arteries: Normal.

--Basilar artery: Normal.

--Superior cerebellar arteries: Normal.

--Posterior cerebral arteries (PCA): Normal.

ANTERIOR CIRCULATION:

--Intracranial internal carotid arteries: Normal.

--Anterior cerebral arteries (ACA): Normal. Both A1 segments are
present. Patent anterior communicating artery (a-comm).

--Middle cerebral arteries (MCA): Normal.

VENOUS SINUSES: As permitted by contrast timing, patent.

ANATOMIC VARIANTS: Fetal origins of both posterior cerebral
arteries.

Review of the MIP images confirms the above findings.
IMPRESSION: No emergent large vessel occlusion or high-grade stenosis of the
intracranial or cervical arteries.

Aortic atherosclerosis ([HI]-[HI]).

## 2021-10-01 IMAGING — MR MR HEAD W/O CM
11 of 12 series · 43 of 48 positions shown · non-contrast
Comparison: No pertinent prior exam.

CLINICAL DATA: Neuro deficit, acute, stroke suspected

EXAM:
MRI HEAD WITHOUT CONTRAST
MRA HEAD WITHOUT CONTRAST
TECHNIQUE: Multiplanar, multi-echo pulse sequences of the brain and surrounding
structures were acquired without intravenous contrast. Angiographic
images of the Circle of Willis were acquired using MRA technique
without intravenous contrast.

[Series 5: DWI · axial · 4.0mm · 0.88mm/px · z∈[-76,+69]mm · 4 of 38 slices shown (1 of 4)]
[im 1/38]
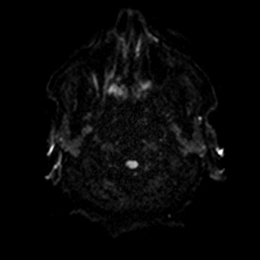
[im 13/38]
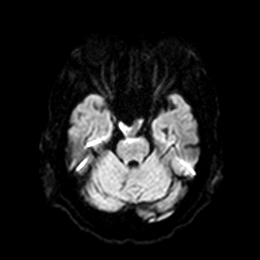
[im 25/38]
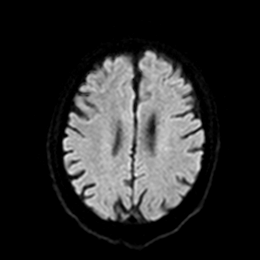
[im 38/38]
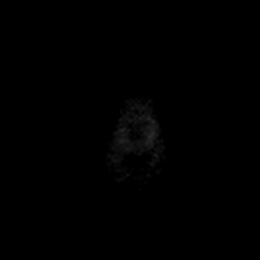

[Series 6: DWI · axial · 4.0mm · 0.88mm/px · z∈[-76,+69]mm · 4 of 38 slices shown (2 of 4)]
[im 1/38]
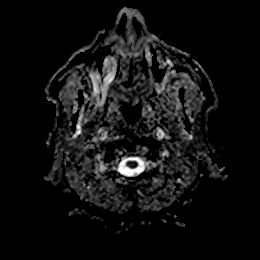
[im 13/38]
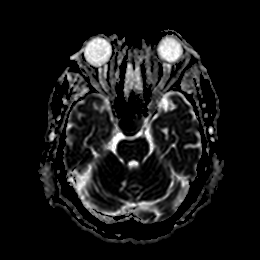
[im 25/38]
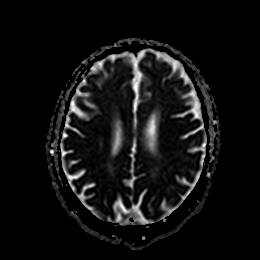
[im 38/38]
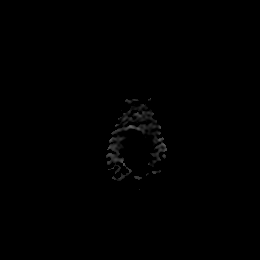

[Series 7: DWI · coronal · 5.0mm · 0.85mm/px · 3 of 32 slices shown (3 of 4)]
[im 1/32]
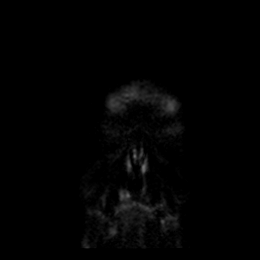
[im 16/32]
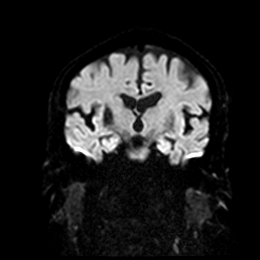
[im 32/32]
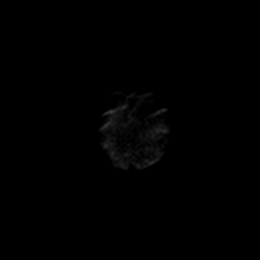

[Series 8: DWI · coronal · 5.0mm · 0.85mm/px · 3 of 32 slices shown (4 of 4)]
[im 1/32]
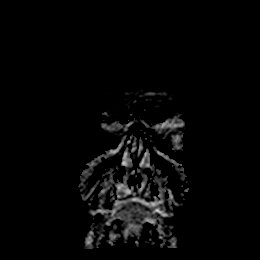
[im 16/32]
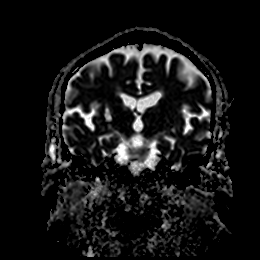
[im 32/32]
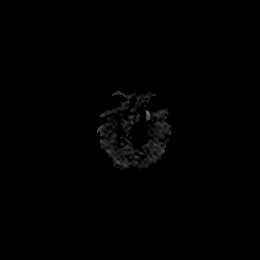

[Series 9: T1 · sagittal · 5.0mm · 0.75mm/px · 2 of 21 slices shown]
[im 1/21]
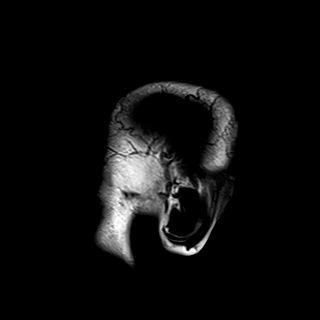
[im 21/21]
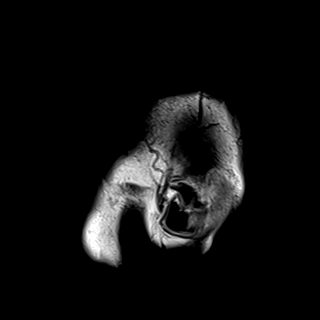

[Series 15: T2 · axial · 5.0mm · 0.72mm/px · z∈[-75,+69]mm · 2 of 22 slices shown (1 of 2)]
[im 1/22]
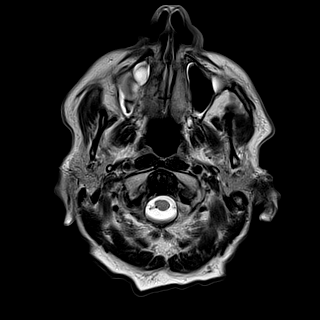
[im 22/22]
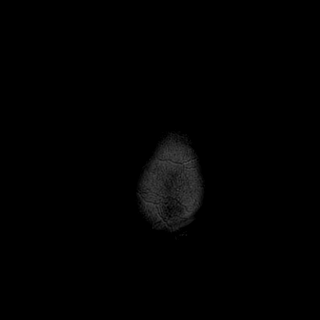

[Series 16: mag_images · axial · 3.0mm · 0.90mm/px · z∈[-85,+77]mm · 6 of 56 slices shown]
[im 1/56]
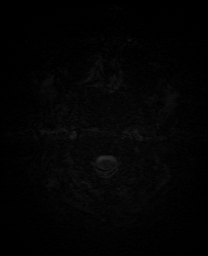
[im 12/56]
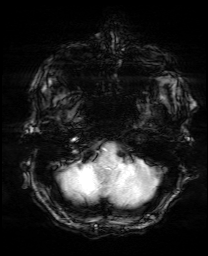
[im 23/56]
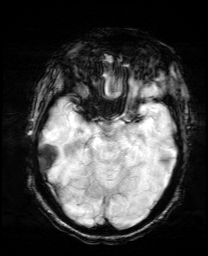
[im 34/56]
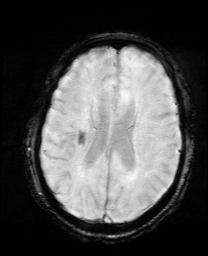
[im 45/56]
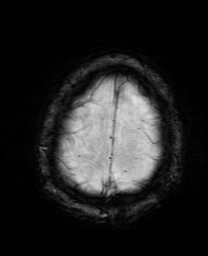
[im 56/56]
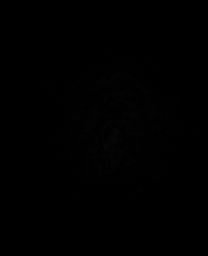

[Series 17: pha_images · axial · 3.0mm · 0.90mm/px · z∈[-85,+74]mm · 6 of 55 slices shown]
[im 1/55]
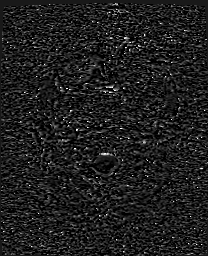
[im 11/55]
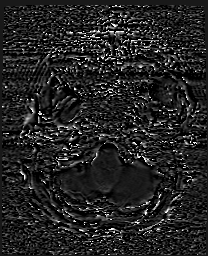
[im 22/55]
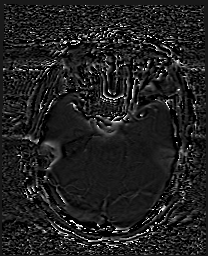
[im 33/55]
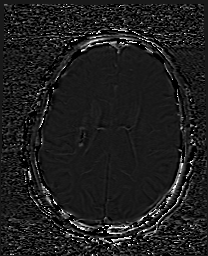
[im 44/55]
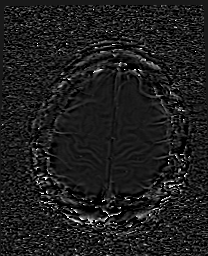
[im 55/55]
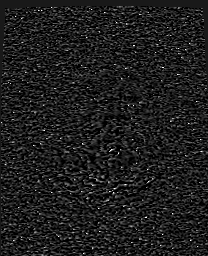

[Series 18: swi_images · axial · 3.0mm · 0.90mm/px · z∈[-85,+77]mm · 6 of 56 slices shown]
[im 1/56]
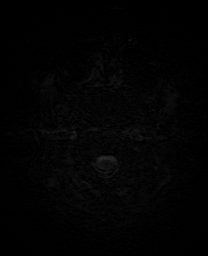
[im 12/56]
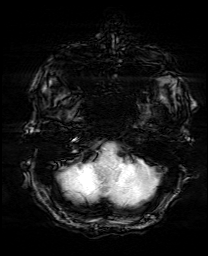
[im 23/56]
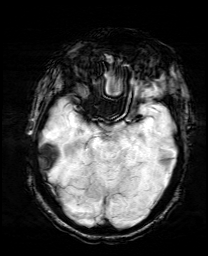
[im 34/56]
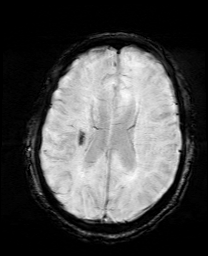
[im 45/56]
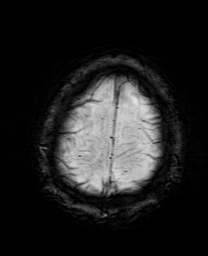
[im 56/56]
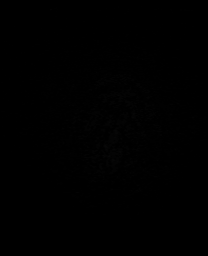

[Series 20: FLAIR · axial · 4.0mm · 0.43mm/px · z∈[-75,+70]mm · 4 of 38 slices shown]
[im 1/38]
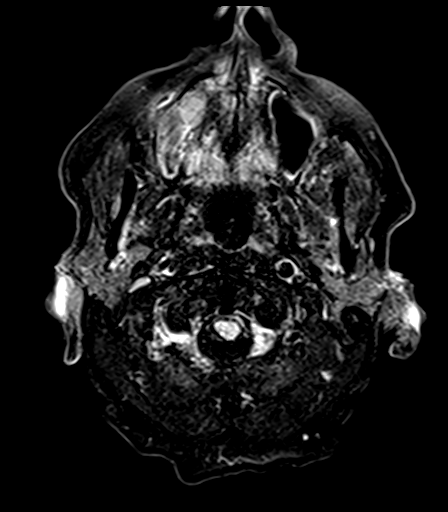
[im 13/38]
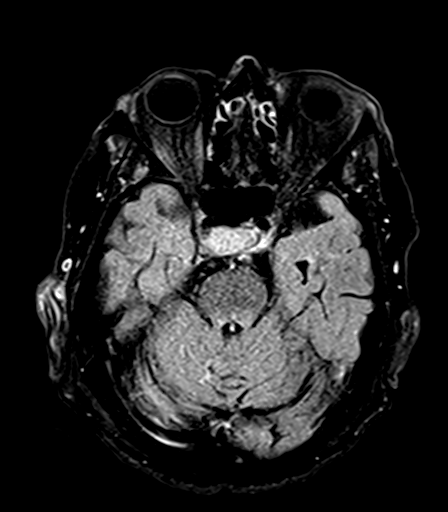
[im 25/38]
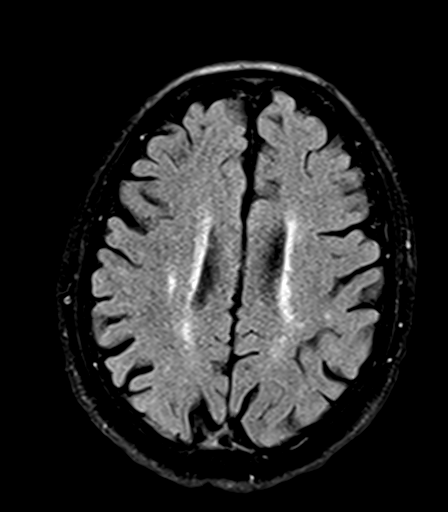
[im 38/38]
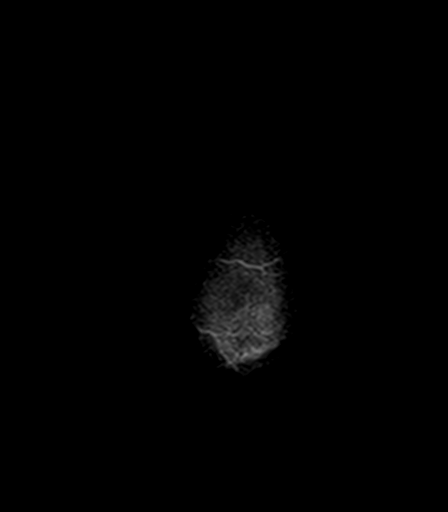

[Series 22: T2 · coronal · 5.0mm · 0.72mm/px · 3 of 30 slices shown (2 of 2)]
[im 1/30]
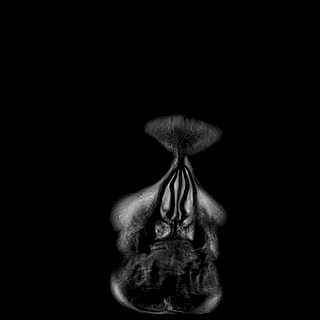
[im 15/30]
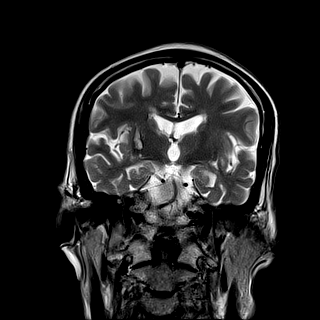
[im 30/30]
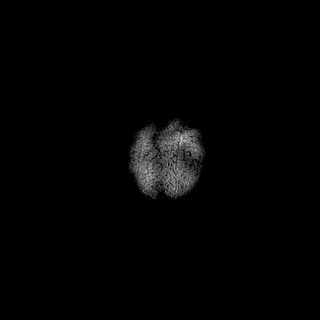

[43 of 48 positions shown; findings below may reference images not displayed]

FINDINGS: MRI HEAD

Brain: There is a 8 mm focus of reduced diffusion within the left
thalamus.

Chronic infarcts of the right lentiform nucleus and corona radiata
with some associated chronic blood products. Small chronic right
precentral gyrus cortical infarct. Small chronic right cerebellar
infarct. Additional patchy T2 hyperintensity in the supratentorial
white matter is nonspecific but may reflect mild chronic
microvascular ischemic changes. Prominence of the ventricles and
sulci reflects mild parenchymal volume loss.

Vascular: Major vessel flow voids at the skull base are preserved.

Skull and upper cervical spine: Normal marrow signal is preserved.

Sinuses/Orbits: Paranasal sinuses are aerated. Orbits are
unremarkable.

Other: Sella is unremarkable.  Mastoid air cells are clear.

MRA HEAD

Intracranial internal carotid arteries are patent. Middle and
anterior cerebral arteries are patent. Mild atherosclerotic
irregularity distal MCA branches. Focal high-grade stenosis distal
right A2 ACA. Intracranial vertebral arteries, basilar artery,
posterior cerebral arteries are patent. Bilateral posterior
communicating arteries are present with fetal origin of the
posterior cerebral arteries. Focal high-grade stenosis left P2 PCA.
No aneurysm.
IMPRESSION: Acute left thalamic infarct.

Chronic infarcts and chronic microvascular ischemic changes.

No large vessel occlusion. Focal high-grade stenoses distal right A2
ACA and left P2 PCA.

## 2021-10-01 IMAGING — CT CT HEAD W/O CM
3 series · 15 of 47 positions shown, 18 images · non-contrast
Comparison: Noncontrast head CT and CT perfusion head [DATE].

CLINICAL DATA: Mental status change, unknown cause. Additional
history provided: Numbness in extremities, history of diabetes
mellitus, hypertension.

EXAM:
CT HEAD WITHOUT CONTRAST
TECHNIQUE: Contiguous axial images were obtained from the base of the skull
through the vertex without intravenous contrast.

[Series 2: head w o · axial · 0.46mm/px · z∈[+20,+145]mm · 9 of 31 slices shown, 12 images]
[im 3/31  brain]
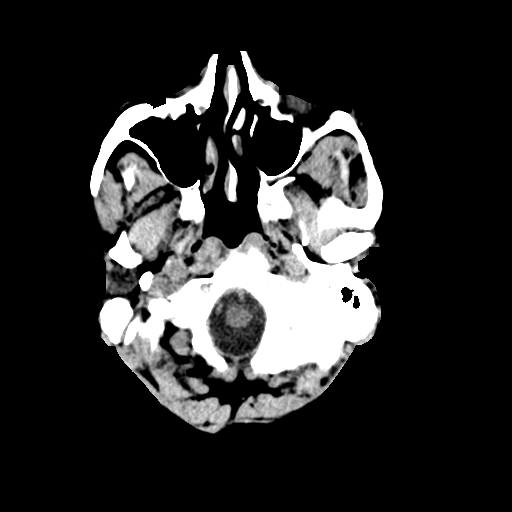
[im 3/31  bone]
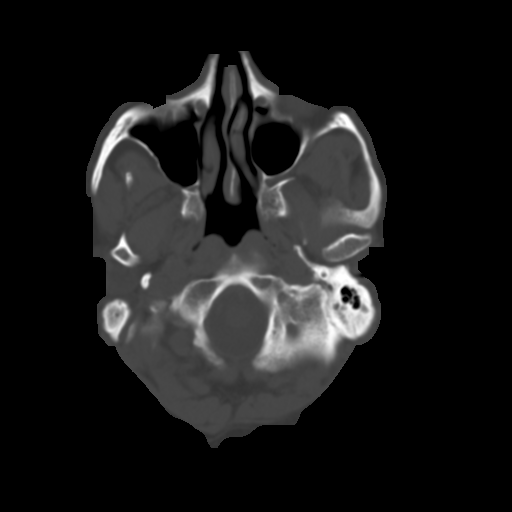
[im 6/31  brain]
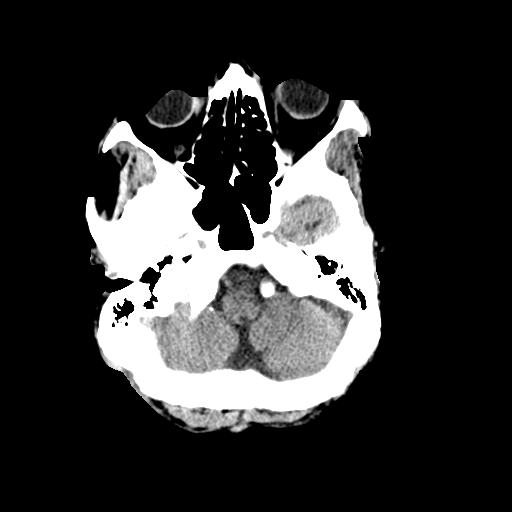
[im 9/31  brain]
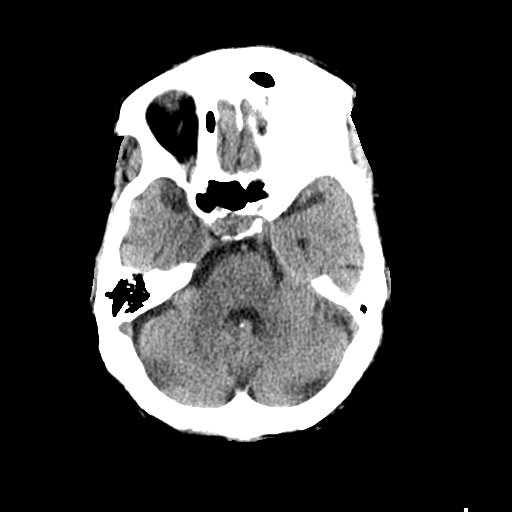
[im 12/31  brain]
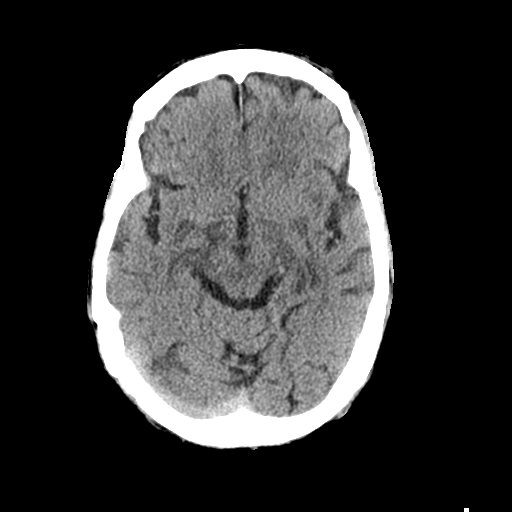
[im 16/31  brain]
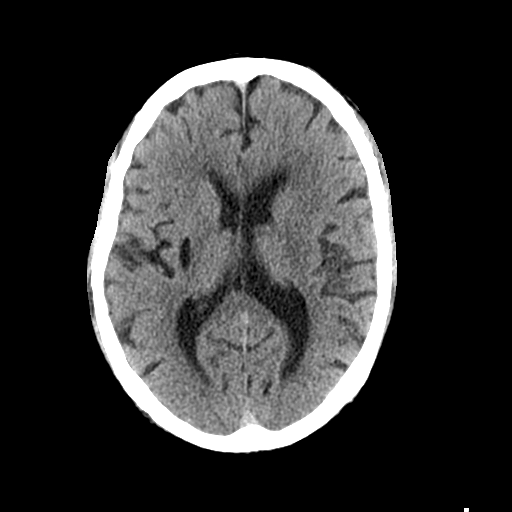
[im 16/31  bone]
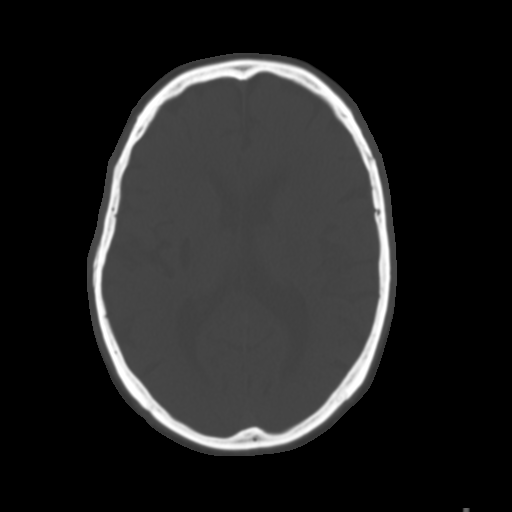
[im 19/31  brain]
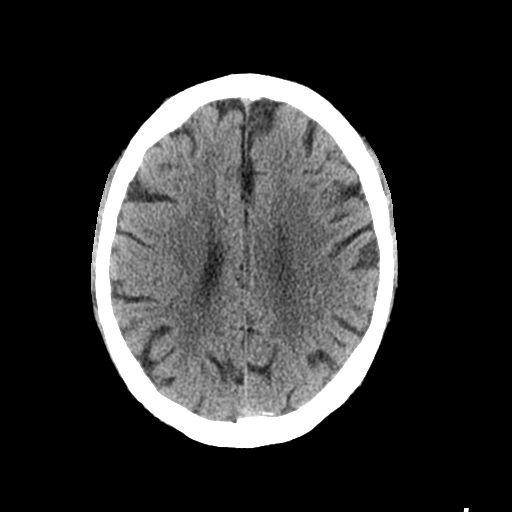
[im 22/31  brain]
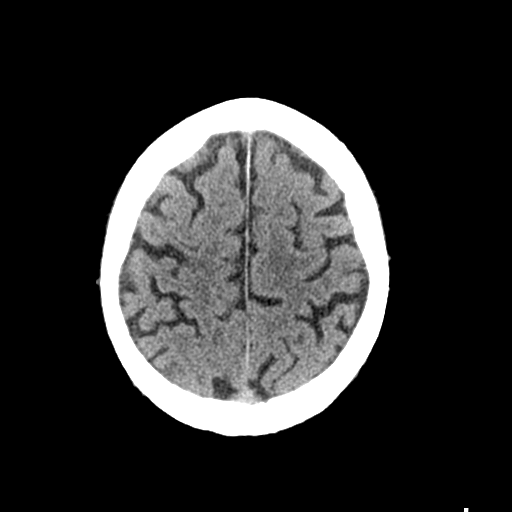
[im 25/31  brain]
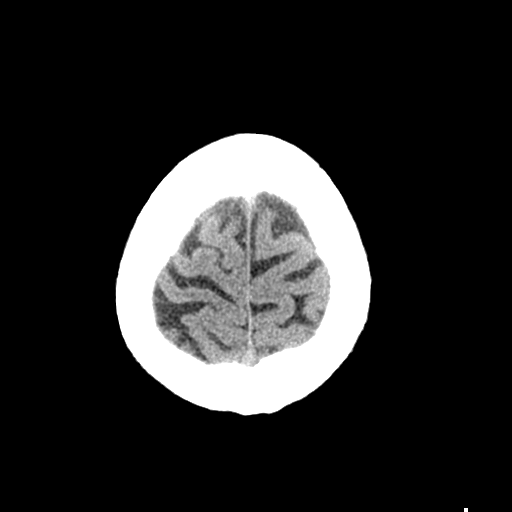
[im 28/31  brain]
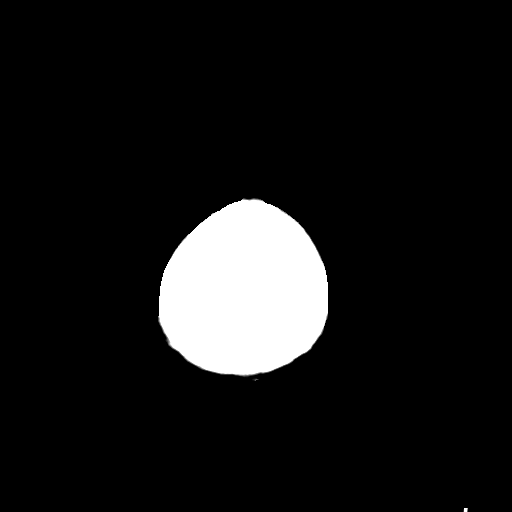
[im 28/31  bone]
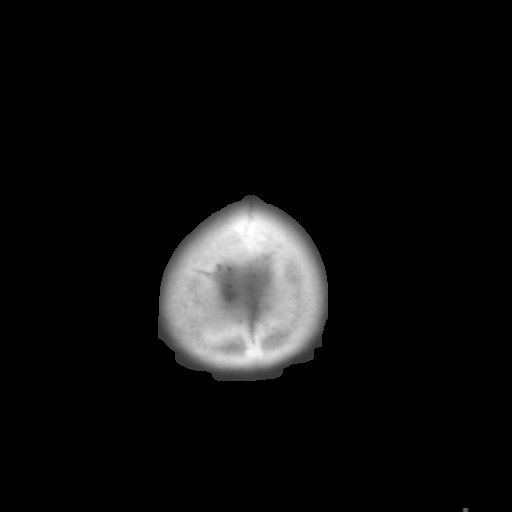

[Series 4: coronal soft · coronal · 0.34mm/px · 3 of 71 slices shown]
[im 24/71  brain]
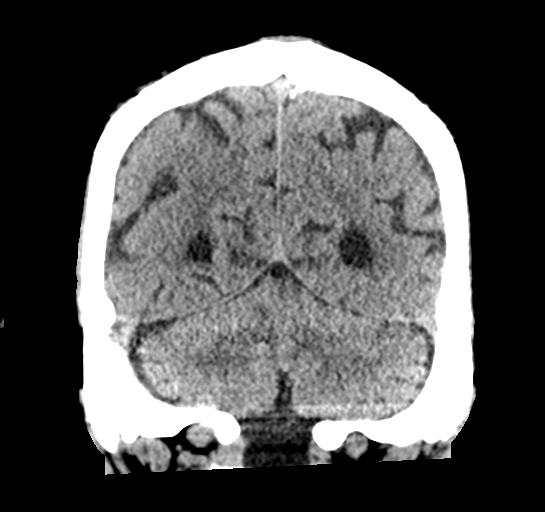
[im 32/71  brain]
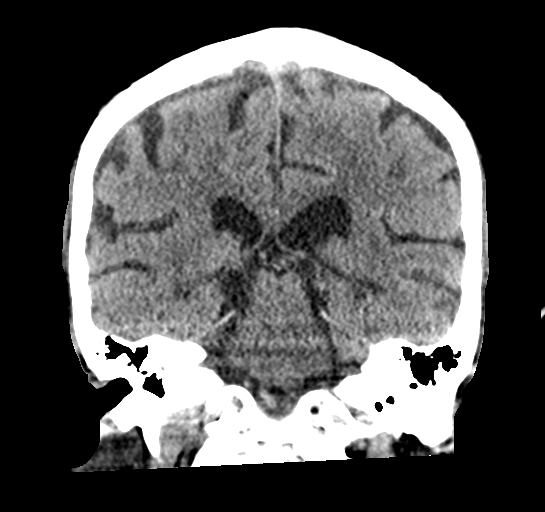
[im 39/71  brain]
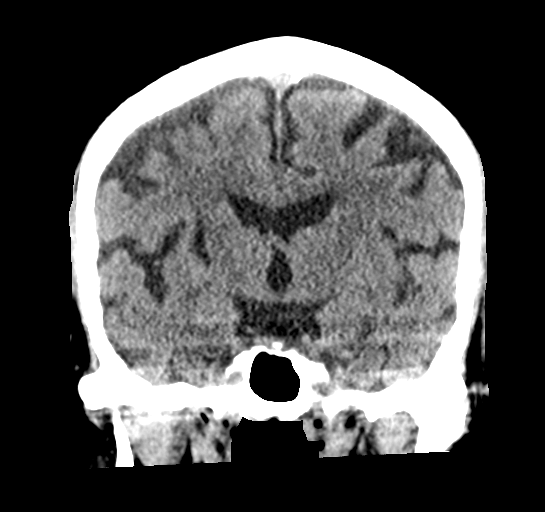

[Series 5: sagittal soft · sagittal · 0.34mm/px · 3 of 62 slices shown]
[im 21/62  brain]
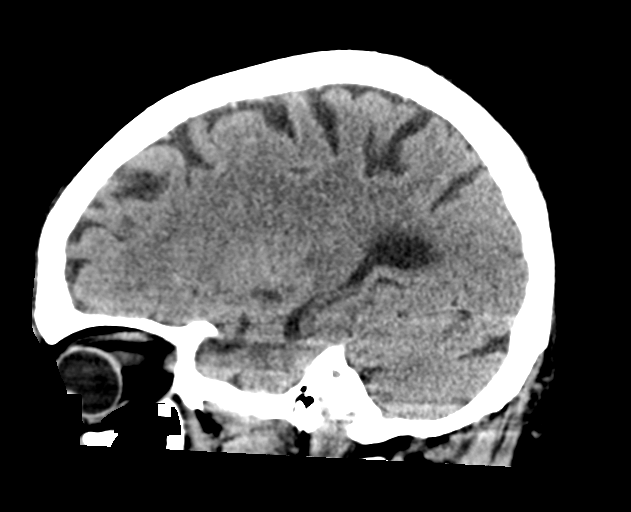
[im 31/62  brain]
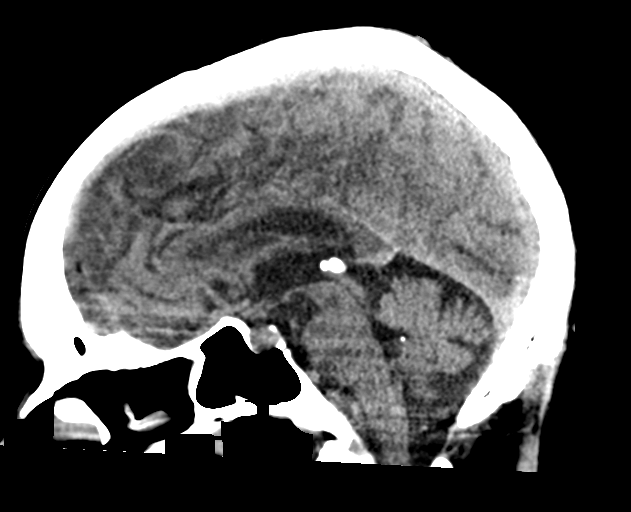
[im 41/62  brain]
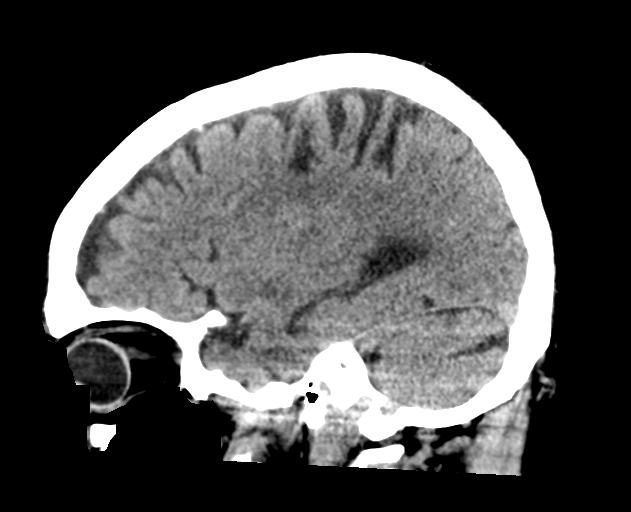

[15 of 47 positions shown; findings below may reference images not displayed]

FINDINGS: Brain:

Mild generalized cerebral atrophy.

Chronic small-vessel infarcts within the right corona radiata/basal
ganglia, progressed in number as compared to the prior head CT of
[DATE].

Background mild patchy and ill-defined hypoattenuation within the
cerebral white matter, nonspecific but compatible chronic small
vessel ischemic disease.

Redemonstrated chronic lacunar infarct within the medial right
cerebellar hemisphere (series 2, image 11).

There is no acute intracranial hemorrhage.

No demarcated cortical infarct.

No extra-axial fluid collection.

No evidence of an intracranial mass.

No midline shift.

Vascular: No hyperdense vessel.  Atherosclerotic calcifications.

Skull: Normal. Negative for fracture or focal lesion.

Sinuses/Orbits: Visualized orbits show no acute finding. Trace
mucosal thickening within the bilateral ethmoid air cells.
Small-volume fluid within a posterior left ethmoid air cell.
IMPRESSION: No evidence of acute intracranial abnormality.

Chronic small-vessel infarcts within the right corona radiata/basal
ganglia, progressed in number as compared to the head CT of
[DATE].

Background mild chronic small vessel ischemic changes within the
cerebral white matter.

Redemonstrated chronic lacunar infarct within the right cerebellar
hemisphere.

Mild generalized cerebral atrophy.

Mild paranasal sinus disease, as described.

## 2021-10-01 MED ORDER — ACETAMINOPHEN 160 MG/5ML PO SOLN: 650.0000 mg | ORAL | Status: DC | PRN

## 2021-10-01 MED ORDER — STROKE: EARLY STAGES OF RECOVERY BOOK
Freq: Once | Status: DC
  Filled 2021-10-01: qty 1

## 2021-10-01 MED ORDER — ASPIRIN 300 MG RE SUPP
300.0000 mg | Freq: Every day | RECTAL | Status: DC
  Filled 2021-10-01: qty 1

## 2021-10-01 MED ORDER — ACETAMINOPHEN 650 MG RE SUPP: 650.0000 mg | RECTAL | Status: DC | PRN

## 2021-10-01 MED ORDER — ACETAMINOPHEN 325 MG PO TABS: 650.0000 mg | ORAL_TABLET | ORAL | Status: DC | PRN

## 2021-10-01 MED ORDER — ASPIRIN 325 MG PO TABS
325.0000 mg | ORAL_TABLET | Freq: Every day | ORAL | Status: DC
  Administered 2021-10-02 – 2021-10-03 (×2): 325 mg via ORAL
  Filled 2021-10-01 (×2): qty 1

## 2021-10-01 MED ORDER — INSULIN ASPART 100 UNIT/ML IJ SOLN: 0.0000 [IU] | Freq: Every day | INTRAMUSCULAR | Status: DC

## 2021-10-01 MED ORDER — ENOXAPARIN SODIUM 40 MG/0.4ML IJ SOSY
40.0000 mg | PREFILLED_SYRINGE | INTRAMUSCULAR | Status: DC
  Administered 2021-10-01 – 2021-10-02 (×2): 40 mg via SUBCUTANEOUS
  Filled 2021-10-01 (×2): qty 0.4

## 2021-10-01 MED ORDER — INSULIN ASPART 100 UNIT/ML IJ SOLN: 0.0000 [IU] | Freq: Three times a day (TID) | INTRAMUSCULAR | Status: DC

## 2021-10-01 MED ORDER — SODIUM CHLORIDE 0.9 % IV SOLN: INTRAVENOUS | Status: DC

## 2021-10-01 MED ORDER — ASPIRIN 81 MG PO CHEW
324.0000 mg | CHEWABLE_TABLET | Freq: Once | ORAL | Status: AC
  Administered 2021-10-01: 324 mg via ORAL
  Filled 2021-10-01: qty 4

## 2021-10-01 MED ORDER — IOHEXOL 350 MG/ML SOLN
100.0000 mL | Freq: Once | INTRAVENOUS | Status: AC | PRN
  Administered 2021-10-01: 100 mL via INTRAVENOUS

## 2021-10-01 NOTE — ED Notes (Signed)
Pt made aware urine specimen is needed.  

## 2021-10-01 NOTE — H&P (Signed)
TRH H&P   Patient Demographics:    Browning Southwood, is a 87 y.o. male  MRN: [id number]   DOB - 29-May-1934  Admit Date - 10/01/2021  Outpatient Primary MD for the patient is Patient, No Pcp Per (Inactive)  Referring MD/NP/PA: Dr Effie Shy  Patient coming from: Home  Chief Complaint  Patient presents with   Numbness      HPI:    Eisa Necaise  is a 87 y.o. male, with past medical history of hypertension, diabetes mellitus, patient presents with multiple complaints, including tingling, numbness, for prolonged periods of time, but he does report unsteady gait yesterday, as well has some impaired speech earlier today, wife at bedside assists with the history, patient reportedly had unsteady gait yesterday, this morning had trouble moving both of his hands, reports tingling and numbness in all extremities, but reports this has been chronic, as well this morning he has been less verbal, later in the day he is verbal but with very slow speech, prompted wife to bring him to ED, he denies any focal deficits, loss of consciousness or altered mental status. - in ED work-up significant for significant creatinine 0.76, white blood cell of 3.1, platelet at 136, MRI brain significant for left thalamic infarct, [facility] hospitalist consulted to admit.    Review of systems:    In addition to the HPI above, No Fever-chills, No Headache, No changes with Vision or hearing, No problems swallowing food or Liquids, No Chest pain, Cough or Shortness of Breath, No Abdominal pain, No Nausea or Vommitting, Bowel movements are regular, No Blood in stool or Urine, No dysuria, No new skin rashes or bruises, No new joints pains-aches,  Patient with unsteady gait, tingling and numbness in all extremities, and slurred speech earlier this morning No recent weight gain or loss, No polyuria, polydypsia or  polyphagia, No significant Mental Stressors.  A full 10 point Review of Systems was done, except as stated above, all other Review of Systems were negative.   With Past History of the following :    Past Medical History:  Diagnosis Date   Colon polyps 2011   Non cancerous    Diabetes mellitus    Diverticulosis    Elevated PSA 2010   dr. 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      Past Surgical History:  Procedure Laterality Date   CYSTOSTOMY W/ BLADDER BIOPSY     removal of benign mole     15 years ago      Social History:     Social History   Tobacco Use   Smoking status: Former    Types: Cigarettes   Smokeless tobacco: Never  Substance Use Topics   Alcohol use: No  Family History :     Family History  Problem Relation Age of Onset   Heart failure Mother    Pneumonia Father    Coronary artery disease Brother        bowel obstruction   Pneumonia Sister    Stroke Sister    Aneurysm Sister      Home Medications:   Prior to Admission medications   Medication Sig Start Date End Date Taking? Authorizing Provider  cetirizine (ZYRTEC) 10 MG tablet Take 10 mg by mouth daily as needed. 04/01/15  Yes [provider]  Cholecalciferol (VITAMIN D3) LIQD 1 drop by Does not apply route daily.   Yes [provider]  cloNIDine (CATAPRES) 0.1 MG tablet Take 0.1 mg by mouth daily.   Yes [provider]  metFORMIN (GLUCOPHAGE-XR) 500 MG 24 hr tablet Take 500 mg by mouth daily. 10/08/19  Yes [provider]  OVER THE COUNTER MEDICATION Take 1 tablet by mouth daily. Curemed   Yes [provider]  thiamine (VITAMIN B-1) 100 MG tablet Take 100 mg by mouth daily.   Yes [provider]  Turmeric (QC TUMERIC COMPLEX PO) Take 1 tablet by mouth daily.   Yes [provider]  cholecalciferol (VITAMIN D) 1000  units tablet Take 1,000 Units by mouth daily. Patient not taking: Reported on 10/01/2021    [provider]  cyanocobalamin 100 MCG tablet Take 1 tablet by mouth daily. Patient not taking: Reported on 10/01/2021 01/12/16   [provider]  lisinopril (PRINIVIL,ZESTRIL) 40 MG tablet Take 40 mg by mouth daily. Patient not taking: Reported on 10/01/2021    [provider]  losartan (COZAAR) 100 MG tablet Take 100 mg by mouth daily. Patient not taking: Reported on 10/01/2021    [provider]  meclizine (ANTIVERT) 25 MG tablet Take 1 tablet (25 mg total) by mouth 3 (three) times daily as needed for dizziness. Patient not taking: Reported on 10/01/2021 01/04/20   Devoria Albe, MD  metFORMIN (GLUCOPHAGE-XR) 500 MG 24 hr tablet Take 1 tablet by mouth daily. Patient not taking: Reported on 10/01/2021 10/10/14   [provider]  ondansetron (ZOFRAN) 4 MG tablet Take 1 tablet (4 mg total) by mouth every 8 (eight) hours as needed for nausea or vomiting. Patient not taking: Reported on 10/01/2021 01/04/20   Devoria Albe, MD  zinc gluconate 50 MG tablet Take 1 tablet by mouth daily. Patient not taking: Reported on 10/01/2021 10/11/13   [provider]     Allergies:     Allergies  Allergen Reactions   Lisinopril Swelling    Lip swelling   Amlodipine    Penicillins     Has patient had a PCN reaction causing immediate rash, facial/tongue/throat swelling, SOB or lightheadedness with hypotension: No Has patient had a PCN reaction causing severe rash involving mucus membranes or skin necrosis: No Has patient had a PCN reaction that required hospitalization No Has patient had a PCN reaction occurring within the last 10 years: No  "Locked my knee up" If all of the above answers are "NO", then may proceed with Cephalosporin use.      Physical Exam:   Vitals  Blood pressure (!) 185/96, pulse 63, temperature 98 F (36.7 C), temperature source Oral,  resp. rate 15, SpO2 96 %.   1. General alert male, laying in bed, no apparent distress  2. Normal affect and insight, Not Suicidal or Homicidal, Awake Alert, Oriented X 3.  3. No F.N deficits,  ALL C.Nerves Intact, patient with poor coordination in right hand, speech is slow, but appropriate .  4. Ears and Eyes appear Normal, Conjunctivae clear, PERRLA. Moist Oral Mucosa.  5. Supple Neck, No JVD, No cervical lymphadenopathy appriciated, No Carotid Bruits.  6. Symmetrical Chest wall movement, Good air movement bilaterally, CTAB.  7. RRR, No Gallops, Rubs or Murmurs, No Parasternal Heave.  8. Positive Bowel Sounds, Abdomen Soft, No tenderness, No organomegaly appriciated,No rebound -guarding or rigidity.  9.  No Cyanosis, Normal Skin Turgor, No Skin Rash or Bruise.  10. Good muscle tone,  joints appear normal , no effusions, Normal ROM.  11. No Palpable Lymph Nodes in Neck or Axillae    Data Review:    CBC Recent Labs  Lab 10/01/21 1239  WBC 3.1*  HGB 14.2  HCT 44.1  PLT 136*  MCV 91.9  MCH 29.6  MCHC 32.2  RDW 13.2  LYMPHSABS 1.1  MONOABS 0.3  EOSABS 0.0  BASOSABS 0.0   ------------------------------------------------------------------------------------------------------------------  Chemistries  Recent Labs  Lab 10/01/21 1239  NA 140  K 4.5  CL 106  CO2 29  GLUCOSE 104*  BUN 18  CREATININE 0.76  CALCIUM 10.0  AST 17  ALT 11  ALKPHOS 58  BILITOT 0.9   ------------------------------------------------------------------------------------------------------------------ CrCl cannot be calculated (Unknown ideal weight.). ------------------------------------------------------------------------------------------------------------------ No results for input(s): TSH, T4TOTAL, T3FREE, THYROIDAB in the last 72 hours.  Invalid input(s): FREET3  Coagulation profile Recent Labs  Lab 10/01/21 1239  INR 1.0    ------------------------------------------------------------------------------------------------------------------- No results for input(s): DDIMER in the last 72 hours. -------------------------------------------------------------------------------------------------------------------  Cardiac Enzymes No results for input(s): CKMB, TROPONINI, MYOGLOBIN in the last 168 hours.  Invalid input(s): CK ------------------------------------------------------------------------------------------------------------------ No results found for: BNP   ---------------------------------------------------------------------------------------------------------------  Urinalysis    Component Value Date/Time   COLORURINE STRAW (A) 10/01/2021 1451   APPEARANCEUR CLEAR 10/01/2021 1451   LABSPEC 1.006 10/01/2021 1451   PHURINE 7.0 10/01/2021 1451   GLUCOSEU NEGATIVE 10/01/2021 1451   HGBUR NEGATIVE 10/01/2021 1451   BILIRUBINUR NEGATIVE 10/01/2021 1451   KETONESUR NEGATIVE 10/01/2021 1451   PROTEINUR NEGATIVE 10/01/2021 1451   NITRITE NEGATIVE 10/01/2021 1451   LEUKOCYTESUR NEGATIVE 10/01/2021 1451    ----------------------------------------------------------------------------------------------------------------   Imaging Results:    CT HEAD WO CONTRAST  Result Date: 10/01/2021 CLINICAL DATA:  Mental status change, unknown cause. Additional history provided: Numbness in extremities, history of diabetes mellitus, hypertension. EXAM: CT HEAD WITHOUT CONTRAST TECHNIQUE: Contiguous axial images were obtained from the base of the skull through the vertex without intravenous contrast. COMPARISON:  Noncontrast head CT and CT perfusion head 01/04/2020. FINDINGS: Brain: Mild generalized cerebral atrophy. Chronic small-vessel infarcts within the right corona radiata/basal ganglia, progressed in number as compared to the prior head CT of 01/04/2020. Background mild patchy and ill-defined hypoattenuation within  the cerebral white matter, nonspecific but compatible chronic small vessel ischemic disease. Redemonstrated chronic lacunar infarct within the medial right cerebellar hemisphere (series 2, image 11). There is no acute intracranial hemorrhage. No demarcated cortical infarct. No extra-axial fluid collection. No evidence of an intracranial mass. No midline shift. Vascular: No hyperdense vessel.  Atherosclerotic calcifications. Skull: Normal. Negative for fracture or focal lesion. Sinuses/Orbits: Visualized orbits show no acute finding. Trace mucosal thickening within the bilateral ethmoid air cells. Small-volume fluid within a posterior left ethmoid air cell. IMPRESSION: No evidence of acute intracranial abnormality. Chronic small-vessel infarcts within the right corona radiata/basal ganglia, progressed in number as compared to the head CT of 01/04/2020. Background mild chronic small  vessel ischemic changes within the cerebral white matter. Redemonstrated chronic lacunar infarct within the right cerebellar hemisphere. Mild generalized cerebral atrophy. Mild paranasal sinus disease, as described. Electronically Signed   By: Jackey Loge D.O.   On: 10/01/2021 13:24   MR ANGIO HEAD WO CONTRAST  Result Date: 10/01/2021 CLINICAL DATA:  Neuro deficit, acute, stroke suspected EXAM: MRI HEAD WITHOUT CONTRAST MRA HEAD WITHOUT CONTRAST TECHNIQUE: Multiplanar, multi-echo pulse sequences of the brain and surrounding structures were acquired without intravenous contrast. Angiographic images of the Circle of Willis were acquired using MRA technique without intravenous contrast. COMPARISON:  No pertinent prior exam. FINDINGS: MRI HEAD Brain: There is a 8 mm focus of reduced diffusion within the left thalamus. Chronic infarcts of the right lentiform nucleus and corona radiata with some associated chronic blood products. Small chronic right precentral gyrus cortical infarct. Small chronic right cerebellar infarct. Additional  patchy T2 hyperintensity in the supratentorial white matter is nonspecific but may reflect mild chronic microvascular ischemic changes. Prominence of the ventricles and sulci reflects mild parenchymal volume loss. Vascular: Major vessel flow voids at the skull base are preserved. Skull and upper cervical spine: Normal marrow signal is preserved. Sinuses/Orbits: Paranasal sinuses are aerated. Orbits are unremarkable. Other: Sella is unremarkable.  Mastoid air cells are clear. MRA HEAD Intracranial internal carotid arteries are patent. Middle and anterior cerebral arteries are patent. Mild atherosclerotic irregularity distal MCA branches. Focal high-grade stenosis distal right A2 ACA. Intracranial vertebral arteries, basilar artery, posterior cerebral arteries are patent. Bilateral posterior communicating arteries are present with fetal origin of the posterior cerebral arteries. Focal high-grade stenosis left P2 PCA. No aneurysm. IMPRESSION: Acute left thalamic infarct. Chronic infarcts and chronic microvascular ischemic changes. No large vessel occlusion. Focal high-grade stenoses distal right A2 ACA and left P2 PCA. Electronically Signed   By: Guadlupe Spanish M.D.   On: 10/01/2021 16:19   MR BRAIN WO CONTRAST  Result Date: 10/01/2021 CLINICAL DATA:  Neuro deficit, acute, stroke suspected EXAM: MRI HEAD WITHOUT CONTRAST MRA HEAD WITHOUT CONTRAST TECHNIQUE: Multiplanar, multi-echo pulse sequences of the brain and surrounding structures were acquired without intravenous contrast. Angiographic images of the Circle of Willis were acquired using MRA technique without intravenous contrast. COMPARISON:  No pertinent prior exam. FINDINGS: MRI HEAD Brain: There is a 8 mm focus of reduced diffusion within the left thalamus. Chronic infarcts of the right lentiform nucleus and corona radiata with some associated chronic blood products. Small chronic right precentral gyrus cortical infarct. Small chronic right cerebellar  infarct. Additional patchy T2 hyperintensity in the supratentorial white matter is nonspecific but may reflect mild chronic microvascular ischemic changes. Prominence of the ventricles and sulci reflects mild parenchymal volume loss. Vascular: Major vessel flow voids at the skull base are preserved. Skull and upper cervical spine: Normal marrow signal is preserved. Sinuses/Orbits: Paranasal sinuses are aerated. Orbits are unremarkable. Other: Sella is unremarkable.  Mastoid air cells are clear. MRA HEAD Intracranial internal carotid arteries are patent. Middle and anterior cerebral arteries are patent. Mild atherosclerotic irregularity distal MCA branches. Focal high-grade stenosis distal right A2 ACA. Intracranial vertebral arteries, basilar artery, posterior cerebral arteries are patent. Bilateral posterior communicating arteries are present with fetal origin of the posterior cerebral arteries. Focal high-grade stenosis left P2 PCA. No aneurysm. IMPRESSION: Acute left thalamic infarct. Chronic infarcts and chronic microvascular ischemic changes. No large vessel occlusion. Focal high-grade stenoses distal right A2 ACA and left P2 PCA. Electronically Signed   By: Guadlupe Spanish M.D.   On:  10/01/2021 16:19    My personal review of EKG:  Vent. rate 62 BPM PR interval 202 ms QRS duration 95 ms QT/QTcB 427/434 ms P-R-T axes 62 72 48 Sinus rhythm  Assessment & Plan:    Active Problems:   Essential hypertension   DM (diabetes mellitus) (HCC)   Dyslipidemia   Stroke (cerebrum) (HCC)   Acute CVA -MRI brain significant for acute left thalamic infarct, this is most likely in the setting of small vessel disease, patient will be monitored on telemetry, will obtain 2D echo, will obtain CTA head and neck, A1c, lipid panel, will allow for permissive hypertension, consult PT/OT/SLP. -Patient received full dose aspirin in ED, will await further recommendation from neurology.  Hypertension -Hold home  medication and allow for permissive hypertension  Diabetes mellitus -We will hold metformin, check A1c, and will keep on insulin sliding scale  Dyslipidemia -We will check lipid panel, if elevated we will be started on high intensity statin  DVT Prophylaxis  Lovenox  AM Labs Ordered, also please review Full Orders  Family Communication: Admission, patients condition and plan of care including tests being ordered have been discussed with the patient wife at bedsidewho indicate understanding and agree with the plan and Code Status.  Code Status Full  Likely DC to  Home  Condition GUARDED    Consults called: Tele neuro by ED    Admission status: inpatient    Time spent in minutes : 55 minutes   Huey Bienenstock M.D on 10/01/2021 at 5:37 PM   [facility] - Office  [phone]

## 2021-10-01 NOTE — ED Provider Notes (Signed)
Note in error    Cheryll Cockayne, MD 10/06/21 0730

## 2021-10-01 NOTE — ED Notes (Signed)
Meal provided to the patient  

## 2021-10-01 NOTE — ED Provider Notes (Signed)
[facility] EMERGENCY DEPARTMENT Provider Note   CSN: [id number] Arrival date & time: 10/01/21  1203     History Chief Complaint  Patient presents with   Numbness    Dustin Chandler is a 87 y.o. male.  HPI Male with history of neuropathy, presenting for change in status today.  History from wife at bedside states that earlier this morning he would not talk then had trouble using both hands.  The hand movement has not improved.  Later he began to talk but was speaking "very slowly."  He was able to ambulate and get into the vehicle to come here.  He was assisted by family members.  He went to the [place] clinic, driving himself, 2 days ago to get treatment for "neuropathy."  He neuropathy has been going on for a long time, and has been worse for the last couple of days.  Today his symptoms are bilateral.  There has been no known trauma.  He has been otherwise well, and able to do his usual activities until this morning.  There has been no cough, fever or vomiting.  Level 5 caveat-altered mental status    Past Medical History:  Diagnosis Date   Colon polyps 2011   Non cancerous    Diabetes mellitus    Diverticulosis    Elevated PSA 2010   dr. 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   Dyslipidemia 11/07/2012   Routine general medical examination at a health care facility 07/01/2012   DM (diabetes mellitus) (HCC) 01/01/2012   ALLERGIC RHINITIS 02/11/2010   PROSTATE SPECIFIC ANTIGEN, ELEVATED 09/20/2009   Overweight (BMI 25.0-29.9) 07/18/2009   FATIGUE 07/17/2009   HYPERTENSION 07/18/2008    Past Surgical History:  Procedure Laterality Date   CYSTOSTOMY W/ BLADDER BIOPSY     removal of benign mole     15 years ago       Family History  Problem Relation Age of Onset   Heart  failure Mother    Pneumonia Father    Coronary artery disease Brother        bowel obstruction   Pneumonia Sister    Stroke Sister    Aneurysm Sister     Social History   Tobacco Use   Smoking status: Former    Types: Cigarettes   Smokeless tobacco: Never  Substance Use Topics   Alcohol use: No   Drug use: No    Home Medications Prior to Admission medications   Medication Sig Start Date End Date Taking? Authorizing Provider  cetirizine (ZYRTEC) 10 MG tablet TAKE ONE TABLET BY MOUTH EVERY 8 WEEKS AS NEEDED 04/01/15  Yes [provider]  cyanocobalamin 100 MCG tablet Take 1 tablet by mouth daily. 01/12/16  Yes [provider]  metFORMIN (GLUCOPHAGE-XR) 500 MG 24 hr tablet Take 1 tablet by mouth daily. 10/10/14  Yes [provider]  zinc gluconate 50 MG tablet Take 1 tablet by mouth daily. 10/11/13  Yes [provider]  cholecalciferol (VITAMIN D) 1000 units tablet Take 1,000 Units by mouth daily.    [provider]  cloNIDine (CATAPRES) 0.1 MG tablet Take 0.1 mg by mouth 2 (two) times daily.    [provider]  lisinopril (PRINIVIL,ZESTRIL) 40 MG tablet Take 40 mg  by mouth daily.    [provider]  losartan (COZAAR) 100 MG tablet Take 100 mg by mouth daily.    [provider]  meclizine (ANTIVERT) 25 MG tablet Take 1 tablet (25 mg total) by mouth 3 (three) times daily as needed for dizziness. 01/04/20   Devoria Albe, MD  metFORMIN (GLUCOPHAGE-XR) 500 MG 24 hr tablet Take 500 mg by mouth daily. 10/08/19   [provider]  ondansetron (ZOFRAN) 4 MG tablet Take 1 tablet (4 mg total) by mouth every 8 (eight) hours as needed for nausea or vomiting. 01/04/20   Devoria Albe, MD  thiamine (VITAMIN B-1) 100 MG tablet Take 100 mg by mouth daily.    [provider]    Allergies    Lisinopril, Amlodipine, and Penicillins  Review of Systems   Review of Systems  Unable to perform ROS: Mental status change    Physical Exam Updated Vital Signs BP (!) 185/96   Pulse 63   Temp 98 F (36.7 C) (Oral)   Resp 15   SpO2 96%   Physical Exam Vitals and nursing note reviewed.  Constitutional:      General: He is not in acute distress.    Appearance: He is well-developed. He is not ill-appearing or diaphoretic.  HENT:     Head: Normocephalic and atraumatic.     Right Ear: External ear normal.     Left Ear: External ear normal.  Eyes:     Conjunctiva/sclera: Conjunctivae normal.     Pupils: Pupils are equal, round, and reactive to light.  Neck:     Trachea: Phonation normal.  Cardiovascular:     Rate and Rhythm: Normal rate and regular rhythm.     Heart sounds: Normal heart sounds.  Pulmonary:     Effort: Pulmonary effort is normal.     Breath sounds: Normal breath sounds.  Abdominal:     Palpations: Abdomen is soft.     Tenderness: There is no abdominal tenderness.  Musculoskeletal:        General: Normal range of motion.     Cervical back: Normal range of motion and neck supple.  Skin:    General: Skin is warm and dry.  Neurological:     Mental Status: He is alert.     Cranial Nerves: No cranial nerve deficit.     Sensory: No sensory deficit.     Motor: No abnormal muscle tone.     Coordination: Coordination normal.     Comments: Alert.  Follows commands accurately.  No dysarthria.  He speaks slowly.  He is mildly confused.  He is able to independently move both arms and legs, bilaterally.  No asymmetry of strength.  Psychiatric:        Mood and Affect: Mood normal.        Behavior: Behavior normal.    ED Results / Procedures / Treatments   Labs (all labs ordered are listed, but only abnormal results are displayed) Labs Reviewed  CBC - Abnormal; Notable for the following components:      Result Value   WBC 3.1 (*)    Platelets 136 (*)    All other components within normal limits  DIFFERENTIAL - Abnormal; Notable for the following components:   Neutro Abs 1.6 (*)    All  other components within normal limits  COMPREHENSIVE METABOLIC PANEL - Abnormal; Notable for the following components:   Glucose, Bld 104 (*)    All other components within normal limits  URINALYSIS, ROUTINE W REFLEX MICROSCOPIC - Abnormal; Notable for the following components:   Color, Urine STRAW (*)    All other components within normal limits  RESP PANEL BY RT-PCR (FLU A&B, COVID) ARPGX2  ETHANOL  PROTIME-INR  APTT  RAPID URINE DRUG SCREEN, HOSP PERFORMED    EKG EKG Interpretation  Date/Time:  Friday October 01 2021 13:08:30 EDT Ventricular Rate:  62 PR Interval:  202 QRS Duration: 95 QT Interval:  427 QTC Calculation: 434 R Axis:   72 Text Interpretation: Sinus rhythm Confirmed by Mancel Bale [phone]) on 10/01/2021 2:53:03 PM  Radiology CT HEAD WO CONTRAST  Result Date: 10/01/2021 CLINICAL DATA:  Mental status change, unknown cause. Additional history provided: Numbness in extremities, history of diabetes mellitus, hypertension. EXAM: CT HEAD WITHOUT CONTRAST TECHNIQUE: Contiguous axial images were obtained from the base of the skull through the vertex without intravenous contrast. COMPARISON:  Noncontrast head CT and CT perfusion head 01/04/2020. FINDINGS: Brain: Mild generalized cerebral atrophy. Chronic small-vessel infarcts within the right corona radiata/basal ganglia, progressed in number as compared to the prior head CT of 01/04/2020. Background mild patchy and ill-defined hypoattenuation within the cerebral white matter, nonspecific but compatible chronic small vessel ischemic disease. Redemonstrated chronic lacunar infarct within the medial right cerebellar hemisphere (series 2, image 11). There is no acute intracranial hemorrhage. No demarcated cortical infarct. No extra-axial fluid collection. No evidence of an intracranial mass. No midline shift. Vascular: No hyperdense vessel.  Atherosclerotic calcifications. Skull: Normal. Negative for fracture or focal lesion.  Sinuses/Orbits: Visualized orbits show no acute finding. Trace mucosal thickening within the bilateral ethmoid air cells. Small-volume fluid within a posterior left ethmoid air cell. IMPRESSION: No evidence of acute intracranial abnormality. Chronic small-vessel infarcts within the right corona radiata/basal ganglia, progressed in number as compared to the head CT of 01/04/2020. Background mild chronic small vessel ischemic changes within the cerebral white matter. Redemonstrated chronic lacunar infarct within the right cerebellar hemisphere. Mild generalized cerebral atrophy. Mild paranasal sinus disease, as described. Electronically Signed   By: Jackey Loge D.O.   On: 10/01/2021 13:24   MR ANGIO HEAD WO CONTRAST  Result Date: 10/01/2021 CLINICAL DATA:  Neuro deficit, acute, stroke suspected EXAM: MRI HEAD WITHOUT CONTRAST MRA HEAD WITHOUT CONTRAST TECHNIQUE: Multiplanar, multi-echo pulse sequences of the brain and surrounding structures were acquired without intravenous contrast. Angiographic images of the Circle of Willis were acquired using MRA technique without intravenous contrast. COMPARISON:  No pertinent prior exam. FINDINGS: MRI HEAD Brain: There is a 8 mm focus of reduced diffusion within the left thalamus. Chronic infarcts of the right lentiform nucleus and corona radiata with some associated chronic blood products. Small chronic right precentral gyrus cortical infarct. Small chronic right cerebellar infarct. Additional patchy T2 hyperintensity in the supratentorial white matter is nonspecific but may reflect mild chronic microvascular ischemic changes. Prominence of the ventricles and sulci reflects mild parenchymal volume loss. Vascular: Major vessel flow voids at the skull base are preserved. Skull and upper cervical spine: Normal marrow signal is preserved. Sinuses/Orbits: Paranasal sinuses are aerated. Orbits are unremarkable. Other: Sella is unremarkable.  Mastoid air cells are clear. MRA  HEAD Intracranial internal carotid arteries are patent. Middle and anterior cerebral arteries are patent. Mild atherosclerotic irregularity distal MCA branches. Focal high-grade stenosis distal right A2 ACA. Intracranial vertebral arteries, basilar artery, posterior cerebral arteries are patent. Bilateral posterior communicating arteries are present with fetal origin of the posterior cerebral arteries. Focal high-grade stenosis left P2 PCA. No aneurysm. IMPRESSION: Acute  left thalamic infarct. Chronic infarcts and chronic microvascular ischemic changes. No large vessel occlusion. Focal high-grade stenoses distal right A2 ACA and left P2 PCA. Electronically Signed   By: Guadlupe Spanish M.D.   On: 10/01/2021 16:19   MR BRAIN WO CONTRAST  Result Date: 10/01/2021 CLINICAL DATA:  Neuro deficit, acute, stroke suspected EXAM: MRI HEAD WITHOUT CONTRAST MRA HEAD WITHOUT CONTRAST TECHNIQUE: Multiplanar, multi-echo pulse sequences of the brain and surrounding structures were acquired without intravenous contrast. Angiographic images of the Circle of Willis were acquired using MRA technique without intravenous contrast. COMPARISON:  No pertinent prior exam. FINDINGS: MRI HEAD Brain: There is a 8 mm focus of reduced diffusion within the left thalamus. Chronic infarcts of the right lentiform nucleus and corona radiata with some associated chronic blood products. Small chronic right precentral gyrus cortical infarct. Small chronic right cerebellar infarct. Additional patchy T2 hyperintensity in the supratentorial white matter is nonspecific but may reflect mild chronic microvascular ischemic changes. Prominence of the ventricles and sulci reflects mild parenchymal volume loss. Vascular: Major vessel flow voids at the skull base are preserved. Skull and upper cervical spine: Normal marrow signal is preserved. Sinuses/Orbits: Paranasal sinuses are aerated. Orbits are unremarkable. Other: Sella is unremarkable.  Mastoid air cells  are clear. MRA HEAD Intracranial internal carotid arteries are patent. Middle and anterior cerebral arteries are patent. Mild atherosclerotic irregularity distal MCA branches. Focal high-grade stenosis distal right A2 ACA. Intracranial vertebral arteries, basilar artery, posterior cerebral arteries are patent. Bilateral posterior communicating arteries are present with fetal origin of the posterior cerebral arteries. Focal high-grade stenosis left P2 PCA. No aneurysm. IMPRESSION: Acute left thalamic infarct. Chronic infarcts and chronic microvascular ischemic changes. No large vessel occlusion. Focal high-grade stenoses distal right A2 ACA and left P2 PCA. Electronically Signed   By: Guadlupe Spanish M.D.   On: 10/01/2021 16:19    Procedures .Critical Care Performed by: Mancel Bale, MD Authorized by: Mancel Bale, MD   Critical care provider statement:    Critical care time (minutes):  50   Critical care start time:  10/01/2021 12:30 PM   Critical care end time:  10/01/2021 5:21 PM   Critical care time was exclusive of:  Separately billable procedures and treating other patients   Critical care was time spent personally by me on the following activities:  Blood draw for specimens, development of treatment plan with patient or surrogate, discussions with consultants, evaluation of patient's response to treatment, examination of patient, ordering and performing treatments and interventions, ordering and review of laboratory studies, ordering and review of radiographic studies, pulse oximetry, re-evaluation of patient's condition and review of old charts   Medications Ordered in ED Medications - No data to display  ED Course  I have reviewed the triage vital signs and the nursing notes.  Pertinent labs & imaging results that were available during my care of the patient were reviewed by me and considered in my medical decision making (see chart for details).    MDM Rules/Calculators/A&P                             Patient Vitals for the past 24 hrs:  BP Temp Temp src Pulse Resp SpO2  10/01/21 1500 (!) 185/96 -- -- 63 15 96 %  10/01/21 1430 (!) 180/98 -- -- 62 15 99 %  10/01/21 1400 (!) 165/98 -- -- 65 15 100 %  10/01/21 1330 (!) 184/103 -- -- 61  14 100 %  10/01/21 1300 (!) 172/110 -- -- 64 14 100 %  10/01/21 1230 (!) 187/121 -- -- 67 17 100 %  10/01/21 1211 (!) 195/109 98 F (36.7 C) Oral 65 17 100 %    5:21 PM Reevaluation with update and discussion. After initial assessment and treatment, an updated evaluation reveals no change in clinical status.  Findings discussed and questions answered. Mancel Bale   Medical Decision Making:  This patient is presenting for evaluation of nonspecific symptoms with confusion and weakness, which does require a range of treatment options, and is a complaint that involves a high risk of morbidity and mortality. The differential diagnoses include CVA, metabolic disorder, hemodynamic instability, medication complication. I decided to review old records, and in summary elderly male with history of neuropathy, lives at home and gets most of his care at the [facility].  He is presenting with nonspecific symptoms.  His wife is the primary historian.  He has a history of hypertension, fatigue, diabetes. I received additional historical information from his wife at the bedside.  Clinical Laboratory Tests Ordered, included CBC, Metabolic panel, Urinalysis, and alcohol level, PT/INR, viral panel, urine drug screen . Review indicates essentially normal findings. Radiologic Tests Ordered, included CT head.  I independently Visualized: No acute abnormality images, which show progressive CNS disease with lacunar infarcts on initial CT imaging.  MRI brain ordered to clarify acute/subacute/chronic  Cardiac Monitor Tracing which shows normal sinus rhythm    Critical Interventions-clinical evaluation, laboratory testing, CT imaging, IV fluids, advanced  imaging/MRI, MRA, observation and reassessment  After These Interventions, the Patient was reevaluated and was found with acute left thalamic stroke.  Patient with multiple lacunar infarcts, now with new left thalamic stroke.  He did not meet criteria for code stroke.  He requires admission for evaluation and treatment.  He will need neurology consultation.  CRITICAL CARE-yes Performed by: Mancel Bale  Nursing Notes Reviewed/ Care Coordinated Applicable Imaging Reviewed Interpretation of Laboratory Data incorporated into ED treatment  5:05 PM-Case discussed with hospitalist arrange admission  5:15 PM-Case discussed with telemetry specialist neurology service to arrange for consultation by neurologist regarding acute CVA.    Final Clinical Impression(s) / ED Diagnoses Final diagnoses:  Cerebrovascular accident (CVA), unspecified mechanism (HCC)    Rx / DC Orders ED Discharge Orders     None        Mancel Bale, MD 10/01/21 1721

## 2021-10-01 NOTE — Consult Note (Addendum)
[facility] Tele[facility]urology Consult Services  Stat Consult  Patient Name:   Dustin Chandler, Dustin Chandler Date of Birth:   Nov 26, 1934 Identification Number:   MRN - [id number] Date of Service:   10/01/2021 17:08:34  Diagnosis:       I63.9 - Cerebrovascular accident (CVA), unspecified mechanism (HCC)  Impression Patient is 87 yo LH male with a PMH of HTN, right ulnar neuropathy, DM II with peripheral neuropathy, PTSD who presented to the ED with right hand numbness and weakness, with associated slowed speech. LNK is last night, unclear time. On exam his NIHSS is 3, for subtle right NLF flattening, right UE ataxia and sensory loss. His SBP has been elevated to the 180s-200s in the ED. His initial CTH showed no acute findings. He underwent and MRI brain and MRA brain, which showed an 8 mm focus of reduced diffusion within the left thalamus (with T2 flair correlate on my review). This acute stroke is c/w his current symptoms. In addition, multiple chronic strokes- right lentiform nucleus and corona radiata with some associated chronic blood products, and ischemis small chronic right precentral gyrus cortical infarct, small chronic right cerebellar infarcts. MRA head was negative for LVO, but significant for severe distal right A2 and left P2 stenosis, no corresponding acute perfusion changes on MRI brain. Emergent NIR not indicated. No prior vascular images for comparison. hrombolytic therapy is not recommended with LNK >4.5 hours and no significant DWI/Flair mismatch on MRI.    Suspected small vessel mediated acute left thalamic stroke, cannot exclude embolic source. TCT HEAD: Reviewed  Our recommendations are outlined below.  Diagnostic Studies: Routine CTA head and neck visualization of vasculature Transthoracic Echo with bubble study, if available  Laboratory Studies: Recommend Lipid panel Hemoglobin A1c  Medications: Initiate Aspirin 81 mg daily Permissive hypertension,  Antihypertensives with prn for first 24-48 hrs post stroke onset. If BP greater than 220/120 give Labetalol IV or Vasotec IV  Nursing Recommendations: Telemetry, IV Fluids, avoid dextrose containing fluids, Maintain euglycemia Neuro checks q4 hrs x 24 hrs and then per shift Head of bed 30 degrees  Consultations: Recommend Speech therapy if failed dysphagia screen Physical therapy/Occupational therapy  DVT Prophylaxis: Choice of Primary Team  Disposition: Neurology will follow  Diagnostic Studies Free Text: CTA neck would also obtain repeat CTA head for additional visualization of intracranial stenosis noted on MRA, vs CUS  Free Text Medications: Can consider short term DAPT for 21 days, with ASA 81mg  and Plavix 75mg  daily based on inpatient work-up. He was given ASA 325mg  in the ED. Not on antiplatelets prior to arrival   [employer]: [facility] Notification Time: 10/01/2021 17:06:32 Stamp Time: 10/01/2021 17:08:34 Callback Response Time: 10/01/2021 17:14:01   ----------------------------------------------------------------------------------------------------  Chief Complaint: stroke  History of Present Illness: Patient is a 87 year old Male.  Patient is 87 yo LH male with a PMH of HTN, right ulnar neuropathy, DM II with peripheral neuropathy, PTSD who presented to the ED with right hand numbness and weakness, with associated slowed speech. LNK is last night, unclear time. Patient reports that he noticed last night the finger and top of his right hand were numb. When he awoke this morning this persisted. However, later in the morning he noticed he was havign trouble moving and using his right hand. His wife noticed his speech was slowed and slightly slurred. His speech and right arm strength have since improved. His right hand still feels numb, with mild clumsiness.       Examination: BP(185//96), Pulse(63), Blood Glucose(104) 1A:  Level of Consciousness - Alert;  keenly responsive + 0 1B: Ask Month and Age - Both Questions Right + 0 1C: Blink Eyes & Squeeze Hands - Performs Both Tasks + 0 2: Test Horizontal Extraocular Movements - Normal + 0 3: Test Visual Fields - No Visual Loss + 0 4: Test Facial Palsy (Use Grimace if Obtunded) - Minor paralysis (flat nasolabial fold, smile asymmetry) + 1 5A: Test Left Arm Motor Drift - No Drift for 10 Seconds + 0 5B: Test Right Arm Motor Drift - No Drift for 10 Seconds + 0 6A: Test Left Leg Motor Drift - No Drift for 5 Seconds + 0 6B: Test Right Leg Motor Drift - No Drift for 5 Seconds + 0 7: Test Limb Ataxia (FNF/Heel-Shin) - Ataxia in 1 Limb + 1 8: Test Sensation - Mild-Moderate Loss: Less Sharp/More Dull + 1 9: Test Language/Aphasia - Normal; No aphasia + 0 10: Test Dysarthria - Normal + 0 11: Test Extinction/Inattention - No abnormality + 0  NIHSS Score: 3     Patient / Family was informed the Neurology Consult would occur via TeleHealth consult by way of interactive audio and video telecommunications and consented to receiving care in this manner.  Patient is being evaluated for possible acute neurologic impairment and high probability of imminent or life - threatening deterioration.I spent total of 25 minutes providing care to this patient, including time for face to face visit via telemedicine, review of medical records, imaging studies and discussion of findings with providers, the patient and / or family.   Dr Jennefer Bravo   [facility] [phone]  Case [id number]

## 2021-10-01 NOTE — ED Triage Notes (Signed)
Numbness in extremities for quite a while, states it is worse in hands onset last night

## 2021-10-02 ENCOUNTER — Encounter (HOSPITAL_COMMUNITY): Payer: Self-pay | Admitting: Internal Medicine

## 2021-10-02 ENCOUNTER — Inpatient Hospital Stay (HOSPITAL_COMMUNITY): Payer: No Typology Code available for payment source

## 2021-10-02 DIAGNOSIS — I639 Cerebral infarction, unspecified: Principal | ICD-10-CM

## 2021-10-02 DIAGNOSIS — I6389 Other cerebral infarction: Secondary | ICD-10-CM

## 2021-10-02 DIAGNOSIS — E785 Hyperlipidemia, unspecified: Secondary | ICD-10-CM

## 2021-10-02 LAB — COMPREHENSIVE METABOLIC PANEL
ALT: 11 U/L (ref 0–44)
AST: 14 U/L — ABNORMAL LOW (ref 15–41)
Albumin: 3.6 g/dL (ref 3.5–5.0)
Alkaline Phosphatase: 50 U/L (ref 38–126)
Anion gap: 6 (ref 5–15)
BUN: 20 mg/dL (ref 8–23)
CO2: 26 mmol/L (ref 22–32)
Calcium: 10.1 mg/dL (ref 8.9–10.3)
Chloride: 108 mmol/L (ref 98–111)
Creatinine, Ser: 0.73 mg/dL (ref 0.61–1.24)
GFR, Estimated: >60 mL/min (ref >60)
Glucose, Bld: 103 mg/dL — ABNORMAL HIGH (ref 70–99)
Potassium: 3.8 mmol/L (ref 3.5–5.1)
Sodium: 140 mmol/L (ref 135–145)
Total Bilirubin: 0.7 mg/dL (ref 0.3–1.2)
Total Protein: 6.6 g/dL (ref 6.5–8.1)

## 2021-10-02 LAB — ECHOCARDIOGRAM COMPLETE
AR max vel: 2.07 cm2
AV Area VTI: 2.47 cm2
AV Area mean vel: 1.77 cm2
AV Mean grad: 2 mmHg
AV Peak grad: 4.3 mmHg
Ao pk vel: 1.04 m/s
Area-P 1/2: 2.5 cm2
Calc EF: 53.4 %
Height: 73 in
MV VTI: 2.17 cm2
S' Lateral: 3.05 cm
Single Plane A2C EF: 50.1 %
Single Plane A4C EF: 55.5 %
Weight: 3280.44 oz

## 2021-10-02 LAB — LIPID PANEL
Cholesterol: 142 mg/dL (ref 0–200)
HDL: 43 mg/dL (ref >40)
LDL Cholesterol: 91 mg/dL (ref 0–99)
Total CHOL/HDL Ratio: 3.3 RATIO
Triglycerides: 42 mg/dL (ref <150)
VLDL: 8 mg/dL (ref 0–40)

## 2021-10-02 LAB — HEMOGLOBIN A1C
Hgb A1c MFr Bld: 6 % — ABNORMAL HIGH (ref 4.8–5.6)
Mean Plasma Glucose: 125.5 mg/dL

## 2021-10-02 LAB — CBC
HCT: 40.4 % (ref 39.0–52.0)
Hemoglobin: 12.9 g/dL — ABNORMAL LOW (ref 13.0–17.0)
MCH: 29.3 pg (ref 26.0–34.0)
MCHC: 31.9 g/dL (ref 30.0–36.0)
MCV: 91.8 fL (ref 80.0–100.0)
Platelets: 135 10*3/uL — ABNORMAL LOW (ref 150–400)
RBC: 4.4 MIL/uL (ref 4.22–5.81)
RDW: 13.2 % (ref 11.5–15.5)
WBC: 3.8 10*3/uL — ABNORMAL LOW (ref 4.0–10.5)
nRBC: 0 % (ref 0.0–0.2)

## 2021-10-02 LAB — GLUCOSE, CAPILLARY
Glucose-Capillary: 83 mg/dL (ref 70–99)
Glucose-Capillary: 122 mg/dL — ABNORMAL HIGH (ref 70–99)

## 2021-10-02 LAB — CBG MONITORING, ED
Glucose-Capillary: 88 mg/dL (ref 70–99)
Glucose-Capillary: 96 mg/dL (ref 70–99)

## 2021-10-02 MED ORDER — LABETALOL HCL 5 MG/ML IV SOLN: 10.0000 mg | INTRAVENOUS | Status: DC | PRN

## 2021-10-02 MED ORDER — INFLUENZA VAC A&B SA ADJ QUAD 0.5 ML IM PRSY
0.5000 mL | PREFILLED_SYRINGE | INTRAMUSCULAR | Status: AC
  Administered 2021-10-03: 0.5 mL via INTRAMUSCULAR
  Filled 2021-10-02: qty 0.5

## 2021-10-02 MED ORDER — ATORVASTATIN CALCIUM 40 MG PO TABS
40.0000 mg | ORAL_TABLET | Freq: Every evening | ORAL | Status: DC
  Administered 2021-10-02: 40 mg via ORAL
  Filled 2021-10-02: qty 1

## 2021-10-02 NOTE — Evaluation (Signed)
Physical Therapy Evaluation Patient Details Name: Dustin Chandler MRN: [id number] DOB: [birth year] Today's Date: 10/02/2021  History of Present Illness  87 y.o. male presents with c/o numbness, tingling and unsteady gait.  MRI brain significant for left thalamic infarct, past medical history significant for hypertension, diabetes mellitus.  Clinical Impression  Patient presents with deficits in balance, affecting independence in transfers and gait.  Patient will benefit from further PT to address balance and increased independence with gait and transfers.  To return home, patient will need supervision to ambulate with RW, F/U HHPT to progress mobility and likely, assistance with ADL's due to balance issues.         Recommendations for follow up therapy are one component of a multi-disciplinary discharge planning process, led by the attending physician.  Recommendations may be updated based on patient status, additional functional criteria and insurance authorization.  Follow Up Recommendations Home health PT;Supervision for mobility/OOB    Equipment Recommendations  None recommended by PT    Recommendations for Other Services       Precautions / Restrictions Precautions Precautions: Fall Restrictions Weight Bearing Restrictions: No      Mobility  Bed Mobility Overal bed mobility: Modified Independent             General bed mobility comments: used railing ot come to edge of bed    Transfers Overall transfer level: Needs assistance Equipment used: Rolling walker (2 wheeled) Transfers: Sit to/from Stand Sit to Stand: Min guard            Ambulation/Gait Ambulation/Gait assistance: Min Chemical engineer (Feet): 60 Feet Assistive device: None Gait Pattern/deviations: Step-through pattern;Staggering left;Staggering right;Narrow base of support Gait velocity: decreased   General Gait Details: also ambulated with RW, 94' with min-guard assistance  Stairs             Wheelchair Mobility    Modified Rankin (Stroke Patients Only)       Balance Overall balance assessment: Needs assistance Sitting-balance support: No upper extremity supported;Feet supported Sitting balance-Leahy Scale: Good     Standing balance support: No upper extremity supported Standing balance-Leahy Scale: Fair Standing balance comment: in standing, holding onto hall rail, able to lift LE and fix sock.                             Pertinent Vitals/Pain Pain Assessment: No/denies pain    Home Living Family/patient expects to be discharged to:: Private residence Living Arrangements: Spouse/significant other;Children Available Help at Discharge: Family;Available 24 hours/day Type of Home: House Home Access: Stairs to enter     Home Layout: Multi-level Home Equipment: Dan Humphreys - 2 wheels      Prior Function Level of Independence: Independent               Hand Dominance        Extremity/Trunk Assessment   Upper Extremity Assessment Upper Extremity Assessment: Defer to OT evaluation    Lower Extremity Assessment Lower Extremity Assessment: Overall WFL for tasks assessed    Cervical / Trunk Assessment Cervical / Trunk Assessment: Kyphotic  Communication   Communication: Expressive difficulties  Cognition Arousal/Alertness: Awake/alert Behavior During Therapy: WFL for tasks assessed/performed Overall Cognitive Status: Within Functional Limits for tasks assessed  General Comments      Exercises General Exercises - Lower Extremity Ankle Circles/Pumps: AROM;10 reps;Both;Seated Long Arc Quad: AROM;Both;10 reps;Seated Hip Flexion/Marching: Both;10 reps;Seated   Assessment/Plan    PT Assessment Patient needs continued PT services  PT Problem List Decreased balance;Decreased mobility       PT Treatment Interventions Gait training;DME instruction;Stair  training;Functional mobility training;Therapeutic activities;Therapeutic exercise;Balance training;Neuromuscular re-education;Patient/family education    PT Goals (Current goals can be found in the Care Plan section)  Acute Rehab PT Goals Patient Stated Goal: go home; get stronger PT Goal Formulation: With patient/family Time For Goal Achievement: 10/09/21 Potential to Achieve Goals: Good    Frequency Min 4X/week   Barriers to discharge        Co-evaluation               AM-PAC PT "6 Clicks" Mobility  Outcome Measure Help needed turning from your back to your side while in a flat bed without using bedrails?: None Help needed moving from lying on your back to sitting on the side of a flat bed without using bedrails?: None Help needed moving to and from a bed to a chair (including a wheelchair)?: A Little Help needed standing up from a chair using your arms (e.g., wheelchair or bedside chair)?: A Little Help needed to walk in hospital room?: A Little Help needed climbing 3-5 steps with a railing? : A Little 6 Click Score: 20    End of Session Equipment Utilized During Treatment: Gait belt Activity Tolerance: Patient tolerated treatment well Patient left: in bed;with family/visitor present (sitting edge of bed) Nurse Communication: Mobility status PT Visit Diagnosis: Unsteadiness on feet (R26.81)    Time: 1610-9604 PT Time Calculation (min) (ACUTE ONLY): 25 min   Charges:   PT Evaluation $PT Eval Moderate Complexity: 1 Mod PT Treatments $Therapeutic Exercise: 8-22 mins        10/02/2021 Dustin Chandler, PT Acute Rehabilitation Services Pager:  [phone] Office:  [phone]   Dustin Chandler 10/02/2021, 4:28 PM

## 2021-10-02 NOTE — Progress Notes (Signed)
PROGRESS NOTE   Dustin Chandler  WGN:[id number] DOB: 1934-07-29 DOA: 10/01/2021 PCP: Patient, No Pcp Per (Inactive)   Chief Complaint  Patient presents with   Numbness   Level of care: Telemetry Medical  Brief Admission History:   87 y.o. male, with past medical history of hypertension, diabetes mellitus, patient presents with multiple complaints, including tingling, numbness, for prolonged periods of time, but he does report unsteady gait yesterday, as well has some impaired speech earlier today, wife at bedside assists with the history, patient reportedly had unsteady gait yesterday, this morning had trouble moving both of his hands, reports tingling and numbness in all extremities, but reports this has been chronic, as well this morning he has been less verbal, later in the day he is verbal but with very slow speech, prompted wife to bring him to ED, he denies any focal deficits, loss of consciousness or altered mental status.  In ED work-up significant for significant creatinine 0.76, white blood cell of 3.1, platelet at 136, MRI brain significant for left thalamic infarct, [facility] hospitalist consulted to admit.  Assessment & Plan:   Active Problems:   Essential hypertension   DM (diabetes mellitus) (HCC)   Dyslipidemia   Stroke (cerebrum) (HCC)   Acute left thalamic infarct  - Pt seen in ED waiting to transfer to [facility] for neurology consultation - continue stroke work up with Echo, lipid, A1c, PT/OT/SLP evaluation - continue aspirin daily.  - permissive hypertension per teleneurologist, treat BP>220/120 with labetalol or vasotec - neuro checks - notified neurology of transfer to [facility]  Essential hypertension  - per teleneurologist, permissive hypertension, treat BP>220/120  Dyslipidemia - LDL suboptimally controlled - add atorvastatin 40 mg daily  Type 2 diabetes mellitus with vascular complications - continue SSI coverage - a1c 6.0% CBG (last 3)  Recent Labs     10/01/21 2149 10/02/21 0758 10/02/21 1229  GLUCAP 131* 88 96   - holding home metformin in hospital    DVT prophylaxis:  Code Status:  Family Communication:  Disposition:  Status is: Inpatient  Remains inpatient appropriate because: needs MRI, neuro consult, inpatient stroke work up   Consultants:  neurology  Procedures:  N/a  Antimicrobials:  N/a   Subjective: Pt reporting he has been getting more feeling and strength in his right upper extremity.  Otherwise he feels well.    Objective: Vitals:   10/02/21 1230 10/02/21 1245 10/02/21 1300 10/02/21 1400  BP: (!) 186/97  (!) 178/99 (!) 190/99  Pulse: (!) 59 82 (!) 58 (!) 58  Resp: 18 20 (!) 33 16  Temp:      TempSrc:      SpO2: 99% (!) 73% 100% 100%  Weight:      Height:       No intake or output data in the 24 hours ending 10/02/21 1439 Filed Weights   10/01/21 1930  Weight: 93 kg    Examination:  General exam: Appears calm and comfortable  Respiratory system: Clear to auscultation. Respiratory effort normal. Cardiovascular system: normal S1 & S2 heard. No JVD, murmurs, rubs, gallops or clicks. No pedal edema. Gastrointestinal system: Abdomen is nondistended, soft and nontender. No organomegaly or masses felt. Normal bowel sounds heard. Central nervous system: Alert and oriented. No focal neurological deficits. Extremities: Symmetric 5 x 5 power. Skin: No rashes, lesions or ulcers Psychiatry: Judgement and insight appear normal. Mood & affect appropriate.   Data Reviewed: I have personally reviewed following labs and imaging studies  CBC: Recent Labs  Lab 10/01/21 1239 10/02/21 0602  WBC 3.1* 3.8*  NEUTROABS 1.6*  --   HGB 14.2 12.9*  HCT 44.1 40.4  MCV 91.9 91.8  PLT 136* 135*    Basic Metabolic Panel: Recent Labs  Lab 10/01/21 1239 10/02/21 0602  NA 140 140  K 4.5 3.8  CL 106 108  CO2 29 26  GLUCOSE 104* 103*  BUN 18 20  CREATININE 0.76 0.73  CALCIUM 10.0 10.1    GFR: Estimated  Creatinine Clearance: 76.3 mL/min (by C-G formula based on SCr of 0.73 mg/dL).  Liver Function Tests: Recent Labs  Lab 10/01/21 1239 10/02/21 0602  AST 17 14*  ALT 11 11  ALKPHOS 58 50  BILITOT 0.9 0.7  PROT 7.7 6.6  ALBUMIN 4.3 3.6    CBG: Recent Labs  Lab 10/01/21 2149 10/02/21 0758 10/02/21 1229  GLUCAP 131* 88 96    Recent Results (from the past 240 hour(s))  Resp Panel by RT-PCR (Flu A&B, Covid) Nasopharyngeal Swab     Status: None   Collection Time: 10/01/21  1:09 PM   Specimen: Nasopharyngeal Swab; Nasopharyngeal(NP) swabs in vial transport medium  Result Value Ref Range Status   SARS Coronavirus 2 by RT PCR NEGATIVE NEGATIVE Final    Comment: (NOTE) SARS-CoV-2 target nucleic acids are NOT DETECTED.  The SARS-CoV-2 RNA is generally detectable in upper respiratory specimens during the acute phase of infection. The lowest concentration of SARS-CoV-2 viral copies this assay can detect is 138 copies/mL. A negative result does not preclude SARS-Cov-2 infection and should not be used as the sole basis for treatment or other patient management decisions. A negative result may occur with  improper specimen collection/handling, submission of specimen other than nasopharyngeal swab, presence of viral mutation(s) within the areas targeted by this assay, and inadequate number of viral copies(<138 copies/mL). A negative result must be combined with clinical observations, patient history, and epidemiological information. The expected result is Negative.  Fact Sheet for Patients:  BloggerCourse.com  Fact Sheet for Healthcare Providers:  SeriousBroker.it  This test is no t yet approved or cleared by the [place] FDA and  has been authorized for detection and/or diagnosis of SARS-CoV-2 by FDA under an Emergency Use Authorization (EUA). This EUA will remain  in effect (meaning this test can be used) for the duration of  the COVID-19 declaration under Section 564(b)(1) of the Act, 21 U.S.C.section 360bbb-3(b)(1), unless the authorization is terminated  or revoked sooner.       Influenza A by PCR NEGATIVE NEGATIVE Final   Influenza B by PCR NEGATIVE NEGATIVE Final    Comment: (NOTE) The Xpert Xpress SARS-CoV-2/FLU/RSV plus assay is intended as an aid in the diagnosis of influenza from Nasopharyngeal swab specimens and should not be used as a sole basis for treatment. Nasal washings and aspirates are unacceptable for Xpert Xpress SARS-CoV-2/FLU/RSV testing.  Fact Sheet for Patients: BloggerCourse.com  Fact Sheet for Healthcare Providers: SeriousBroker.it  This test is not yet approved or cleared by the [place] FDA and has been authorized for detection and/or diagnosis of SARS-CoV-2 by FDA under an Emergency Use Authorization (EUA). This EUA will remain in effect (meaning this test can be used) for the duration of the COVID-19 declaration under Section 564(b)(1) of the Act, 21 U.S.C. section 360bbb-3(b)(1), unless the authorization is terminated or revoked.  Performed at [facility], [address] [place], [place] [address]      Radiology Studies: CT ANGIO HEAD NECK W WO CM  Result  Date: 10/01/2021 CLINICAL DATA:  Extremity numbness. EXAM: CT ANGIOGRAPHY HEAD AND NECK TECHNIQUE: Multidetector CT imaging of the head and neck was performed using the standard protocol during bolus administration of intravenous contrast. Multiplanar CT image reconstructions and MIPs were obtained to evaluate the vascular anatomy. Carotid stenosis measurements (when applicable) are obtained utilizing NASCET criteria, using the distal internal carotid diameter as the denominator. CONTRAST:  OMNIPAQUE IOHEXOL 350 MG/ML SOLN COMPARISON:  None. FINDINGS: CTA NECK FINDINGS SKELETON: There is no bony spinal canal stenosis. No lytic or blastic lesion. OTHER NECK:  Normal pharynx, larynx and major salivary glands. No cervical lymphadenopathy. Unremarkable thyroid gland. UPPER CHEST: No pneumothorax or pleural effusion. No nodules or masses. AORTIC ARCH: There is calcific atherosclerosis of the aortic arch. There is no aneurysm, dissection or hemodynamically significant stenosis of the visualized portion of the aorta. Conventional 3 vessel aortic branching pattern. The visualized proximal subclavian arteries are widely patent. RIGHT CAROTID SYSTEM: No dissection, occlusion or aneurysm. Mild atherosclerotic calcification at the carotid bifurcation without hemodynamically significant stenosis. LEFT CAROTID SYSTEM: Normal without aneurysm, dissection or stenosis. VERTEBRAL ARTERIES: Codominant configuration. Both origins are clearly patent. There is no dissection, occlusion or flow-limiting stenosis to the skull base (V1-V3 segments). CTA HEAD FINDINGS POSTERIOR CIRCULATION: --Vertebral arteries: Normal V4 segments. --Inferior cerebellar arteries: Normal. --Basilar artery: Normal. --Superior cerebellar arteries: Normal. --Posterior cerebral arteries (PCA): Normal. ANTERIOR CIRCULATION: --Intracranial internal carotid arteries: Normal. --Anterior cerebral arteries (ACA): Normal. Both A1 segments are present. Patent anterior communicating artery (a-comm). --Middle cerebral arteries (MCA): Normal. VENOUS SINUSES: As permitted by contrast timing, patent. ANATOMIC VARIANTS: Fetal origins of both posterior cerebral arteries. Review of the MIP images confirms the above findings. IMPRESSION: No emergent large vessel occlusion or high-grade stenosis of the intracranial or cervical arteries. Aortic atherosclerosis (ICD10-I70.0). Electronically Signed   By: Deatra Robinson M.D.   On: 10/01/2021 23:03   CT HEAD WO CONTRAST  Result Date: 10/01/2021 CLINICAL DATA:  Mental status change, unknown cause. Additional history provided: Numbness in extremities, history of diabetes mellitus,  hypertension. EXAM: CT HEAD WITHOUT CONTRAST TECHNIQUE: Contiguous axial images were obtained from the base of the skull through the vertex without intravenous contrast. COMPARISON:  Noncontrast head CT and CT perfusion head 01/04/2020. FINDINGS: Brain: Mild generalized cerebral atrophy. Chronic small-vessel infarcts within the right corona radiata/basal ganglia, progressed in number as compared to the prior head CT of 01/04/2020. Background mild patchy and ill-defined hypoattenuation within the cerebral white matter, nonspecific but compatible chronic small vessel ischemic disease. Redemonstrated chronic lacunar infarct within the medial right cerebellar hemisphere (series 2, image 11). There is no acute intracranial hemorrhage. No demarcated cortical infarct. No extra-axial fluid collection. No evidence of an intracranial mass. No midline shift. Vascular: No hyperdense vessel.  Atherosclerotic calcifications. Skull: Normal. Negative for fracture or focal lesion. Sinuses/Orbits: Visualized orbits show no acute finding. Trace mucosal thickening within the bilateral ethmoid air cells. Small-volume fluid within a posterior left ethmoid air cell. IMPRESSION: No evidence of acute intracranial abnormality. Chronic small-vessel infarcts within the right corona radiata/basal ganglia, progressed in number as compared to the head CT of 01/04/2020. Background mild chronic small vessel ischemic changes within the cerebral white matter. Redemonstrated chronic lacunar infarct within the right cerebellar hemisphere. Mild generalized cerebral atrophy. Mild paranasal sinus disease, as described. Electronically Signed   By: Jackey Loge D.O.   On: 10/01/2021 13:24   MR ANGIO HEAD WO CONTRAST  Result Date: 10/01/2021 CLINICAL DATA:  Neuro deficit, acute, stroke suspected  EXAM: MRI HEAD WITHOUT CONTRAST MRA HEAD WITHOUT CONTRAST TECHNIQUE: Multiplanar, multi-echo pulse sequences of the brain and surrounding structures were  acquired without intravenous contrast. Angiographic images of the Circle of Willis were acquired using MRA technique without intravenous contrast. COMPARISON:  No pertinent prior exam. FINDINGS: MRI HEAD Brain: There is a 8 mm focus of reduced diffusion within the left thalamus. Chronic infarcts of the right lentiform nucleus and corona radiata with some associated chronic blood products. Small chronic right precentral gyrus cortical infarct. Small chronic right cerebellar infarct. Additional patchy T2 hyperintensity in the supratentorial white matter is nonspecific but may reflect mild chronic microvascular ischemic changes. Prominence of the ventricles and sulci reflects mild parenchymal volume loss. Vascular: Major vessel flow voids at the skull base are preserved. Skull and upper cervical spine: Normal marrow signal is preserved. Sinuses/Orbits: Paranasal sinuses are aerated. Orbits are unremarkable. Other: Sella is unremarkable.  Mastoid air cells are clear. MRA HEAD Intracranial internal carotid arteries are patent. Middle and anterior cerebral arteries are patent. Mild atherosclerotic irregularity distal MCA branches. Focal high-grade stenosis distal right A2 ACA. Intracranial vertebral arteries, basilar artery, posterior cerebral arteries are patent. Bilateral posterior communicating arteries are present with fetal origin of the posterior cerebral arteries. Focal high-grade stenosis left P2 PCA. No aneurysm. IMPRESSION: Acute left thalamic infarct. Chronic infarcts and chronic microvascular ischemic changes. No large vessel occlusion. Focal high-grade stenoses distal right A2 ACA and left P2 PCA. Electronically Signed   By: Guadlupe Spanish M.D.   On: 10/01/2021 16:19   MR BRAIN WO CONTRAST  Result Date: 10/01/2021 CLINICAL DATA:  Neuro deficit, acute, stroke suspected EXAM: MRI HEAD WITHOUT CONTRAST MRA HEAD WITHOUT CONTRAST TECHNIQUE: Multiplanar, multi-echo pulse sequences of the brain and surrounding  structures were acquired without intravenous contrast. Angiographic images of the Circle of Willis were acquired using MRA technique without intravenous contrast. COMPARISON:  No pertinent prior exam. FINDINGS: MRI HEAD Brain: There is a 8 mm focus of reduced diffusion within the left thalamus. Chronic infarcts of the right lentiform nucleus and corona radiata with some associated chronic blood products. Small chronic right precentral gyrus cortical infarct. Small chronic right cerebellar infarct. Additional patchy T2 hyperintensity in the supratentorial white matter is nonspecific but may reflect mild chronic microvascular ischemic changes. Prominence of the ventricles and sulci reflects mild parenchymal volume loss. Vascular: Major vessel flow voids at the skull base are preserved. Skull and upper cervical spine: Normal marrow signal is preserved. Sinuses/Orbits: Paranasal sinuses are aerated. Orbits are unremarkable. Other: Sella is unremarkable.  Mastoid air cells are clear. MRA HEAD Intracranial internal carotid arteries are patent. Middle and anterior cerebral arteries are patent. Mild atherosclerotic irregularity distal MCA branches. Focal high-grade stenosis distal right A2 ACA. Intracranial vertebral arteries, basilar artery, posterior cerebral arteries are patent. Bilateral posterior communicating arteries are present with fetal origin of the posterior cerebral arteries. Focal high-grade stenosis left P2 PCA. No aneurysm. IMPRESSION: Acute left thalamic infarct. Chronic infarcts and chronic microvascular ischemic changes. No large vessel occlusion. Focal high-grade stenoses distal right A2 ACA and left P2 PCA. Electronically Signed   By: Guadlupe Spanish M.D.   On: 10/01/2021 16:19   ECHOCARDIOGRAM COMPLETE  Result Date: 10/02/2021    ECHOCARDIOGRAM REPORT   Patient Name:   DANEL REQUENA Date of Exam: 10/02/2021 Medical Rec #:  [id number]          Height:       73.0 in Accession #:    [id number]  Weight:       205.0 lb Date of Birth:  August 08, 1934           BSA:          2.174 m Patient Age:    87 years           BP:           172/90 mmHg Patient Gender: M                  HR:           57 bpm. [facility] Procedure: 2D Echo, Cardiac Doppler and Color Doppler Indications:    Stroke  History:        Patient has no prior history of Echocardiogram examinations.                 Stroke; Risk Factors:Hypertension, Diabetes and Dyslipidemia.  Sonographer:    Mikki Harbor Referring Phys: [id number] DAWOOD S ELGER[place]WY IMPRESSIONS  1. Left ventricular ejection fraction, by estimation, is 55 to 60%. The left ventricle has normal function. The left ventricle has no regional wall motion abnormalities. There is mild left ventricular hypertrophy. Left ventricular diastolic parameters are consistent with Grade I diastolic dysfunction (impaired relaxation).  2. Right ventricular systolic function is normal. The right ventricular size is normal. Tricuspid regurgitation signal is inadequate for assessing PA pressure.  3. The mitral valve is normal in structure. Trivial mitral valve regurgitation. No evidence of mitral stenosis.  4. The aortic valve is grossly normal. Aortic valve regurgitation is not visualized. No aortic stenosis is present. Conclusion(s)/Recommendation(s): No intracardiac source of embolism detected on this transthoracic study. FINDINGS  Left Ventricle: Left ventricular ejection fraction, by estimation, is 55 to 60%. The left ventricle has normal function. The left ventricle has no regional wall motion abnormalities. The left ventricular internal cavity size was normal in size. There is  mild left ventricular hypertrophy. Left ventricular diastolic parameters are consistent with Grade I diastolic dysfunction (impaired relaxation). Right Ventricle: The right ventricular size is normal. No increase in right ventricular wall thickness. Right ventricular systolic function is normal. Tricuspid  regurgitation signal is inadequate for assessing PA pressure. Left Atrium: Left atrial size was normal in size. Right Atrium: Right atrial size was normal in size. Pericardium: There is no evidence of pericardial effusion. Mitral Valve: The mitral valve is normal in structure. Trivial mitral valve regurgitation. No evidence of mitral valve stenosis. MV peak gradient, 2.8 mmHg. The mean mitral valve gradient is 1.0 mmHg. Tricuspid Valve: The tricuspid valve is normal in structure. Tricuspid valve regurgitation is trivial. No evidence of tricuspid stenosis. Aortic Valve: The aortic valve is grossly normal. Aortic valve regurgitation is not visualized. No aortic stenosis is present. Aortic valve mean gradient measures 2.0 mmHg. Aortic valve peak gradient measures 4.3 mmHg. Aortic valve area, by VTI measures 2.47 cm. Pulmonic Valve: The pulmonic valve was not well visualized. Pulmonic valve regurgitation is trivial. No evidence of pulmonic stenosis. Aorta: The aortic root is normal in size and structure. Venous: The inferior vena cava was not well visualized. IAS/Shunts: The interatrial septum was not well visualized.  LEFT VENTRICLE PLAX 2D LVIDd:         4.45 cm     Diastology LVIDs:         3.05 cm     LV e' medial:    6.96 cm/s LV PW:         1.10 cm  LV E/e' medial:  8.6 LV IVS:        1.10 cm     LV e' lateral:   6.96 cm/s LVOT diam:     2.00 cm     LV E/e' lateral: 8.6 LV SV:         59 LV SV Index:   27 LVOT Area:     3.14 cm  LV Volumes (MOD) LV vol d, MOD A2C: 68.5 ml LV vol d, MOD A4C: 84.4 ml LV vol s, MOD A2C: 34.2 ml LV vol s, MOD A4C: 37.6 ml LV SV MOD A2C:     34.3 ml LV SV MOD A4C:     84.4 ml LV SV MOD BP:      43.1 ml RIGHT VENTRICLE RV Basal diam:  4.00 cm RV Mid diam:    2.50 cm RV S prime:     10.10 cm/s TAPSE (M-mode): 2.3 cm LEFT ATRIUM             Index        RIGHT ATRIUM           Index LA diam:        3.50 cm 1.61 cm/m   RA Area:     19.60 cm LA Vol (A2C):   85.7 ml 39.41 ml/m  RA  Volume:   58.70 ml  27.00 ml/m LA Vol (A4C):   43.7 ml 20.10 ml/m LA Biplane Vol: 62.7 ml 28.83 ml/m  AORTIC VALVE AV Area (Vmax):    2.07 cm AV Area (Vmean):   1.77 cm AV Area (VTI):     2.47 cm AV Vmax:           104.00 cm/s AV Vmean:          69.100 cm/s AV VTI:            0.240 m AV Peak Grad:      4.3 mmHg AV Mean Grad:      2.0 mmHg LVOT Vmax:         68.50 cm/s LVOT Vmean:        39.000 cm/s LVOT VTI:          0.189 m LVOT/AV VTI ratio: 0.79  AORTA Ao Root diam: 3.80 cm MITRAL VALVE MV Area (PHT): 2.50 cm    SHUNTS MV Area VTI:   2.17 cm    Systemic VTI:  0.19 m MV Peak grad:  2.8 mmHg    Systemic Diam: 2.00 cm MV Mean grad:  1.0 mmHg MV Vmax:       0.84 m/s MV Vmean:      39.3 cm/s MV Decel Time: 304 msec MV E velocity: 59.60 cm/s MV A velocity: 65.60 cm/s MV E/A ratio:  0.91 Weston Brass MD Electronically signed by Weston Brass MD Signature Date/Time: 10/02/2021/12:15:42 PM    Final     Scheduled Meds:   stroke: mapping our early stages of recovery book   Does not apply Once   aspirin  300 mg Rectal Daily   Or   aspirin  325 mg Oral Daily   enoxaparin (LOVENOX) injection  40 mg Subcutaneous Q24H   insulin aspart  0-5 Units Subcutaneous QHS   insulin aspart  0-9 Units Subcutaneous TID WC   Continuous Infusions:  sodium chloride 50 mL/hr at 10/01/21 2308     LOS: 1 day   Time spent: 37 mins   Standley Dakins, MD How to contact the [facility] Attending or Consulting provider  7A - 7P or covering provider during after hours 7P -7A, for this patient?  Check the care team in [facility] and look for a) attending/consulting [facility] provider listed and b) the [facility] team listed Log into [email or web address] and use [place]'s universal password to access. If you do not have the password, please contact the hospital operator. Locate the [facility] provider you are looking for under [facility] and page to a number that you can be directly reached. If you still have difficulty reaching the provider,  please page the [facility] (Director on Call) for the Hospitalists listed on amion for assistance.  10/02/2021, 2:39 PM

## 2021-10-02 NOTE — Progress Notes (Signed)
10/02/2021 Patient transfer from [facility] to [facility] patient via carelink. He is alert, oriented to person, placed, time and situation. He was placed on telemetry and Md was made aware patient is here. Patient skin was assess it was intact. Moles were noted on right upper and lower leg. [facility] RN.

## 2021-10-02 NOTE — ED Notes (Signed)
MD made aware of pts BP 192/112

## 2021-10-02 NOTE — Progress Notes (Signed)
2D Echocardiogram completed.  Mikki Harbor, RDCS.

## 2021-10-02 NOTE — Clinical Social Work Note (Signed)
[facility] notification done [phone]

## 2021-10-03 DIAGNOSIS — I10 Essential (primary) hypertension: Secondary | ICD-10-CM

## 2021-10-03 DIAGNOSIS — I63532 Cerebral infarction due to unspecified occlusion or stenosis of left posterior cerebral artery: Secondary | ICD-10-CM

## 2021-10-03 DIAGNOSIS — I639 Cerebral infarction, unspecified: Secondary | ICD-10-CM | POA: Diagnosis not present

## 2021-10-03 DIAGNOSIS — E1159 Type 2 diabetes mellitus with other circulatory complications: Secondary | ICD-10-CM

## 2021-10-03 DIAGNOSIS — I6381 Other cerebral infarction due to occlusion or stenosis of small artery: Secondary | ICD-10-CM

## 2021-10-03 DIAGNOSIS — E785 Hyperlipidemia, unspecified: Secondary | ICD-10-CM

## 2021-10-03 DIAGNOSIS — G629 Polyneuropathy, unspecified: Secondary | ICD-10-CM

## 2021-10-03 LAB — GLUCOSE, CAPILLARY
Glucose-Capillary: 109 mg/dL — ABNORMAL HIGH (ref 70–99)
Glucose-Capillary: 134 mg/dL — ABNORMAL HIGH (ref 70–99)
Glucose-Capillary: 92 mg/dL (ref 70–99)

## 2021-10-03 LAB — TSH: TSH: 4.114 u[IU]/mL (ref 0.350–4.500)

## 2021-10-03 LAB — FOLATE: Folate: 12.4 ng/mL (ref >5.9)

## 2021-10-03 LAB — VITAMIN B12: Vitamin B-12: 193 pg/mL (ref 180–914)

## 2021-10-03 MED ORDER — VITAMIN B-12 1000 MCG PO TABS: 1000.0000 ug | ORAL_TABLET | Freq: Every day | ORAL | 3 refills | Status: DC

## 2021-10-03 MED ORDER — CLOPIDOGREL BISULFATE 75 MG PO TABS
75.0000 mg | ORAL_TABLET | Freq: Every day | ORAL | Status: DC
  Administered 2021-10-03: 75 mg via ORAL
  Filled 2021-10-03: qty 1

## 2021-10-03 MED ORDER — HYDRALAZINE HCL 25 MG PO TABS: 25.0000 mg | ORAL_TABLET | Freq: Two times a day (BID) | ORAL | 11 refills | Status: AC

## 2021-10-03 MED ORDER — ATORVASTATIN CALCIUM 40 MG PO TABS: 40.0000 mg | ORAL_TABLET | Freq: Every evening | ORAL | 3 refills | Status: DC

## 2021-10-03 MED ORDER — CLOPIDOGREL BISULFATE 75 MG PO TABS: 75.0000 mg | ORAL_TABLET | Freq: Every day | ORAL | 2 refills | Status: AC

## 2021-10-03 MED ORDER — ASPIRIN 325 MG PO TABS: 325.0000 mg | ORAL_TABLET | Freq: Every day | ORAL | 5 refills | Status: DC

## 2021-10-03 NOTE — Evaluation (Signed)
Occupational Therapy Evaluation Patient Details Name: Dustin Chandler MRN: [id number] DOB: May 23, 1934 Today's Date: 10/03/2021   History of Present Illness 87 y.o. male presents with c/o numbness, tingling and unsteady gait.  MRI brain significant for left thalamic infarct, past medical history significant for hypertension, diabetes mellitus.   Clinical Impression   Pt admitted for concerns listed above. PTA Pt reported that he was independent with all ADL's and IADL's, including working in his garden and doing yard work. At this time, pt needs no physical assistance, however due to mildly increased weakness and balance concerns he will benefit from supervision during higher level activities such as stairs and bathing. Pt educated on energy conservation and fall prevention. Recommend HHOT once discharged to maximize his return to independence. Pt has no further acute OT needs and OT will sign off.       Recommendations for follow up therapy are one component of a multi-disciplinary discharge planning process, led by the attending physician.  Recommendations may be updated based on patient status, additional functional criteria and insurance authorization.   Follow Up Recommendations  Home health OT    Equipment Recommendations  Tub/shower seat    Recommendations for Other Services       Precautions / Restrictions Precautions Precautions: Fall Restrictions Weight Bearing Restrictions: No      Mobility Bed Mobility Overal bed mobility: Modified Independent             General bed mobility comments: used railing to come to edge of bed    Transfers Overall transfer level: Needs assistance Equipment used: Rolling walker (2 wheeled) Transfers: Sit to/from Stand Sit to Stand: Supervision         General transfer comment: No physical assist, Supervision for safety    Balance Overall balance assessment: Needs assistance Sitting-balance support: No upper extremity  supported;Feet supported Sitting balance-Leahy Scale: Good     Standing balance support: No upper extremity supported Standing balance-Leahy Scale: Fair Standing balance comment: Static standing good, dynamic standing, pt needs 1 UE for support                           ADL either performed or assessed with clinical judgement   ADL Overall ADL's : Needs assistance/impaired                                       General ADL Comments: Pt at an overall supervision level which family reported that they are able to provide for pt.     Vision Baseline Vision/History: 1 Wears glasses Ability to See in Adequate Light: 0 Adequate Patient Visual Report: No change from baseline Vision Assessment?: No apparent visual deficits     Perception Perception Perception Tested?: No   Praxis Praxis Praxis tested?: Within functional limits    Pertinent Vitals/Pain Pain Assessment: No/denies pain     Hand Dominance Left   Extremity/Trunk Assessment Upper Extremity Assessment Upper Extremity Assessment: Overall WFL for tasks assessed   Lower Extremity Assessment Lower Extremity Assessment: Defer to PT evaluation   Cervical / Trunk Assessment Cervical / Trunk Assessment: Kyphotic   Communication Communication Communication: Expressive difficulties   Cognition Arousal/Alertness: Awake/alert Behavior During Therapy: WFL for tasks assessed/performed Overall Cognitive Status: Within Functional Limits for tasks assessed  General Comments  VSS on RA    Exercises     Shoulder Instructions      Home Living Family/patient expects to be discharged to:: Private residence Living Arrangements: Spouse/significant other;Children Available Help at Discharge: Family;Available 24 hours/day Type of Home: House Home Access: Stairs to enter     Home Layout: Multi-level Alternate Level Stairs-Number of Steps: 5 steps  to next level   Bathroom Shower/Tub: Chief Strategy Officer: Standard Bathroom Accessibility: Yes How Accessible: Accessible via walker Home Equipment: Walker - 2 wheels          Prior Functioning/Environment Level of Independence: Independent                 OT Problem List: Decreased strength;Impaired balance (sitting and/or standing);Decreased activity tolerance;Decreased coordination      OT Treatment/Interventions:      OT Goals(Current goals can be found in the care plan section) Acute Rehab OT Goals Patient Stated Goal: go home; get stronger OT Goal Formulation: With patient Time For Goal Achievement: 10/03/21 Potential to Achieve Goals: Good  OT Frequency:     Barriers to D/C:            Co-evaluation              AM-PAC OT "6 Clicks" Daily Activity     Outcome Measure Help from another person eating meals?: None Help from another person taking care of personal grooming?: A Little Help from another person toileting, which includes using toliet, bedpan, or urinal?: A Little Help from another person bathing (including washing, rinsing, drying)?: A Little Help from another person to put on and taking off regular upper body clothing?: A Little Help from another person to put on and taking off regular lower body clothing?: A Little 6 Click Score: 19   End of Session Equipment Utilized During Treatment: Gait belt;Rolling walker Nurse Communication: Mobility status  Activity Tolerance: Patient tolerated treatment well Patient left: in bed;with call bell/phone within reach;with family/visitor present  OT Visit Diagnosis: Unsteadiness on feet (R26.81);Other abnormalities of gait and mobility (R26.89);Muscle weakness (generalized) (M62.81)                Time: 7829-5621 OT Time Calculation (min): 31 min Charges:  OT General Charges $OT Visit: 1 Visit OT Evaluation $OT Eval Moderate Complexity: 1 Mod OT Treatments $Self Care/Home  Management : 8-22 mins  Dustin Chandler H., OTR/L Acute Rehabilitation  Dustin Chandler Elane Dustin Chandler 10/03/2021, 6:21 PM

## 2021-10-03 NOTE — Consult Note (Signed)
NEUROLOGY CONSULTATION NOTE   Date of service: October 03, 2021 Patient Name: Dustin Chandler MRN:  [id number] DOB:  [birth year] Reason for consult: "unsteady gait with impaired speech and numbness and tingling" Requesting Provider: Meredeth Ide, MD _ _ _   _ __   _ __ _ _  __ __   _ __   __ _  History of Present Illness  Dustin Chandler is a 87 y.o. male with PMH significant for HTN, DM2, diverticulosis who presents with numbness in R hand and weakness in R hand. Unclear when the symptoms exactly started but feels they started on Wednesday and he woke up on the 14th with worsening symptoms. Workup with MRI demonstrated a left thalamic stroke.  Also endorses long stnading hx of BL lower extremity neuropathy. Does not smoke an dquit in 1999, no EtOh intake. Reports long standing diabetes and trouble with glucose control.  LKW: 09/29/21, weakness worsened on 10/01/21. mRS: 1 tNK/thrombectomy: outside window NIHSS components Score: Comment  1a Level of Conscious 0[x]  1[]  2[]  3[]      1b LOC Questions 0[x]  1[]  2[]       1c LOC Commands 0[x]  1[]  2[]       2 Best Gaze 0[x]  1[]  2[]       3 Visual 0[x]  1[]  2[]  3[]      4 Facial Palsy 0[x]  1[]  2[]  3[]      5a Motor Arm - left 0[x]  1[]  2[]  3[]  4[]  UN[]    5b Motor Arm - Right 0[x]  1[]  2[]  3[]  4[]  UN[]    6a Motor Leg - Left 0[x]  1[]  2[]  3[]  4[]  UN[]    6b Motor Leg - Right 0[x]  1[]  2[]  3[]  4[]  UN[]    7 Limb Ataxia 0[]  1[]  2[x]  3[]  UN[]   BL lower ext ataxia that is chronic from neuropathy  8 Sensory 0[]  1[x]  2[]  UN[]      9 Best Language 0[x]  1[]  2[]  3[]      10 Dysarthria 0[x]  1[]  2[]  UN[]      11 Extinct. and Inattention 0[x]  1[]  2[]       TOTAL: 3        ROS   Constitutional Denies weight loss, fever and chills.   HEENT Denies changes in vision and hearing.   Respiratory Denies SOB and cough.   CV Denies palpitations and CP   GI Denies abdominal pain, nausea, vomiting and diarrhea.   GU Denies dysuria and urinary frequency.   MSK Denies  myalgia and joint pain.   Skin Denies rash and pruritus.   Neurological Denies headache and syncope.   Psychiatric Denies recent changes in mood. Denies anxiety and depression.    Past History   Past Medical History:  Diagnosis Date  . Colon polyps 2011   Non cancerous   . Diabetes mellitus   . Diverticulosis   . Elevated PSA 2010   dr. Neoma Laming follows   . Hypertension   . Impaired glucose tolerance   . PTSD (post-traumatic stress disorder)   . Ulnar nerve palsy 2009   right   Past Surgical History:  Procedure Laterality Date  . CYSTOSTOMY W/ BLADDER BIOPSY    . removal of benign mole     15 years ago   Family History  Problem Relation Age of Onset  . Heart failure Mother   . Pneumonia Father   . Coronary artery disease Brother        bowel obstruction  . Pneumonia Sister   . Stroke Sister   . Aneurysm Sister  Social History   Socioeconomic History  . Marital status: Married    Spouse name: Not on file  . Number of children: 4  . Years of education: Not on file  . Highest education level: Not on file  Occupational History  . Occupation: Employed   Tobacco Use  . Smoking status: Former    Types: Cigarettes  . Smokeless tobacco: Never  Substance and Sexual Activity  . Alcohol use: No  . Drug use: No  . Sexual activity: Not on file  Other Topics Concern  . Not on file  Social History Narrative  . Not on file   Social Determinants of Health   Financial Resource Strain: Not on file  Food Insecurity: Not on file  Transportation Needs: Not on file  Physical Activity: Not on file  Stress: Not on file  Social Connections: Not on file   Allergies  Allergen Reactions  . Lisinopril Swelling    Lip swelling  . Amlodipine   . Penicillins     Has patient had a PCN reaction causing immediate rash, facial/tongue/throat swelling, SOB or lightheadedness with hypotension: No Has patient had a PCN reaction causing severe rash involving mucus membranes or skin  necrosis: No Has patient had a PCN reaction that required hospitalization No Has patient had a PCN reaction occurring within the last 10 years: No  "Locked my knee up" If all of the above answers are "NO", then may proceed with Cephalosporin use.     Medications   Medications Prior to Admission  Medication Sig Dispense Refill Last Dose  . cetirizine (ZYRTEC) 10 MG tablet Take 10 mg by mouth daily as needed.   Past Week  . Cholecalciferol (VITAMIN D3) LIQD 1 drop by Does not apply route daily.   10/01/2021  . cloNIDine (CATAPRES) 0.1 MG tablet Take 0.1 mg by mouth daily.   09/30/2021  . metFORMIN (GLUCOPHAGE-XR) 500 MG 24 hr tablet Take 500 mg by mouth daily.   09/30/2021  . OVER THE COUNTER MEDICATION Take 1 tablet by mouth daily. Curemed     . thiamine (VITAMIN B-1) 100 MG tablet Take 100 mg by mouth daily.   Past Week  . Turmeric (QC TUMERIC COMPLEX PO) Take 1 tablet by mouth daily.   09/30/2021  . cholecalciferol (VITAMIN D) 1000 units tablet Take 1,000 Units by mouth daily. (Patient not taking: Reported on 10/01/2021)   Not Taking  . cyanocobalamin 100 MCG tablet Take 1 tablet by mouth daily. (Patient not taking: Reported on 10/01/2021)   Not Taking  . lisinopril (PRINIVIL,ZESTRIL) 40 MG tablet Take 40 mg by mouth daily. (Patient not taking: Reported on 10/01/2021)   Not Taking  . losartan (COZAAR) 100 MG tablet Take 100 mg by mouth daily. (Patient not taking: Reported on 10/01/2021)   Not Taking  . meclizine (ANTIVERT) 25 MG tablet Take 1 tablet (25 mg total) by mouth 3 (three) times daily as needed for dizziness. (Patient not taking: Reported on 10/01/2021) 30 tablet 0 Not Taking  . metFORMIN (GLUCOPHAGE-XR) 500 MG 24 hr tablet Take 1 tablet by mouth daily. (Patient not taking: Reported on 10/01/2021)   Not Taking  . ondansetron (ZOFRAN) 4 MG tablet Take 1 tablet (4 mg total) by mouth every 8 (eight) hours as needed for nausea or vomiting. (Patient not taking: Reported on 10/01/2021)  10 tablet 0 Not Taking  . zinc gluconate 50 MG tablet Take 1 tablet by mouth daily. (Patient not taking: Reported on 10/01/2021)   Not  Taking     Vitals   Vitals:   10/02/21 1300 10/02/21 1400 10/02/21 1518 10/02/21 2104  BP: (!) 178/99 (!) 190/99 (!) 199/94 (!) 173/95  Pulse: (!) 58 (!) 58 (!) 55   Resp: (!) 33 16  12  Temp:   98.6 F (37 C) 98.8 F (37.1 C)  TempSrc:   Oral Oral  SpO2: 100% 100% 100% 99%  Weight:   79.3 kg   Height:   6\' 1"  (1.854 m)      Body mass index is 23.07 kg/m.  Physical Exam   General: Laying comfortably in bed; in no acute distress.  HENT: Normal oropharynx and mucosa. Normal external appearance of ears and nose.  Neck: Supple, no pain or tenderness  CV: No JVD. No peripheral edema.  Pulmonary: Symmetric Chest rise. Normal respiratory effort.  Abdomen: Soft to touch, non-tender.  Ext: No cyanosis, edema, or deformity  Skin: No rash. Normal palpation of skin.   Musculoskeletal: Normal digits and nails by inspection. No clubbing.   Neurologic Examination  Mental status/Cognition: Alert, oriented to self, place, month and year, good attention.  Speech/language: Fluent, comprehension intact, object naming intact, repetition intact.  Cranial nerves:   CN II Pupils equal and reactive to light, no VF deficits    CN III,IV,VI EOM intact, no gaze preference or deviation, no nystagmus    CN V normal sensation in V1, V2, and V3 segments bilaterally    CN VII no asymmetry, no nasolabial fold flattening    CN VIII normal hearing to speech    CN IX & X normal palatal elevation, no uvular deviation    CN XI 5/5 head turn and 5/5 shoulder shrug bilaterally    CN XII midline tongue protrusion    Motor:  Muscle bulk: normal, tone none, pronator drift none tremor none Mvmt Root Nerve  Muscle Right Left Comments  SA C5/6 Ax Deltoid 5 5   EF C5/6 Mc Biceps 5 5   EE C6/7/8 Rad Triceps 5 5   WF C6/7 Med FCR     WE C7/8 PIN ECU     F Ab C8/T1 U ADM/FDI 4 5    HF L1/2/3 Fem Illopsoas 5 5   KE L2/3/4 Fem Quad 5 5   DF L4/5 D Peron Tib Ant 5 5   PF S1/2 Tibial Grc/Sol 5 5    Reflexes:  Right Left Comments  Pectoralis      Biceps (C5/6) 2 2   Brachioradialis (C5/6) 1 1    Triceps (C6/7) 1 1    Patellar (L3/4) 1 1    Achilles (S1) 0 0    Hoffman      Plantar     Jaw jerk    Sensation:  Light touch Decreased in R hand   Pin prick    Temperature    Vibration   Proprioception    Coordination/Complex Motor:  - Finger to Nose intact BL - Heel to shin with BL ataxia. - Rapid alternating movement are slowed in RUE - Gait: unsafe to assess given the extent of ataxia and incoordination of BL lower extremities.  Labs   CBC:  Recent Labs  Lab 10/01/21 1239 10/02/21 0602  WBC 3.1* 3.8*  NEUTROABS 1.6*  --   HGB 14.2 12.9*  HCT 44.1 40.4  MCV 91.9 91.8  PLT 136* 135*    Basic Metabolic Panel:  Lab Results  Component Value Date   NA 140 10/02/2021   K 3.8  10/02/2021   CO2 26 10/02/2021   GLUCOSE 103 (H) 10/02/2021   BUN 20 10/02/2021   CREATININE 0.73 10/02/2021   CALCIUM 10.1 10/02/2021   GFRNONAA >60 10/02/2021   GFRAA >60 01/03/2020   Lipid Panel:  Lab Results  Component Value Date   LDLCALC 91 10/02/2021   HgbA1c:  Lab Results  Component Value Date   HGBA1C 6.0 (H) 10/02/2021   Urine Drug Screen:     Component Value Date/Time   LABOPIA NONE DETECTED 10/01/2021 1451   COCAINSCRNUR NONE DETECTED 10/01/2021 1451   LABBENZ NONE DETECTED 10/01/2021 1451   AMPHETMU NONE DETECTED 10/01/2021 1451   THCU NONE DETECTED 10/01/2021 1451   LABBARB NONE DETECTED 10/01/2021 1451    Alcohol Level     Component Value Date/Time   ETH <10 10/01/2021 1239    CT Head without contrast: Personally reviewed and CTH was negative for a large hypodensity concerning for a large territory infarct or hyperdensity concerning for an ICH  CT angio Head and Neck with contrast: Personally reviewed and no LVO  MRI  Brain: Personally reviewed and notable for a L thalamic stroke.  Impression   STOKES RATTIGAN is a 87 y.o. male with PMH significant for HTN, DM2, diverticulosis who presents with numbness in R hand and weakness in R hand. Found to have a L thalamic stroke. His neurologic examination is notable for R hand weakness and numbness with BL lower extremity ataxia and tingling with burning in his feet that is chronic from longstanding neuropathy. Reports trecently saw a doctor for his neuropathy but not sure if it was a neurologist.  Primary Diagnosis:  Other cerebral infarction due to occlusion of stenosis of small artery.  Secondary Diagnosis: Essential (primary) hypertension and Type 2 diabetes mellitus with hyperglycemia  Length dependent sensory neuropathy.  Recommendations   Plan:  - Frequent Neuro checks per stroke unit protocol - MRI with L thalamic stroke - CTA with no LVO - TTE with EF of 55-60%, no IAS/shunts visualized - Recommend obtaining Lipid panel with LDL - LDL: 91, agree with atorvastatin 40mg  daily - HbA1c of 6.0 - Antithrombotic - Aspirin 81mg  daily along with plavix 75mg  daily for 21 days followed by Aspirin 81mg  daily alone. - Recommend DVT ppx - SBP goal - permissive hypertension first 24 h < 220/110. Held home meds.  - Recommend Telemetry monitoring for arrythmia - Recommend bedside swallow screen prior to PO intake. - Stroke education booklet - Recommend PT/OT/SLP consult   Length dependent sensory neuropathy: - Vit B12 with MMA, TSH, Folate, Vit B6, SPEP. - follow with neurology outpatient.  ______________________________________________________________________   Thank you for the opportunity to take part in the care of this patient. If you have any further questions, please contact the neurology consultation attending.  Signed,  [facility] Pager Number _ _ _   _ __   _ __ _ _  __ __   _ __   __ _

## 2021-10-03 NOTE — Discharge Instructions (Signed)
Take aspirin and Plavix together for 3 months then stop taking Plavix and continue taking aspirin alone

## 2021-10-03 NOTE — Discharge Summary (Addendum)
Physician Discharge Summary  Dustin Chandler ERD:[id number] DOB: 11-Mar-1934 DOA: 10/01/2021  PCP: Patient, No Pcp Per (Inactive)  Admit date: 10/01/2021 Discharge date: 10/03/2021  Time spent: 60 minutes  Recommendations for Outpatient Follow-up:  Follow-up PCP in 2 weeks Follow-up with [facility] in 1 month Home health PT/OT will be set up   Discharge Diagnoses:  Active Problems:   Essential hypertension   DM (diabetes mellitus) (HCC)   Dyslipidemia   Stroke (cerebrum) [facility]   Discharge Condition: Stable  Diet recommendation: Heart healthy diet  Filed Weights   10/01/21 1930 10/02/21 1518  Weight: 93 kg 79.3 kg    History of present illness:  87 y.o. male, with past medical history of hypertension, diabetes mellitus, patient presents with multiple complaints, including tingling, numbness, for prolonged periods of time, but he does report unsteady gait yesterday, as well has some impaired speech earlier today, wife at bedside assists with the history, patient reportedly had unsteady gait yesterday, this morning had trouble moving both of his hands, reports tingling and numbness in all extremities, but reports this has been chronic, as well this morning he has been less verbal, later in the day he is verbal but with very slow speech, prompted wife to bring him to ED, he denies any focal deficits, loss of consciousness or altered mental status.  In ED work-up significant for significant creatinine 0.76, white blood cell of 3.1, platelet at 136, MRI brain significant for left thalamic infarct,  Hospital Course:   Acute left thalamic infarct -Patient was seen by neurology -MRI showed left thalamic infarct -TTE showed EF 55 to 60%, no IAS/shunts visualized -Neurology recommends to continue aspirin 325 mg daily along with Plavix 75 mg daily for 3 months and then stop taking Plavix and continue with aspirin alone  Hypertension -Patient not taking losartan or  lisinopril at home -We will discontinue the medications -Start taking hydralazine 25 mg p.o. twice daily -Continue Catapres 0.1 mg daily  Borderline B12 deficiency -Vitamin B12 level is 193 -We will start oral B12 supplementation -Vitamin B12 1000 mcg daily  Peripheral sensory neuropathy -B6 level is pending -Patient to follow-up [facility] as outpatient  Procedures: Echocardiogram  Consultations: Neurology  Discharge Exam: Vitals:   10/03/21 0555 10/03/21 0959  BP: 135/80 (!) 149/85  Pulse:  70  Resp: 17 18  Temp: 98.2 F (36.8 C) 98.6 F (37 C)  SpO2: 98% 99%    General: Appears in no acute distress Cardiovascular: S1-S2, regular, no murmur auscultated Respiratory: Clear to auscultation bilaterally  Discharge Instructions   Discharge Instructions     Ambulatory referral to Neurology   Complete by: As directed    Follow up with stroke clinic NP (Jessica Vanschaick or Darrol Angel, if both not available, consider Manson Allan, or Ahern) at [facility] in about 4 weeks. Thanks.   Diet - low sodium heart healthy   Complete by: As directed    Increase activity slowly   Complete by: As directed       Allergies as of 10/03/2021       Reactions   Lisinopril Swelling   Lip swelling   Amlodipine    Penicillins    Has patient had a PCN reaction causing immediate rash, facial/tongue/throat swelling, SOB or lightheadedness with hypotension: No Has patient had a PCN reaction causing severe rash involving mucus membranes or skin necrosis: No Has patient had a PCN reaction that required hospitalization No Has patient had a PCN reaction occurring within  the last 10 years: No "Locked my knee up" If all of the above answers are "NO", then may proceed with Cephalosporin use.        Medication List     STOP taking these medications    lisinopril 40 MG tablet Commonly known as: ZESTRIL   losartan 100 MG tablet Commonly known as: COZAAR    meclizine 25 MG tablet Commonly known as: ANTIVERT   ondansetron 4 MG tablet Commonly known as: ZOFRAN       TAKE these medications    aspirin 325 MG tablet Take 1 tablet (325 mg total) by mouth daily. Start taking on: October 04, 2021   atorvastatin 40 MG tablet Commonly known as: LIPITOR Take 1 tablet (40 mg total) by mouth every evening.   cetirizine 10 MG tablet Commonly known as: ZYRTEC Take 10 mg by mouth daily as needed.   cholecalciferol 1000 units tablet Commonly known as: VITAMIN D Take 1,000 Units by mouth daily.   cloNIDine 0.1 MG tablet Commonly known as: CATAPRES Take 0.1 mg by mouth daily.   clopidogrel 75 MG tablet Commonly known as: PLAVIX Take 1 tablet (75 mg total) by mouth daily. Stop after 90 days, then continue taking Aspirin alone Start taking on: October 04, 2021   hydrALAZINE 25 MG tablet Commonly known as: APRESOLINE Take 1 tablet (25 mg total) by mouth in the morning and at bedtime.   metFORMIN 500 MG 24 hr tablet Commonly known as: GLUCOPHAGE-XR Take 500 mg by mouth daily. What changed: Another medication with the same name was removed. Continue taking this medication, and follow the directions you see here.   OVER THE COUNTER MEDICATION Take 1 tablet by mouth daily. Curemed   QC TUMERIC COMPLEX PO Take 1 tablet by mouth daily.   thiamine 100 MG tablet Commonly known as: Vitamin B-1 Take 100 mg by mouth daily.   vitamin B-12 1000 MCG tablet Commonly known as: CYANOCOBALAMIN Take 1 tablet (1,000 mcg total) by mouth daily. What changed:  medication strength how much to take   Vitamin D3 Liqd 1 drop by Does not apply route daily.   zinc gluconate 50 MG tablet Take 1 tablet by mouth daily.       Allergies  Allergen Reactions   Lisinopril Swelling    Lip swelling   Amlodipine    Penicillins     Has patient had a PCN reaction causing immediate rash, facial/tongue/throat swelling, SOB or lightheadedness with  hypotension: No Has patient had a PCN reaction causing severe rash involving mucus membranes or skin necrosis: No Has patient had a PCN reaction that required hospitalization No Has patient had a PCN reaction occurring within the last 10 years: No  "Locked my knee up" If all of the above answers are "NO", then may proceed with Cephalosporin use.     Follow-up Information     [facility]. Schedule an appointment as soon as possible for a visit in 1 month(s).   Specialty: Neurology Why: stroke clinic Contact information: [address] [place] [address] [phone]                 The results of significant diagnostics from this hospitalization (including imaging, microbiology, ancillary and laboratory) are listed below for reference.    Significant Diagnostic Studies: CT ANGIO HEAD NECK W WO CM  Result Date: 10/01/2021 CLINICAL DATA:  Extremity numbness. EXAM: CT ANGIOGRAPHY HEAD AND NECK TECHNIQUE: Multidetector CT imaging of the head and  neck was performed using the standard protocol during bolus administration of intravenous contrast. Multiplanar CT image reconstructions and MIPs were obtained to evaluate the vascular anatomy. Carotid stenosis measurements (when applicable) are obtained utilizing NASCET criteria, using the distal internal carotid diameter as the denominator. CONTRAST:  OMNIPAQUE IOHEXOL 350 MG/ML SOLN COMPARISON:  None. FINDINGS: CTA NECK FINDINGS SKELETON: There is no bony spinal canal stenosis. No lytic or blastic lesion. OTHER NECK: Normal pharynx, larynx and major salivary glands. No cervical lymphadenopathy. Unremarkable thyroid gland. UPPER CHEST: No pneumothorax or pleural effusion. No nodules or masses. AORTIC ARCH: There is calcific atherosclerosis of the aortic arch. There is no aneurysm, dissection or hemodynamically significant stenosis of the visualized portion of the aorta. Conventional 3 vessel  aortic branching pattern. The visualized proximal subclavian arteries are widely patent. RIGHT CAROTID SYSTEM: No dissection, occlusion or aneurysm. Mild atherosclerotic calcification at the carotid bifurcation without hemodynamically significant stenosis. LEFT CAROTID SYSTEM: Normal without aneurysm, dissection or stenosis. VERTEBRAL ARTERIES: Codominant configuration. Both origins are clearly patent. There is no dissection, occlusion or flow-limiting stenosis to the skull base (V1-V3 segments). CTA HEAD FINDINGS POSTERIOR CIRCULATION: --Vertebral arteries: Normal V4 segments. --Inferior cerebellar arteries: Normal. --Basilar artery: Normal. --Superior cerebellar arteries: Normal. --Posterior cerebral arteries (PCA): Normal. ANTERIOR CIRCULATION: --Intracranial internal carotid arteries: Normal. --Anterior cerebral arteries (ACA): Normal. Both A1 segments are present. Patent anterior communicating artery (a-comm). --Middle cerebral arteries (MCA): Normal. VENOUS SINUSES: As permitted by contrast timing, patent. ANATOMIC VARIANTS: Fetal origins of both posterior cerebral arteries. Review of the MIP images confirms the above findings. IMPRESSION: No emergent large vessel occlusion or high-grade stenosis of the intracranial or cervical arteries. Aortic atherosclerosis (ICD10-I70.0). Electronically Signed   By: Deatra Robinson M.D.   On: 10/01/2021 23:03   CT HEAD WO CONTRAST  Result Date: 10/01/2021 CLINICAL DATA:  Mental status change, unknown cause. Additional history provided: Numbness in extremities, history of diabetes mellitus, hypertension. EXAM: CT HEAD WITHOUT CONTRAST TECHNIQUE: Contiguous axial images were obtained from the base of the skull through the vertex without intravenous contrast. COMPARISON:  Noncontrast head CT and CT perfusion head 01/04/2020. FINDINGS: Brain: Mild generalized cerebral atrophy. Chronic small-vessel infarcts within the right corona radiata/basal ganglia, progressed in number  as compared to the prior head CT of 01/04/2020. Background mild patchy and ill-defined hypoattenuation within the cerebral white matter, nonspecific but compatible chronic small vessel ischemic disease. Redemonstrated chronic lacunar infarct within the medial right cerebellar hemisphere (series 2, image 11). There is no acute intracranial hemorrhage. No demarcated cortical infarct. No extra-axial fluid collection. No evidence of an intracranial mass. No midline shift. Vascular: No hyperdense vessel.  Atherosclerotic calcifications. Skull: Normal. Negative for fracture or focal lesion. Sinuses/Orbits: Visualized orbits show no acute finding. Trace mucosal thickening within the bilateral ethmoid air cells. Small-volume fluid within a posterior left ethmoid air cell. IMPRESSION: No evidence of acute intracranial abnormality. Chronic small-vessel infarcts within the right corona radiata/basal ganglia, progressed in number as compared to the head CT of 01/04/2020. Background mild chronic small vessel ischemic changes within the cerebral white matter. Redemonstrated chronic lacunar infarct within the right cerebellar hemisphere. Mild generalized cerebral atrophy. Mild paranasal sinus disease, as described. Electronically Signed   By: Jackey Loge D.O.   On: 10/01/2021 13:24   MR ANGIO HEAD WO CONTRAST  Result Date: 10/01/2021 CLINICAL DATA:  Neuro deficit, acute, stroke suspected EXAM: MRI HEAD WITHOUT CONTRAST MRA HEAD WITHOUT CONTRAST TECHNIQUE: Multiplanar, multi-echo pulse sequences of the brain and surrounding structures were  acquired without intravenous contrast. Angiographic images of the Circle of Willis were acquired using MRA technique without intravenous contrast. COMPARISON:  No pertinent prior exam. FINDINGS: MRI HEAD Brain: There is a 8 mm focus of reduced diffusion within the left thalamus. Chronic infarcts of the right lentiform nucleus and corona radiata with some associated chronic blood products.  Small chronic right precentral gyrus cortical infarct. Small chronic right cerebellar infarct. Additional patchy T2 hyperintensity in the supratentorial white matter is nonspecific but may reflect mild chronic microvascular ischemic changes. Prominence of the ventricles and sulci reflects mild parenchymal volume loss. Vascular: Major vessel flow voids at the skull base are preserved. Skull and upper cervical spine: Normal marrow signal is preserved. Sinuses/Orbits: Paranasal sinuses are aerated. Orbits are unremarkable. Other: Sella is unremarkable.  Mastoid air cells are clear. MRA HEAD Intracranial internal carotid arteries are patent. Middle and anterior cerebral arteries are patent. Mild atherosclerotic irregularity distal MCA branches. Focal high-grade stenosis distal right A2 ACA. Intracranial vertebral arteries, basilar artery, posterior cerebral arteries are patent. Bilateral posterior communicating arteries are present with fetal origin of the posterior cerebral arteries. Focal high-grade stenosis left P2 PCA. No aneurysm. IMPRESSION: Acute left thalamic infarct. Chronic infarcts and chronic microvascular ischemic changes. No large vessel occlusion. Focal high-grade stenoses distal right A2 ACA and left P2 PCA. Electronically Signed   By: Guadlupe Spanish M.D.   On: 10/01/2021 16:19   MR BRAIN WO CONTRAST  Result Date: 10/01/2021 CLINICAL DATA:  Neuro deficit, acute, stroke suspected EXAM: MRI HEAD WITHOUT CONTRAST MRA HEAD WITHOUT CONTRAST TECHNIQUE: Multiplanar, multi-echo pulse sequences of the brain and surrounding structures were acquired without intravenous contrast. Angiographic images of the Circle of Willis were acquired using MRA technique without intravenous contrast. COMPARISON:  No pertinent prior exam. FINDINGS: MRI HEAD Brain: There is a 8 mm focus of reduced diffusion within the left thalamus. Chronic infarcts of the right lentiform nucleus and corona radiata with some associated chronic  blood products. Small chronic right precentral gyrus cortical infarct. Small chronic right cerebellar infarct. Additional patchy T2 hyperintensity in the supratentorial white matter is nonspecific but may reflect mild chronic microvascular ischemic changes. Prominence of the ventricles and sulci reflects mild parenchymal volume loss. Vascular: Major vessel flow voids at the skull base are preserved. Skull and upper cervical spine: Normal marrow signal is preserved. Sinuses/Orbits: Paranasal sinuses are aerated. Orbits are unremarkable. Other: Sella is unremarkable.  Mastoid air cells are clear. MRA HEAD Intracranial internal carotid arteries are patent. Middle and anterior cerebral arteries are patent. Mild atherosclerotic irregularity distal MCA branches. Focal high-grade stenosis distal right A2 ACA. Intracranial vertebral arteries, basilar artery, posterior cerebral arteries are patent. Bilateral posterior communicating arteries are present with fetal origin of the posterior cerebral arteries. Focal high-grade stenosis left P2 PCA. No aneurysm. IMPRESSION: Acute left thalamic infarct. Chronic infarcts and chronic microvascular ischemic changes. No large vessel occlusion. Focal high-grade stenoses distal right A2 ACA and left P2 PCA. Electronically Signed   By: Guadlupe Spanish M.D.   On: 10/01/2021 16:19   ECHOCARDIOGRAM COMPLETE  Result Date: 10/02/2021    ECHOCARDIOGRAM REPORT   Patient Name:   Dustin Chandler Date of Exam: 10/02/2021 Medical Rec #:  [id number]          Height:       73.0 in Accession #:    [id number]         Weight:       205.0 lb Date of Birth:  [birth year]  BSA:          2.174 m Patient Age:    87 years           BP:           172/90 mmHg Patient Gender: M                  HR:           57 bpm. [facility] Procedure: 2D Echo, Cardiac Doppler and Color Doppler Indications:    Stroke  History:        Patient has no prior history of Echocardiogram examinations.                  Stroke; Risk Factors:Hypertension, Diabetes and Dyslipidemia.  Sonographer:    Mikki Harbor Referring Phys: 2 DAWOOD S ELGERGAWY IMPRESSIONS  1. Left ventricular ejection fraction, by estimation, is 55 to 60%. The left ventricle has normal function. The left ventricle has no regional wall motion abnormalities. There is mild left ventricular hypertrophy. Left ventricular diastolic parameters are consistent with Grade I diastolic dysfunction (impaired relaxation).  2. Right ventricular systolic function is normal. The right ventricular size is normal. Tricuspid regurgitation signal is inadequate for assessing PA pressure.  3. The mitral valve is normal in structure. Trivial mitral valve regurgitation. No evidence of mitral stenosis.  4. The aortic valve is grossly normal. Aortic valve regurgitation is not visualized. No aortic stenosis is present. Conclusion(s)/Recommendation(s): No intracardiac source of embolism detected on this transthoracic study. FINDINGS  Left Ventricle: Left ventricular ejection fraction, by estimation, is 55 to 60%. The left ventricle has normal function. The left ventricle has no regional wall motion abnormalities. The left ventricular internal cavity size was normal in size. There is  mild left ventricular hypertrophy. Left ventricular diastolic parameters are consistent with Grade I diastolic dysfunction (impaired relaxation). Right Ventricle: The right ventricular size is normal. No increase in right ventricular wall thickness. Right ventricular systolic function is normal. Tricuspid regurgitation signal is inadequate for assessing PA pressure. Left Atrium: Left atrial size was normal in size. Right Atrium: Right atrial size was normal in size. Pericardium: There is no evidence of pericardial effusion. Mitral Valve: The mitral valve is normal in structure. Trivial mitral valve regurgitation. No evidence of mitral valve stenosis. MV peak gradient, 2.8 mmHg. The mean mitral  valve gradient is 1.0 mmHg. Tricuspid Valve: The tricuspid valve is normal in structure. Tricuspid valve regurgitation is trivial. No evidence of tricuspid stenosis. Aortic Valve: The aortic valve is grossly normal. Aortic valve regurgitation is not visualized. No aortic stenosis is present. Aortic valve mean gradient measures 2.0 mmHg. Aortic valve peak gradient measures 4.3 mmHg. Aortic valve area, by VTI measures 2.47 cm. Pulmonic Valve: The pulmonic valve was not well visualized. Pulmonic valve regurgitation is trivial. No evidence of pulmonic stenosis. Aorta: The aortic root is normal in size and structure. Venous: The inferior vena cava was not well visualized. IAS/Shunts: The interatrial septum was not well visualized.  LEFT VENTRICLE PLAX 2D LVIDd:         4.45 cm     Diastology LVIDs:         3.05 cm     LV e' medial:    6.96 cm/s LV PW:         1.10 cm     LV E/e' medial:  8.6 LV IVS:        1.10 cm     LV e'  lateral:   6.96 cm/s LVOT diam:     2.00 cm     LV E/e' lateral: 8.6 LV SV:         59 LV SV Index:   27 LVOT Area:     3.14 cm  LV Volumes (MOD) LV vol d, MOD A2C: 68.5 ml LV vol d, MOD A4C: 84.4 ml LV vol s, MOD A2C: 34.2 ml LV vol s, MOD A4C: 37.6 ml LV SV MOD A2C:     34.3 ml LV SV MOD A4C:     84.4 ml LV SV MOD BP:      43.1 ml RIGHT VENTRICLE RV Basal diam:  4.00 cm RV Mid diam:    2.50 cm RV S prime:     10.10 cm/s TAPSE (M-mode): 2.3 cm LEFT ATRIUM             Index        RIGHT ATRIUM           Index LA diam:        3.50 cm 1.61 cm/m   RA Area:     19.60 cm LA Vol (A2C):   85.7 ml 39.41 ml/m  RA Volume:   58.70 ml  27.00 ml/m LA Vol (A4C):   43.7 ml 20.10 ml/m LA Biplane Vol: 62.7 ml 28.83 ml/m  AORTIC VALVE AV Area (Vmax):    2.07 cm AV Area (Vmean):   1.77 cm AV Area (VTI):     2.47 cm AV Vmax:           104.00 cm/s AV Vmean:          69.100 cm/s AV VTI:            0.240 m AV Peak Grad:      4.3 mmHg AV Mean Grad:      2.0 mmHg LVOT Vmax:         68.50 cm/s LVOT Vmean:         39.000 cm/s LVOT VTI:          0.189 m LVOT/AV VTI ratio: 0.79  AORTA Ao Root diam: 3.80 cm MITRAL VALVE MV Area (PHT): 2.50 cm    SHUNTS MV Area VTI:   2.17 cm    Systemic VTI:  0.19 m MV Peak grad:  2.8 mmHg    Systemic Diam: 2.00 cm MV Mean grad:  1.0 mmHg MV Vmax:       0.84 m/s MV Vmean:      39.3 cm/s MV Decel Time: 304 msec MV E velocity: 59.60 cm/s MV A velocity: 65.60 cm/s MV E/A ratio:  0.91 Weston Brass MD Electronically signed by Weston Brass MD Signature Date/Time: 10/02/2021/12:15:42 PM    Final     Microbiology: Recent Results (from the past 240 hour(s))  Resp Panel by RT-PCR (Flu A&B, Covid) Nasopharyngeal Swab     Status: None   Collection Time: 10/01/21  1:09 PM   Specimen: Nasopharyngeal Swab; Nasopharyngeal(NP) swabs in vial transport medium  Result Value Ref Range Status   SARS Coronavirus 2 by RT PCR NEGATIVE NEGATIVE Final    Comment: (NOTE) SARS-CoV-2 target nucleic acids are NOT DETECTED.  The SARS-CoV-2 RNA is generally detectable in upper respiratory specimens during the acute phase of infection. The lowest concentration of SARS-CoV-2 viral copies this assay can detect is 138 copies/mL. A negative result does not preclude SARS-Cov-2 infection and should not be used as the sole basis for treatment or other patient management decisions.  A negative result may occur with  improper specimen collection/handling, submission of specimen other than nasopharyngeal swab, presence of viral mutation(s) within the areas targeted by this assay, and inadequate number of viral copies(<138 copies/mL). A negative result must be combined with clinical observations, patient history, and epidemiological information. The expected result is Negative.  Fact Sheet for Patients:  BloggerCourse.com  Fact Sheet for Healthcare Providers:  SeriousBroker.it  This test is no t yet approved or cleared by the [place] FDA and  has  been authorized for detection and/or diagnosis of SARS-CoV-2 by FDA under an Emergency Use Authorization (EUA). This EUA will remain  in effect (meaning this test can be used) for the duration of the COVID-19 declaration under Section 564(b)(1) of the Act, 21 U.S.C.section 360bbb-3(b)(1), unless the authorization is terminated  or revoked sooner.       Influenza A by PCR NEGATIVE NEGATIVE Final   Influenza B by PCR NEGATIVE NEGATIVE Final    Comment: (NOTE) The Xpert Xpress SARS-CoV-2/FLU/RSV plus assay is intended as an aid in the diagnosis of influenza from Nasopharyngeal swab specimens and should not be used as a sole basis for treatment. Nasal washings and aspirates are unacceptable for Xpert Xpress SARS-CoV-2/FLU/RSV testing.  Fact Sheet for Patients: BloggerCourse.com  Fact Sheet for Healthcare Providers: SeriousBroker.it  This test is not yet approved or cleared by the [place] FDA and has been authorized for detection and/or diagnosis of SARS-CoV-2 by FDA under an Emergency Use Authorization (EUA). This EUA will remain in effect (meaning this test can be used) for the duration of the COVID-19 declaration under Section 564(b)(1) of the Act, 21 U.S.C. section 360bbb-3(b)(1), unless the authorization is terminated or revoked.  Performed at [facility], [address], [place], [place] [address]      Labs: Basic Metabolic Panel: Recent Labs  Lab 10/01/21 1239 10/02/21 0602  NA 140 140  K 4.5 3.8  CL 106 108  CO2 29 26  GLUCOSE 104* 103*  BUN 18 20  CREATININE 0.76 0.73  CALCIUM 10.0 10.1   Liver Function Tests: Recent Labs  Lab 10/01/21 1239 10/02/21 0602  AST 17 14*  ALT 11 11  ALKPHOS 58 50  BILITOT 0.9 0.7  PROT 7.7 6.6  ALBUMIN 4.3 3.6   CBC: Recent Labs  Lab 10/01/21 1239 10/02/21 0602  WBC 3.1* 3.8*  NEUTROABS 1.6*  --   HGB 14.2 12.9*  HCT 44.1 40.4  MCV 91.9 91.8  PLT 136* 135*      CBG: Recent Labs  Lab 10/02/21 1229 10/02/21 1515 10/02/21 2101 10/03/21 0747 10/03/21 1144  GLUCAP 96 83 122* 109* 134*       Signed:  Meredeth Ide MD.  [facility] 10/03/2021, 3:40 PM

## 2021-10-03 NOTE — Progress Notes (Addendum)
STROKE TEAM PROGRESS NOTE   ATTENDING NOTE: I reviewed above note and agree with the assessment and plan. Pt was seen and examined.   87 year old male with history hypertension, diabetes admitted for right hand numbness and weakness.  CT no acute abnormality, old right CR/BG infarct.  MRI showed acute left thalamic infarct.  MRA head and CTA head and neck showed right A2 and left P2 high-grade stenosis.  EF 55 to 60%, LDL 91, A1c 6.0.  UDS negative.  Creatinine 0.73, WBC 3.8.  On exam, patient awake alert, orientated x3, no aphasia, mild dysarthria due to poor denture.  Follows simple commands, able to name and repeat.  No gaze palsy, visual field fall, facial symmetrical.  Lateral upper and lower extremity muscle strength symmetrical except right hand grip 3+/5.  Sensation bilateral symmetrical although subjective right hand tingling.  Finger-to-nose intact bilaterally.    Etiology for patient stroke likely large vessel disease due to left P2 high-grade stenosis.  Recommend aspirin 325 and Plavix 75 DAPT for 3 months and then aspirin alone.  Continue Lipitor 40.  Stroke risk factor modification.  PT/OT recommend home health PT/OT.  For detailed assessment and plan, please refer to above as I have made changes wherever appropriate.   Neurology will sign off. Please call with questions. Pt will follow up with stroke clinic NP at [facility] in about 4 weeks. Thanks for the consult.   Marvel Plan, MD PhD Stroke Neurology 10/03/2021 7:09 PM    INTERVAL HISTORY No acute events since arrival.  No visitors at bedside. Today patient reports he feels fine except the numbness in his right hand which persists and is unchanged. He does feel like his grip is still weak and his coordination is a bit impaired in that hand as well. His chronic longstanding bilat LE foot neuropathy is unchanged.  We discussed his stroke diagnosis, work up and plan of care. He had no questions.   Vitals:   10/02/21 1400 10/02/21  1518 10/02/21 2104 10/03/21 0555  BP: (!) 190/99 (!) 199/94 (!) 173/95 135/80  Pulse: (!) 58 (!) 55    Resp: 16  12 17   Temp:  98.6 F (37 C) 98.8 F (37.1 C) 98.2 F (36.8 C)  TempSrc:  Oral Oral Oral  SpO2: 100% 100% 99% 98%  Weight:  79.3 kg    Height:  6\' 1"  (1.854 m)     CBC:  Recent Labs  Lab 10/01/21 1239 10/02/21 0602  WBC 3.1* 3.8*  NEUTROABS 1.6*  --   HGB 14.2 12.9*  HCT 44.1 40.4  MCV 91.9 91.8  PLT 136* 135*   Basic Metabolic Panel:  Recent Labs  Lab 10/01/21 1239 10/02/21 0602  NA 140 140  K 4.5 3.8  CL 106 108  CO2 29 26  GLUCOSE 104* 103*  BUN 18 20  CREATININE 0.76 0.73  CALCIUM 10.0 10.1   Lipid Panel:  Recent Labs  Lab 10/02/21 0601  CHOL 142  TRIG 42  HDL 43  CHOLHDL 3.3  VLDL 8  LDLCALC 91   HgbA1c:  Recent Labs  Lab 10/02/21 0601  HGBA1C 6.0*   Urine Drug Screen:  Recent Labs  Lab 10/01/21 1451  LABOPIA NONE DETECTED  COCAINSCRNUR NONE DETECTED  LABBENZ NONE DETECTED  AMPHETMU NONE DETECTED  THCU NONE DETECTED  LABBARB NONE DETECTED    Alcohol Level  Recent Labs  Lab 10/01/21 1239  ETH <10   IMAGING past 24 hours No results found.  PHYSICAL EXAM  Temp:  [98.2 F (36.8 C)-98.8 F (37.1 C)] 98.6 F (37 C) (10/16 0959) Pulse Rate:  [70] 70 (10/16 0959) Resp:  [12-18] 18 (10/16 0959) BP: (135-173)/(80-95) 149/85 (10/16 0959) SpO2:  [98 %-99 %] 99 % (10/16 0959)  General - Well nourished, well developed, in no apparent distress.  Ophthalmologic - fundi not visualized due to noncooperation.  Cardiovascular - Regular rhythm and rate.  Mental Status -  Level of arousal and orientation to time, place, and person were intact. Language including expression, naming, repetition, comprehension was assessed and found intact. Attention span and concentratio were normal. Recent and remote memory were intact. Fund of Knowledge was assessed and was intact.  Cranial Nerves II - XII - II - Visual field intact  OU. III, IV, VI - Extraocular movements intact. V - Facial sensation intact bilaterally. VII - Facial movement intact bilaterally. VIII - Hearing & vestibular intact bilaterally. X - Palate elevates symmetrically. XI - Chin turning & shoulder shrug intact bilaterally. XII - Tongue protrusion intact.  Motor Strength - The patient's strength was impaired with right grip 4+/5 otherwise 5/5  in all extremities and pronator drift was absent.  Bulk was normal and fasciculations were absent Motor Tone - Muscle tone was assessed at the neck and appendages and was normal  Sensory - Decreased sensation right hand in glove like distribution. Other wise SILT x 4 extremities   Coordination - The patient had normal movements in the hands and feet with no ataxia or dysmetria.  Tremor was absent.  Gait and Station - deferred.   ASSESSMENT/PLAN Dustin Chandler is a 87 y.o. male with PMH significant for HTN, DM2, diverticulosis who presents with numbness in R hand and weakness in R hand. Unclear when the symptoms exactly started but feels they started on Wednesday and he woke up on the 14th with worsening symptoms. Workup with MRI demonstrated a left thalamic stroke. Also endorses long standing hx of BL lower extremity neuropathy. Does not smoke an dquit in 1999, no EtOh intake. Reports long standing diabetes and trouble with glucose control. LKW: 09/29/21, weakness worsened on 10/01/21. mRS: 1 tNK/thrombectomy: outside window  Left thalamic ischemic stroke likely due to SVD  Code Stroke  No evidence of acute intracranial abnormality. Chronic small-vessel infarcts within the right corona radiata/basal ganglia, progressed in number as compared to the head CT of 01/04/2020. Background mild chronic small vessel ischemic changes within the cerebral white matter. Redemonstrated chronic lacunar infarct within the right cerebellar hemisphere. Mild generalized cerebral atrophy. Mild paranasal sinus  disease MRI/MRA  Acute left thalamic infarct. Chronic infarcts and chronic microvascular ischemic changes. No large vessel occlusion. Focal high-grade stenoses distal right A2 ACA and left P2 PCA. 2D Echo  1. Left ventricular ejection fraction, by estimation, is 55 to 60%. The left ventricle has normal function. The left ventricle has no regional wall motion abnormalities. There is mild left ventricular hypertrophy.  Left ventricular diastolic parameters  are consistent with Grade I diastolic dysfunction (impaired relaxation).   2. Right ventricular systolic function is normal. The right ventricular  size is normal. Tricuspid regurgitation signal is inadequate for assessing PA pressure.   3. The mitral valve is normal in structure. Trivial mitral valve  regurgitation. No evidence of mitral stenosis.   4. The aortic valve is grossly normal. Aortic valve regurgitation is not visualized. No aortic stenosis is present.  LDL 91 HgbA1c 6.0 VTE prophylaxis - Recommended     Diet   Diet heart healthy/carb  modified Room service appropriate? Yes; Fluid consistency: Thin  No AC/AP prior to admission  Recommend ASA 325mg   and Plavix 75 daily for 3 months then ASA alone as monotherapy in the setting of high grade ICS Therapy recommendations:  Cleared  Disposition:  Home  Follow up GNS 4 weeks with Dr.       ICS Focal high-grade stenoses distal right A2 ACA and left P2 PCA.  Hypertension Stable  Permissive hypertension (OK if < 220/120) but gradually normalize in 5-7 days Long-term BP goal normotensive  Hyperlipidemia Home meds:  None  LDL 91, goal < 70 High intensity statin: Lipitor 40mg   Continue statin at discharge       Diabetes type II Controlled HgbA1c 6.0, goal < 7.0 CBGs Recent Labs    10/02/21 1515 10/02/21 2101 10/03/21 0747  GLUCAP 83 122* 109*    Management per primary team         Length dependent sensory neuropathy: Vit B12  193 with MMA PENDING TSH wnl,  Folate wnl, Vit B6 PENDING, SPEP PENDING.  follow with neurology outpatient.   Other Stroke Risk Factors Advanced Age >/= 77  Former Cigarette smoker Family hx stroke (Sister)  Other Active Problems   Hospital day # 2 Dr. 10/05/21 directed the plan of care.  78, NP-C   To contact Stroke Continuity provider, please refer to Roda Shutters. After hours, contact General Neurology

## 2021-10-03 NOTE — TOC Transition Note (Signed)
Transition of Care [facility]) - CM/SW Discharge Note   Patient Details  Name: Dustin Chandler MRN: [id number] Date of Birth: [birth year]  [facility] CM/SW Contact:  Norvel Richards, RN Phone Number: 10/03/2021, 4:18 PM   Clinical Narrative:  [facility] team for discharge planning. Spoke to pt via phone to discuss plan for  home health PT. Pt agreeable with the plan. Arranged services with [facility]. Spoke to spouse about service provider. She voices agreement with service provider. She shares that patient has great family support- 3 daughters are at bedside along with spouse. No further needs identified at this time.    Final next level of care: Home w Home Health Services     Patient Goals and CMS Choice Patient states their goals for this hospitalization and ongoing recovery are:: Go home with daughters.      Discharge Placement                       Discharge Plan and Services                          HH Arranged: PT [facility] Agency: [facility] Date [facility] Agency Contacted: 10/03/21 Time HH Agency Contacted: [phone] Representative spoke with at [facility] Agency: Geoffery Spruce- [phone]  Social Determinants of Health (SDOH) Interventions     Readmission Risk Interventions No flowsheet data found.

## 2021-10-05 LAB — PROTEIN ELECTROPHORESIS, SERUM
A/G Ratio: 1.4 (ref 0.7–1.7)
Albumin ELP: 3.6 g/dL (ref 2.9–4.4)
Alpha-1-Globulin: 0.2 g/dL (ref 0.0–0.4)
Alpha-2-Globulin: 0.6 g/dL (ref 0.4–1.0)
Beta Globulin: 0.9 g/dL (ref 0.7–1.3)
Gamma Globulin: 0.9 g/dL (ref 0.4–1.8)
Globulin, Total: 2.6 g/dL (ref 2.2–3.9)
Total Protein ELP: 6.2 g/dL (ref 6.0–8.5)

## 2021-10-05 LAB — METHYLMALONIC ACID, SERUM: Methylmalonic Acid, Quantitative: 334 nmol/L (ref 0–378)

## 2021-10-06 LAB — VITAMIN B6: Vitamin B6: 8.6 ug/L (ref 3.4–65.2)

## 2021-11-10 ENCOUNTER — Ambulatory Visit (INDEPENDENT_AMBULATORY_CARE_PROVIDER_SITE_OTHER): Payer: Medicare Other | Admitting: Adult Health

## 2021-11-10 ENCOUNTER — Encounter: Payer: Self-pay | Admitting: Adult Health

## 2021-11-10 VITALS — BP 164/94 | HR 79 | Ht 70.0 in | Wt 179.0 lb

## 2021-11-10 DIAGNOSIS — E119 Type 2 diabetes mellitus without complications: Secondary | ICD-10-CM | POA: Diagnosis not present

## 2021-11-10 DIAGNOSIS — I10 Essential (primary) hypertension: Secondary | ICD-10-CM

## 2021-11-10 DIAGNOSIS — E785 Hyperlipidemia, unspecified: Secondary | ICD-10-CM

## 2021-11-10 DIAGNOSIS — I6381 Other cerebral infarction due to occlusion or stenosis of small artery: Secondary | ICD-10-CM

## 2021-11-10 NOTE — Patient Instructions (Addendum)
Continue working with therapies for hopeful ongoing recovery  Continue aspirin 325 mg daily and clopidogrel 75 mg daily  and atorvastatin  for secondary stroke prevention  Continue plavix til mid January then stop  Would recommend completed barium swallowing study for swallowing concerns which [facility]  Continue to follow up with PCP regarding cholesterol, blood pressure and diabetes management  Maintain strict control of hypertension with blood pressure goal below 130/90, diabetes with hemoglobin A1c goal below 7.0% and cholesterol with LDL cholesterol (bad cholesterol) goal below 70 mg/dL.      Follow up as needed as you will be followed by neurology through the [facility]      Thank you for coming to see [place] at [facility]. I hope we have been able to provide you high quality care today.  You may receive a patient satisfaction survey over the next few weeks. We would appreciate your feedback and comments so that we may continue to improve ourselves and the health of our patients.   Stroke Prevention Some medical conditions and lifestyle choices can lead to a higher risk for a stroke. You can help to prevent a stroke by eating healthy foods and exercising. It also helps to not smoke and to manage any health problems you may have. How can this condition affect me? A stroke is an emergency. It should be treated right away. A stroke can lead to brain damage or threaten your life. There is a better chance of surviving and getting better after a stroke if you get medical help right away. What can increase my risk? The following medical conditions may increase your risk of a stroke: Diseases of the heart and blood vessels (cardiovascular disease). High blood pressure (hypertension). Diabetes. High cholesterol. Sickle cell disease. Problems with blood clotting. Being very overweight. Sleeping problems (obstructivesleep apnea). Other risk factors  include: Being older than age 8. A history of blood clots, stroke, or mini-stroke (TIA). Race, ethnic background, or a family history of stroke. Smoking or using tobacco products. Taking birth control pills, especially if you smoke. Heavy alcohol and drug use. Not being active. What actions can I take to prevent this? Manage your health conditions High cholesterol. Eat a healthy diet. If this is not enough to manage your cholesterol, you may need to take medicines. Take medicines as told by your doctor. High blood pressure. Try to keep your blood pressure below 130/80. If your blood pressure cannot be managed through a healthy diet and regular exercise, you may need to take medicines. Take medicines as told by your doctor. Ask your doctor if you should check your blood pressure at home. Have your blood pressure checked every year. Diabetes. Eat a healthy diet and get regular exercise. If your blood sugar (glucose) cannot be managed through diet and exercise, you may need to take medicines. Take medicines as told by your doctor. Talk to your doctor about getting checked for sleeping problems. Signs of a problem can include: Snoring a lot. Feeling very tired. Make sure that you manage any other conditions you have. Nutrition  Follow instructions from your doctor about what to eat or drink. You may be told to: Eat and drink fewer calories each day. Limit how much salt (sodium) you use to 1,500 milligrams (mg) each day. Use only healthy fats for cooking, such as olive oil, canola oil, and sunflower oil. Eat healthy foods. To do this: Choose foods that are high in fiber. These include  whole grains, and fresh fruits and vegetables. Eat at least 5 servings of fruits and vegetables a day. Try to fill one-half of your plate with fruits and vegetables at each meal. Choose low-fat (lean) proteins. These include low-fat cuts of meat, chicken without skin, fish, tofu, beans, and nuts. Eat  low-fat dairy products. Avoid foods that: Are high in salt. Have saturated fat. Have trans fat. Have cholesterol. Are processed or pre-made. Count how many carbohydrates you eat and drink each day. Lifestyle If you drink alcohol: Limit how much you have to: 0-1 drink a day for women who are not pregnant. 0-2 drinks a day for men. Know how much alcohol is in your drink. In the [place] one drink equals one 12 oz bottle of beer ( ), one 5 oz glass of wine ( ), or one 1 oz glass of hard liquor (44mL). Do not smoke or use any products that have nicotine or tobacco. If you need help quitting, ask your doctor. Avoid secondhand smoke. Do not use drugs. Activity  Try to stay at a healthy weight. Get at least 30 minutes of exercise on most days, such as: Fast walking. Biking. Swimming. Medicines Take over-the-counter and prescription medicines only as told by your doctor. Avoid taking birth control pills. Talk to your doctor about the risks of taking birth control pills if: You are over 70 years old. You smoke. You get very bad headaches. You have had a blood clot. Where to find more information American Stroke Association: www.strokeassociation.org Get help right away if: You or a loved one has any signs of a stroke. "BE FAST" is an easy way to remember the warning signs: B - Balance. Dizziness, sudden trouble walking, or loss of balance. E - Eyes. Trouble seeing or a change in how you see. F - Face. Sudden weakness or loss of feeling of the face. The face or eyelid may droop on one side. A - Arms. Weakness or loss of feeling in an arm. This happens all of a sudden and most often on one side of the body. S - Speech. Sudden trouble speaking, slurred speech, or trouble understanding what people say. T - Time. Time to call emergency services. Write down what time symptoms started. You or a loved one has other signs of a stroke, such as: A sudden, very bad headache with no known  cause. Feeling like you may vomit (nausea). Vomiting. A seizure. These symptoms may be an emergency. Get help right away. Call your local emergency services (911 in the [place] Do not wait to see if the symptoms will go away. Do not drive yourself to the hospital. Summary You can help to prevent a stroke by eating healthy, exercising, and not smoking. It also helps to manage any health problems you have. Do not smoke or use any products that contain nicotine or tobacco. Get help right away if you or a loved one has any signs of a stroke. This information is not intended to replace advice given to you by your health care provider. Make sure you discuss any questions you have with your health care provider. Document Revised: 07/06/2020 Document Reviewed: 07/06/2020 Elsevier Patient Education  2022 [employer].

## 2021-11-10 NOTE — Progress Notes (Signed)
[facility] [address] [place]. [place] [address] [phone]       HOSPITAL FOLLOW UP NOTE  [name] Date of Birth:  [birth year] Medical Record Number:  [id number]   Reason for Referral:  hospital stroke follow up    SUBJECTIVE:   CHIEF COMPLAINT:  Chief Complaint  Patient presents with   Follow-up    Rm 3 with daughter Dustin Chandler  Pt is well,  just having numbness in lip and in R hand, light headed and Occasional issues with swallowing.    HPI:   Per Dr. Erlinda Chandler "87 year old male with history hypertension, diabetes admitted for right hand numbness and weakness.  CT no acute abnormality, old right CR/BG infarct.  MRI showed acute left thalamic infarct.  MRA head and CTA head and neck showed right A2 and left P2 high-grade stenosis.  EF 55 to 60%, LDL 91, A1c 6.0.  UDS negative. Creatinine 0.73, WBC 3.8.  On exam, patient awake alert, orientated x3, no aphasia, mild dysarthria due to poor denture.  Follows simple commands, able to name and repeat.  No gaze palsy, visual field fall, facial symmetrical.  Lateral upper and lower extremity muscle strength symmetrical except right hand grip 3+/5.  Sensation bilateral symmetrical although subjective right hand tingling.  Finger-to-nose intact bilaterally.     Etiology for patient stroke likely large vessel disease due to left P2 high-grade stenosis.  Recommend aspirin 325 and Plavix 75 DAPT for 3 months and then aspirin alone.  Continue Lipitor 40.  Stroke risk factor modification.  PT/OT recommend home health PT/OT."    Today, 11/10/2021, patient being seen for initial hospital follow-up accompanied by his daughter.  Reports residual right hand and right mouth/tongue numbness but has been gradually improving. Denies any weakness Occasional imbalance  -initially using RW but has since graduated to a cane.  Denies any recent falls. Currently working with [facility] PT/OT Occasional difficulty swallowing more so  with liquids and some foods. Was not evaluated by SLP during admission. He is unsure if this is due to numbness in his mouth/tongue or actually swallowing difficulty.   Lives with his wife and daughter - able to maintain ADLs independently  Remains on aspirin Plavix as well as atorvastatin without side effects Blood pressure today elevated on recheck 164/94.  Reports routine monitoring with home therapies and has been stable. Currently in the process of [facility].   Has f/u with [facility] 12/8 Has since seen PCP since d/c  No further concerns at this time     PERTINENT IMAGING  Per recent hospitalization Code Stroke  No evidence of acute intracranial abnormality. Chronic small-vessel infarcts within the right corona radiata/basal ganglia, progressed in number as compared to the head CT of 01/04/2020. Background mild chronic small vessel ischemic changes within the cerebral white matter. Redemonstrated chronic lacunar infarct within the right cerebellar hemisphere. Mild generalized cerebral atrophy. Mild paranasal sinus disease MRI/MRA  Acute left thalamic infarct. Chronic infarcts and chronic microvascular ischemic changes. No large vessel occlusion. Focal high-grade stenoses distal right A2 ACA and left P2 PCA. 2D Echo  1. Left ventricular ejection fraction, by estimation, is 55 to 60%. The left ventricle has normal function. The left ventricle has no regional wall motion abnormalities. There is mild left ventricular hypertrophy.  Left ventricular diastolic parameters  are consistent with Grade I diastolic dysfunction (impaired relaxation).   2. Right ventricular systolic function is normal. The right ventricular  size is normal.  Tricuspid regurgitation signal is inadequate for assessing PA pressure.   3. The mitral valve is normal in structure. Trivial mitral valve  regurgitation. No evidence of mitral stenosis.   4. The aortic valve is grossly normal.  Aortic valve regurgitation is not visualized. No aortic stenosis is present.     ROS:   14 system review of systems performed and negative with exception of those listed in HPI  PMH:  Past Medical History:  Diagnosis Date   Colon polyps 2011   Non cancerous    Diabetes mellitus    Diverticulosis    Elevated PSA 2010   dr. 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    PSH:  Past Surgical History:  Procedure Laterality Date   CYSTOSTOMY W/ BLADDER BIOPSY     removal of benign mole     15 years ago    Social History:  Social History   Socioeconomic History   Marital status: Married    Spouse name: Not on file   Number of children: 4   Years of education: Not on file   Highest education level: Not on file  Occupational History   Occupation: Employed   Tobacco Use   Smoking status: Former    Types: Cigarettes   Smokeless tobacco: Never  Substance and Sexual Activity   Alcohol use: No   Drug use: No   Sexual activity: Not on file  Other Topics Concern   Not on file  Social History Narrative   Not on file   Social Determinants of Health   Financial Resource Strain: Not on file  Food Insecurity: Not on file  Transportation Needs: Not on file  Physical Activity: Not on file  Stress: Not on file  Social Connections: Not on file  Intimate Partner Violence: Not on file    Family History:  Family History  Problem Relation Age of Onset   Heart failure Mother    Pneumonia Father    Coronary artery disease Brother        bowel obstruction   Pneumonia Sister    Stroke Sister    Aneurysm Sister     Medications:   Current Outpatient Medications on File Prior to Visit  Medication Sig Dispense Refill   aspirin 325 MG tablet Take 1 tablet (325 mg total) by mouth daily. 30 tablet 5   atorvastatin (LIPITOR) 40 MG tablet Take 1 tablet (40 mg total) by mouth every  evening. 30 tablet 3   cetirizine (ZYRTEC) 10 MG tablet Take 10 mg by mouth daily as needed.     cholecalciferol (VITAMIN D) 1000 units tablet Take 1,000 Units by mouth daily.     Cholecalciferol (VITAMIN D3) LIQD 1 drop by Does not apply route daily.     cloNIDine (CATAPRES) 0.1 MG tablet Take 0.1 mg by mouth daily.     clopidogrel (PLAVIX) 75 MG tablet Take 1 tablet (75 mg total) by mouth daily. Stop after 90 days, then continue taking Aspirin alone 30 tablet 2   hydrALAZINE (APRESOLINE) 25 MG tablet Take 1 tablet (25 mg total) by mouth in the morning and at bedtime. 60 tablet 11   metFORMIN (GLUCOPHAGE-XR) 500 MG 24 hr tablet Take 500 mg by mouth daily.     OVER THE COUNTER MEDICATION Take 1 tablet by mouth daily. Curemed     thiamine (VITAMIN B-1) 100  MG tablet Take 100 mg by mouth daily.     Turmeric (QC TUMERIC COMPLEX PO) Take 1 tablet by mouth daily.     vitamin B-12 (CYANOCOBALAMIN) 1000 MCG tablet Take 1 tablet (1,000 mcg total) by mouth daily. 30 tablet 3   zinc gluconate 50 MG tablet Take 1 tablet by mouth daily.     No current facility-administered medications on file prior to visit.    Allergies:   Allergies  Allergen Reactions   Lisinopril Swelling    Lip swelling   Amlodipine    Penicillins     Has patient had a PCN reaction causing immediate rash, facial/tongue/throat swelling, SOB or lightheadedness with hypotension: No Has patient had a PCN reaction causing severe rash involving mucus membranes or skin necrosis: No Has patient had a PCN reaction that required hospitalization No Has patient had a PCN reaction occurring within the last 10 years: No  "Locked my knee up" If all of the above answers are "NO", then may proceed with Cephalosporin use.       OBJECTIVE:  Physical Exam  Vitals:   11/10/21 1117 11/10/21 1145  BP: (!) 170/97 (!) 164/94  Pulse: 79   Weight: 179 lb (81.2 kg)   Height: 5\' 10"  (1.778 m)    Body mass index is 25.68 kg/m. No results  found.  General: well developed, well nourished, pleasant elderly male, seated, in no evident distress Head: head normocephalic and atraumatic.   Neck: supple with no carotid or supraclavicular bruits Cardiovascular: regular rate and rhythm, no murmurs Musculoskeletal: no deformity Skin:  no rash/petichiae Vascular:  Normal pulses all extremities   Neurologic Exam Mental Status: Awake and fully alert.  Fluent speech and language.  Oriented to place and time. Recent and remote memory intact. Attention span, concentration and fund of knowledge appropriate. Mood and affect appropriate.  Cranial Nerves: Fundoscopic exam reveals sharp disc margins. Pupils equal, briskly reactive to light. Extraocular movements full without nystagmus. Visual fields full to confrontation. Hearing intact. Facial sensation intact. Face, tongue, palate moves normally and symmetrically.  Motor: Normal bulk and tone. Normal strength in all tested extremity muscles - no evidence of weakness on exam Sensory.: intact to touch , pinprick , position and vibratory sensation although subjective right hand fingertip and right facial numbness Coordination: Rapid alternating movements normal in all extremities. Finger-to-nose and heel-to-shin performed accurately bilaterally. Gait and Station: Arises from chair without difficulty. Stance is normal. Gait demonstrates normal stride length and balance with mild imbalance with use of cane. Tandem walk and heel toe not attempted.  Reflexes: 1+ and symmetric. Toes downgoing.     NIHSS  0 Modified Rankin  2      ASSESSMENT: Dustin Chandler is a 87 y.o. year old male with left thalamic ischemic stroke on 10/01/2021 likely secondary to small vessel disease. Vascular risk factors include HTN, HLD, DM, intracranial stenosis (R A2 ACA and L P2 PCA), advanced age and former tobacco use.      PLAN:  Left thalamic stroke:  Residual deficit: right hand and facial numbness, mild  imbalance and subjective dysphagia.  Continue working with [facility] PT/OT.  Recommended MBS and likely f/u with SLP but pt would like to wait until eval by [facility] neurology in which he plans on discussing further at that time Continue aspirin 325 mg daily and clopidogrel 75 mg daily for total of 3 months then aspirin alone and continue atorvastatin 40 mg daily for secondary stroke prevention.   Discussed secondary  stroke prevention measures and importance of close PCP follow up for aggressive stroke risk factor management. I have gone over the pathophysiology of stroke, warning signs and symptoms, risk factors and their management in some detail with instructions to go to the closest emergency room for symptoms of concern. HTN: BP goal <130/90.  Elevated today although stable at home per patient and daughter.  Monitored by PCP HLD: LDL goal <70. Recent LDL 91.  Continue atorvastatin 40 mg daily per PCP DMII: A1c goal<7.0. Recent A1c 6.0.  Monitored by PCP   Will be establishing care with [facility] neurology therefore will defer ongoing management to their office.  Advised to call with any questions or concerns in the future    CC:  [facility] provider: Dr. Leonie Man PCP: [facility]    I spent 56 minutes of face-to-face and non-face-to-face time with patient and daughter.  This included previsit chart review including review of recent hospitalization, lab review, study review, electronic health record documentation, patient and daughter education regarding recent stroke including etiology, secondary stroke prevention measures and importance of managing stroke risk factors, residual deficits and typical recovery time and answered all other questions to patient and daughters satisfaction  Frann Rider, AGNP-BC  [facility] [address] [place], [place] [address]  Phone [phone] Fax [phone] Note: This document was prepared with digital dictation and possible smart  phrase technology. Any transcriptional errors that result from this process are unintentional.

## 2021-11-12 NOTE — Progress Notes (Signed)
I agree with the above plan 

## 2022-05-30 ENCOUNTER — Ambulatory Visit
Admission: EM | Admit: 2022-05-30 | Discharge: 2022-05-30 | Disposition: A | Discharge status: Home / Self Care | Payer: No Typology Code available for payment source | Attending: Family Medicine | Admitting: Family Medicine

## 2022-05-30 DIAGNOSIS — L259 Unspecified contact dermatitis, unspecified cause: Secondary | ICD-10-CM

## 2022-05-30 MED ORDER — PREDNISONE 10 MG PO TABS: ORAL_TABLET | ORAL | 0 refills | Status: DC

## 2022-05-30 NOTE — ED Provider Notes (Signed)
[facility]    CSN: [id number] Arrival date & time: 05/30/22  1054      History   Chief Complaint Chief Complaint  Patient presents with   Rash    HPI Dustin Chandler is a 88 y.o. male.   Presenting today with 1 month of progressively spreading itching, burning rash across most of his body.  He was seen initially at an urgent care in [place], prescribed 6 days of prednisone and triamcinolone.  Rash slightly improved on the prednisone but came back worse than before following completion.  Has tried numerous over-the-counter products as well with no relief and states the rash is worse than ever at this point.  Has a consultation with dermatology set for 06/08/2022 but could not wait that long.   Past Medical History:  Diagnosis Date   Colon polyps 2011   Non cancerous    Diabetes mellitus    Diverticulosis    Elevated PSA 2010   dr. 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   Patient Active Problem List   Diagnosis Date Noted   Stroke (cerebrum) (HCC) 10/01/2021   Dyslipidemia 11/07/2012   Routine general medical examination at a health care facility 07/01/2012   DM (diabetes mellitus) (HCC) 01/01/2012   ALLERGIC RHINITIS 02/11/2010   PROSTATE SPECIFIC ANTIGEN, ELEVATED 09/20/2009   Overweight (BMI 25.0-29.9) 07/18/2009   FATIGUE 07/17/2009   Essential hypertension 07/18/2008   Past Surgical History:  Procedure Laterality Date   CYSTOSTOMY W/ BLADDER BIOPSY     removal of benign mole     15 years ago     Home Medications    Prior to Admission medications   Medication Sig Start Date End Date Taking? Authorizing Provider  predniSONE (DELTASONE) 10 MG tablet Take 6 tabs daily x 2 days, 5 tabs daily x 2 days, 4 tabs daily x 2 days, etc 05/30/22  Yes Particia Nearing, PA-C  aspirin 325 MG tablet Take 1 tablet (325 mg total) by mouth daily. 10/04/21    Meredeth Ide, MD  atorvastatin (LIPITOR) 40 MG tablet Take 1 tablet (40 mg total) by mouth every evening. 10/03/21   Meredeth Ide, MD  cetirizine (ZYRTEC) 10 MG tablet Take 10 mg by mouth daily as needed. 04/01/15   [provider]  cholecalciferol (VITAMIN D) 1000 units tablet Take 1,000 Units by mouth daily.    [provider]  Cholecalciferol (VITAMIN D3) LIQD 1 drop by Does not apply route daily.    [provider]  cloNIDine (CATAPRES) 0.1 MG tablet Take 0.1 mg by mouth daily.    [provider]  hydrALAZINE (APRESOLINE) 25 MG tablet Take 1 tablet (25 mg total) by mouth in the morning and at bedtime. 10/03/21 10/03/22  Meredeth Ide, MD  metFORMIN (GLUCOPHAGE-XR) 500 MG 24 hr tablet Take 500 mg by mouth daily. 10/08/19   [provider]  OVER THE COUNTER MEDICATION Take 1 tablet by mouth daily. Curemed    [provider]  thiamine (VITAMIN B-1) 100 MG tablet Take 100 mg by mouth daily.    [provider]  Turmeric (QC TUMERIC COMPLEX PO) Take 1 tablet by mouth daily.    [provider]  vitamin B-12 (CYANOCOBALAMIN) 1000 MCG tablet Take 1 tablet (1,000 mcg total) by mouth daily. 10/03/21   Meredeth Ide, MD  zinc  gluconate 50 MG tablet Take 1 tablet by mouth daily. 10/11/13   [provider]   Family History Family History  Problem Relation Age of Onset   Heart failure Mother    Pneumonia Father    Coronary artery disease Brother        bowel obstruction   Pneumonia Sister    Stroke Sister    Aneurysm Sister    Social History Social History   Tobacco Use   Smoking status: Former    Types: Cigarettes   Smokeless tobacco: Never  Vaping Use   Vaping Use: Never used  Substance Use Topics   Alcohol use: No   Drug use: No     Allergies   Lisinopril, Amlodipine, and Penicillins   Review of Systems Review of Systems Per HPI  Physical Exam Triage Vital Signs ED Triage Vitals  Enc Vitals  Group     BP 05/30/22 1220 129/80     Pulse Rate 05/30/22 1220 69     Resp 05/30/22 1220 18     Temp 05/30/22 1220 97.9 F (36.6 C)     Temp Source 05/30/22 1220 Oral     SpO2 05/30/22 1220 99 %     Weight --      Height --      Head Circumference --      Peak Flow --      Pain Score 05/30/22 1218 0     Pain Loc --      Pain Edu? --      Excl. in GC? --    No data found.  Updated Vital Signs BP 129/80 (BP Location: Right Arm)   Pulse 69   Temp 97.9 F (36.6 C) (Oral)   Resp 18   SpO2 99%   Visual Acuity Right Eye Distance:   Left Eye Distance:   Bilateral Distance:    Right Eye Near:   Left Eye Near:    Bilateral Near:     Physical Exam Vitals and nursing note reviewed.  Constitutional:      Appearance: Normal appearance.  HENT:     Head: Atraumatic.  Eyes:     Extraocular Movements: Extraocular movements intact.     Conjunctiva/sclera: Conjunctivae normal.  Cardiovascular:     Rate and Rhythm: Normal rate and regular rhythm.  Pulmonary:     Effort: Pulmonary effort is normal.     Breath sounds: Normal breath sounds.  Musculoskeletal:        General: Normal range of motion.     Cervical back: Normal range of motion and neck supple.  Skin:    General: Skin is warm and dry.     Findings: Rash present.     Comments: Erythematous maculopapular rash scattered sporadically across extremities, trunk sparing the face but going up into scalp.  Some areas scabbed from itching  Neurological:     General: No focal deficit present.     Mental Status: He is oriented to person, place, and time.  Psychiatric:        Mood and Affect: Mood normal.        Thought Content: Thought content normal.        Judgment: Judgment normal.      UC Treatments / Results  Labs (all labs ordered are listed, but only abnormal results are displayed) Labs Reviewed - No data to display  EKG   Radiology No results found.  Procedures Procedures (including critical care  time)  Medications Ordered in  UC Medications - No data to display  Initial Impression / Assessment and Plan / UC Course  I have reviewed the triage vital signs and the nursing notes.  Pertinent labs & imaging results that were available during my care of the patient were reviewed by me and considered in my medical decision making (see chart for details).     Most consistent with poison ivy dermatitis, likely rebound from short course of steroids previously.  We will treat with extended taper, continue triamcinolone.  Follow-up with dermatology as scheduled.  Final Clinical Impressions(s) / UC Diagnoses   Final diagnoses:  Contact dermatitis, unspecified contact dermatitis type, unspecified trigger   Discharge Instructions   None    ED Prescriptions     Medication Sig Dispense Auth. Provider   predniSONE (DELTASONE) 10 MG tablet Take 6 tabs daily x 2 days, 5 tabs daily x 2 days, 4 tabs daily x 2 days, etc 42 tablet Particia Nearing, [place]      PDMP not reviewed this encounter.   Particia Nearing, [place] 05/30/22 1320

## 2022-05-30 NOTE — ED Triage Notes (Signed)
Pt states he has had rah all over his body for about a month  Pt states that they went to the Urgent Care in [place] and was prescribed Prednisone and Triamcinolone   Pt states that he has an appointment with the Dermatologist on 06/08/2022  Pt states he has used Alovera and Aveeno on the rash as well  Pt states the rash itches and burns

## 2022-08-13 ENCOUNTER — Emergency Department (HOSPITAL_COMMUNITY): Payer: No Typology Code available for payment source

## 2022-08-13 ENCOUNTER — Encounter (HOSPITAL_COMMUNITY): Payer: Self-pay

## 2022-08-13 ENCOUNTER — Other Ambulatory Visit: Payer: Self-pay

## 2022-08-13 DIAGNOSIS — I2699 Other pulmonary embolism without acute cor pulmonale: Principal | ICD-10-CM | POA: Diagnosis present

## 2022-08-13 DIAGNOSIS — J9811 Atelectasis: Secondary | ICD-10-CM | POA: Diagnosis present

## 2022-08-13 DIAGNOSIS — D649 Anemia, unspecified: Secondary | ICD-10-CM | POA: Diagnosis present

## 2022-08-13 DIAGNOSIS — E119 Type 2 diabetes mellitus without complications: Secondary | ICD-10-CM | POA: Diagnosis present

## 2022-08-13 DIAGNOSIS — D696 Thrombocytopenia, unspecified: Secondary | ICD-10-CM | POA: Diagnosis present

## 2022-08-13 DIAGNOSIS — E782 Mixed hyperlipidemia: Secondary | ICD-10-CM | POA: Diagnosis present

## 2022-08-13 DIAGNOSIS — Z880 Allergy status to penicillin: Secondary | ICD-10-CM

## 2022-08-13 DIAGNOSIS — I82411 Acute embolism and thrombosis of right femoral vein: Secondary | ICD-10-CM | POA: Diagnosis present

## 2022-08-13 DIAGNOSIS — I10 Essential (primary) hypertension: Secondary | ICD-10-CM | POA: Diagnosis present

## 2022-08-13 DIAGNOSIS — Z7984 Long term (current) use of oral hypoglycemic drugs: Secondary | ICD-10-CM

## 2022-08-13 DIAGNOSIS — Z8249 Family history of ischemic heart disease and other diseases of the circulatory system: Secondary | ICD-10-CM

## 2022-08-13 DIAGNOSIS — Z87891 Personal history of nicotine dependence: Secondary | ICD-10-CM

## 2022-08-13 DIAGNOSIS — R7989 Other specified abnormal findings of blood chemistry: Secondary | ICD-10-CM | POA: Diagnosis present

## 2022-08-13 DIAGNOSIS — Z8673 Personal history of transient ischemic attack (TIA), and cerebral infarction without residual deficits: Secondary | ICD-10-CM

## 2022-08-13 DIAGNOSIS — Z79899 Other long term (current) drug therapy: Secondary | ICD-10-CM

## 2022-08-13 DIAGNOSIS — Z7982 Long term (current) use of aspirin: Secondary | ICD-10-CM

## 2022-08-13 DIAGNOSIS — Z888 Allergy status to other drugs, medicaments and biological substances status: Secondary | ICD-10-CM

## 2022-08-13 LAB — CBC WITH DIFFERENTIAL/PLATELET
Abs Immature Granulocytes: 0.01 10*3/uL (ref 0.00–0.07)
Basophils Absolute: 0 10*3/uL (ref 0.0–0.1)
Basophils Relative: 0 %
Eosinophils Absolute: 0 10*3/uL (ref 0.0–0.5)
Eosinophils Relative: 0 %
HCT: 40.7 % (ref 39.0–52.0)
Hemoglobin: 12.9 g/dL — ABNORMAL LOW (ref 13.0–17.0)
Immature Granulocytes: 0 %
Lymphocytes Relative: 13 %
Lymphs Abs: 0.9 10*3/uL (ref 0.7–4.0)
MCH: 29.3 pg (ref 26.0–34.0)
MCHC: 31.7 g/dL (ref 30.0–36.0)
MCV: 92.5 fL (ref 80.0–100.0)
Monocytes Absolute: 1.1 10*3/uL — ABNORMAL HIGH (ref 0.1–1.0)
Monocytes Relative: 16 %
Neutro Abs: 4.8 10*3/uL (ref 1.7–7.7)
Neutrophils Relative %: 71 %
Platelets: 114 10*3/uL — ABNORMAL LOW (ref 150–400)
RBC: 4.4 MIL/uL (ref 4.22–5.81)
RDW: 14 % (ref 11.5–15.5)
WBC: 6.8 10*3/uL (ref 4.0–10.5)
nRBC: 0 % (ref 0.0–0.2)

## 2022-08-13 LAB — BASIC METABOLIC PANEL
Anion gap: 9 (ref 5–15)
BUN: 21 mg/dL (ref 8–23)
CO2: 24 mmol/L (ref 22–32)
Calcium: 10.1 mg/dL (ref 8.9–10.3)
Chloride: 107 mmol/L (ref 98–111)
Creatinine, Ser: 0.73 mg/dL (ref 0.61–1.24)
GFR, Estimated: >60 mL/min (ref >60)
Glucose, Bld: 124 mg/dL — ABNORMAL HIGH (ref 70–99)
Potassium: 3.7 mmol/L (ref 3.5–5.1)
Sodium: 140 mmol/L (ref 135–145)

## 2022-08-13 NOTE — ED Triage Notes (Signed)
Pt arrived via POV c/o right side ribcage/back pain. Pt reports laying down at apprx 1900 tonight when pain began. Pt denies injury or falls recently. Pt reports having a stroke last year, an dhas right hand weakness/numbness and slurred speech is resolving over time at baseline.

## 2022-08-14 ENCOUNTER — Emergency Department (HOSPITAL_COMMUNITY): Payer: No Typology Code available for payment source

## 2022-08-14 ENCOUNTER — Inpatient Hospital Stay (HOSPITAL_COMMUNITY): Payer: No Typology Code available for payment source

## 2022-08-14 ENCOUNTER — Observation Stay (HOSPITAL_COMMUNITY): Payer: No Typology Code available for payment source

## 2022-08-14 ENCOUNTER — Inpatient Hospital Stay (HOSPITAL_COMMUNITY)
Admission: EM | Admit: 2022-08-14 | Discharge: 2022-08-15 | DRG: 176 | Disposition: A | Discharge status: Home / Self Care | Payer: No Typology Code available for payment source | Attending: Internal Medicine | Admitting: Internal Medicine

## 2022-08-14 DIAGNOSIS — Z8249 Family history of ischemic heart disease and other diseases of the circulatory system: Secondary | ICD-10-CM | POA: Diagnosis not present

## 2022-08-14 DIAGNOSIS — R0781 Pleurodynia: Secondary | ICD-10-CM

## 2022-08-14 DIAGNOSIS — I10 Essential (primary) hypertension: Secondary | ICD-10-CM | POA: Diagnosis present

## 2022-08-14 DIAGNOSIS — Z79899 Other long term (current) drug therapy: Secondary | ICD-10-CM | POA: Diagnosis not present

## 2022-08-14 DIAGNOSIS — Z888 Allergy status to other drugs, medicaments and biological substances status: Secondary | ICD-10-CM | POA: Diagnosis not present

## 2022-08-14 DIAGNOSIS — E119 Type 2 diabetes mellitus without complications: Secondary | ICD-10-CM | POA: Diagnosis present

## 2022-08-14 DIAGNOSIS — D649 Anemia, unspecified: Secondary | ICD-10-CM | POA: Diagnosis present

## 2022-08-14 DIAGNOSIS — R7989 Other specified abnormal findings of blood chemistry: Secondary | ICD-10-CM | POA: Diagnosis present

## 2022-08-14 DIAGNOSIS — Z880 Allergy status to penicillin: Secondary | ICD-10-CM | POA: Diagnosis not present

## 2022-08-14 DIAGNOSIS — Z8673 Personal history of transient ischemic attack (TIA), and cerebral infarction without residual deficits: Secondary | ICD-10-CM | POA: Diagnosis not present

## 2022-08-14 DIAGNOSIS — I82411 Acute embolism and thrombosis of right femoral vein: Secondary | ICD-10-CM | POA: Diagnosis present

## 2022-08-14 DIAGNOSIS — Z7982 Long term (current) use of aspirin: Secondary | ICD-10-CM | POA: Diagnosis not present

## 2022-08-14 DIAGNOSIS — I2602 Saddle embolus of pulmonary artery with acute cor pulmonale: Secondary | ICD-10-CM | POA: Diagnosis not present

## 2022-08-14 DIAGNOSIS — D696 Thrombocytopenia, unspecified: Secondary | ICD-10-CM

## 2022-08-14 DIAGNOSIS — E782 Mixed hyperlipidemia: Secondary | ICD-10-CM | POA: Diagnosis present

## 2022-08-14 DIAGNOSIS — E785 Hyperlipidemia, unspecified: Secondary | ICD-10-CM | POA: Diagnosis present

## 2022-08-14 DIAGNOSIS — J9811 Atelectasis: Secondary | ICD-10-CM

## 2022-08-14 DIAGNOSIS — I2699 Other pulmonary embolism without acute cor pulmonale: Secondary | ICD-10-CM | POA: Diagnosis present

## 2022-08-14 DIAGNOSIS — Z7984 Long term (current) use of oral hypoglycemic drugs: Secondary | ICD-10-CM | POA: Diagnosis not present

## 2022-08-14 DIAGNOSIS — Z87891 Personal history of nicotine dependence: Secondary | ICD-10-CM | POA: Diagnosis not present

## 2022-08-14 DIAGNOSIS — Z86711 Personal history of pulmonary embolism: Secondary | ICD-10-CM | POA: Diagnosis present

## 2022-08-14 LAB — BRAIN NATRIURETIC PEPTIDE: B Natriuretic Peptide: 114 pg/mL — ABNORMAL HIGH (ref 0.0–100.0)

## 2022-08-14 LAB — CBG MONITORING, ED
Glucose-Capillary: 119 mg/dL — ABNORMAL HIGH (ref 70–99)
Glucose-Capillary: 143 mg/dL — ABNORMAL HIGH (ref 70–99)
Glucose-Capillary: 108 mg/dL — ABNORMAL HIGH (ref 70–99)
Glucose-Capillary: 101 mg/dL — ABNORMAL HIGH (ref 70–99)

## 2022-08-14 LAB — HEMOGLOBIN A1C
Hgb A1c MFr Bld: 6.1 % — ABNORMAL HIGH (ref 4.8–5.6)
Mean Plasma Glucose: 128.37 mg/dL

## 2022-08-14 LAB — ECHOCARDIOGRAM COMPLETE
Area-P 1/2: 2.27 cm2
S' Lateral: 2.9 cm

## 2022-08-14 LAB — TROPONIN I (HIGH SENSITIVITY)
Troponin I (High Sensitivity): 4 ng/L (ref <18)
Troponin I (High Sensitivity): 5 ng/L (ref <18)

## 2022-08-14 LAB — HEPARIN LEVEL (UNFRACTIONATED)
Heparin Unfractionated: 0.94 IU/mL — ABNORMAL HIGH (ref 0.30–0.70)
Heparin Unfractionated: 0.73 IU/mL — ABNORMAL HIGH (ref 0.30–0.70)

## 2022-08-14 LAB — MAGNESIUM: Magnesium: <0.5 mg/dL — CL (ref 1.7–2.4)

## 2022-08-14 LAB — PHOSPHORUS: Phosphorus: 2.7 mg/dL (ref 2.5–4.6)

## 2022-08-14 MED ORDER — HEPARIN BOLUS VIA INFUSION
5000.0000 [IU] | Freq: Once | INTRAVENOUS | Status: AC
  Administered 2022-08-14: 5000 [IU] via INTRAVENOUS

## 2022-08-14 MED ORDER — HEPARIN (PORCINE) 25000 UT/250ML-% IV SOLN
900.0000 [IU]/h | INTRAVENOUS | Status: DC
  Administered 2022-08-14: 900 [IU]/h via INTRAVENOUS
  Administered 2022-08-14: 1200 [IU]/h via INTRAVENOUS
  Filled 2022-08-14 (×2): qty 250

## 2022-08-14 MED ORDER — METHOCARBAMOL 500 MG PO TABS
500.0000 mg | ORAL_TABLET | Freq: Once | ORAL | Status: AC
Start: 2022-08-14 — End: 2022-08-14
  Administered 2022-08-14: 500 mg via ORAL
  Filled 2022-08-14: qty 1

## 2022-08-14 MED ORDER — ACETAMINOPHEN 650 MG RE SUPP: 650.0000 mg | Freq: Four times a day (QID) | RECTAL | Status: DC | PRN

## 2022-08-14 MED ORDER — INSULIN ASPART 100 UNIT/ML IJ SOLN
0.0000 [IU] | Freq: Three times a day (TID) | INTRAMUSCULAR | Status: DC
  Administered 2022-08-14: 2 [IU] via SUBCUTANEOUS
  Filled 2022-08-14: qty 1

## 2022-08-14 MED ORDER — CLONIDINE HCL 0.1 MG PO TABS
0.1000 mg | ORAL_TABLET | Freq: Every day | ORAL | Status: DC
  Administered 2022-08-14 – 2022-08-15 (×2): 0.1 mg via ORAL
  Filled 2022-08-14 (×2): qty 1

## 2022-08-14 MED ORDER — ACETAMINOPHEN 325 MG PO TABS: 650.0000 mg | ORAL_TABLET | Freq: Four times a day (QID) | ORAL | Status: DC | PRN

## 2022-08-14 MED ORDER — IOHEXOL 350 MG/ML SOLN
100.0000 mL | Freq: Once | INTRAVENOUS | Status: AC | PRN
Start: 2022-08-14 — End: 2022-08-14
  Administered 2022-08-14: 100 mL via INTRAVENOUS

## 2022-08-14 MED ORDER — ATORVASTATIN CALCIUM 40 MG PO TABS
40.0000 mg | ORAL_TABLET | Freq: Every evening | ORAL | Status: DC
  Administered 2022-08-14: 40 mg via ORAL
  Filled 2022-08-14: qty 1

## 2022-08-14 MED ORDER — OXYCODONE HCL 5 MG PO TABS: 5.0000 mg | ORAL_TABLET | ORAL | Status: DC | PRN

## 2022-08-14 MED ORDER — FENTANYL CITRATE PF 50 MCG/ML IJ SOSY
50.0000 ug | PREFILLED_SYRINGE | Freq: Once | INTRAMUSCULAR | Status: AC
  Administered 2022-08-14: 50 ug via INTRAVENOUS
  Filled 2022-08-14: qty 1

## 2022-08-14 NOTE — ED Notes (Signed)
Breakfast tray given to pt 

## 2022-08-14 NOTE — TOC Progression Note (Signed)
  Transition of Care [facility]) Screening Note   Patient Details  Name: [name] Date of Birth: 03-22-1934   Transition of Care [facility]) CM/SW Contact:    Leitha Bleak, RN Phone Number: 08/14/2022, 2:20 PM   [facility] notification completed - [id number]   Transition of Care Department (TOC) has reviewed patient and no TOC needs have been identified at this time. We will continue to monitor patient advancement through interdisciplinary progression rounds. If new patient transition needs arise, please place a TOC consult.     Barriers to Discharge: Continued Medical Work up  Expected Discharge Plan and Services       Living arrangements for the past 2 months: Single Family Home

## 2022-08-14 NOTE — Progress Notes (Signed)
ANTICOAGULATION CONSULT NOTE  Pharmacy Consult for Heparin  Indication: pulmonary embolus Brief A/P: Heparin level slightly supratherapeutic Continue Heparin at current rate for now  Allergies  Allergen Reactions   Lisinopril Swelling    Lip swelling   Amlodipine    Penicillins     Has patient had a PCN reaction causing immediate rash, facial/tongue/throat swelling, SOB or lightheadedness with hypotension: No Has patient had a PCN reaction causing severe rash involving mucus membranes or skin necrosis: No Has patient had a PCN reaction that required hospitalization No Has patient had a PCN reaction occurring within the last 10 years: No  "Locked my knee up" If all of the above answers are "NO", then may proceed with Cephalosporin use.     Patient Measurements: Height: 5\' 10"  (177.8 cm) Weight: 82 kg (180 lb 12.4 oz) IBW/kg (Calculated) : 73  Vital Signs: Temp: 98.9 F (37.2 C) (08/27 2052) Temp Source: Oral (08/27 2052) BP: 176/98 (08/27 2052) Pulse Rate: 62 (08/27 2052)  Labs: Recent Labs    08/13/22 2131 08/14/22 0135 08/14/22 0337 08/14/22 1102 08/14/22 2232  HGB 12.9*  --   --   --   --   HCT 40.7  --   --   --   --   PLT 114*  --   --   --   --   HEPARINUNFRC  --   --   --  0.94* 0.73*  CREATININE 0.73  --   --   --   --   TROPONINIHS  --  4 5  --   --      Estimated Creatinine Clearance: 68.4 mL/min (by C-G formula based on SCr of 0.73 mg/dL).  Assessment: 88 y.o. male with new PE for heparin.    Goal of Therapy:  Heparin level 0.3-0.7 units/ml Monitor platelets by anticoagulation protocol: Yes   Plan:  Continue Heparin at current rate for now Follow-up am labs and adjust rate if still supratherapeutic at that time  88, PharmD, BCPS

## 2022-08-14 NOTE — H&P (Signed)
History and Physical    Patient: Dustin Chandler:[id number] DOB: March 02, 1934 DOA: 08/14/2022 DOS: the patient was seen and examined on 08/14/2022 PCP: [facility]  Patient coming from: Home  Chief Complaint:  Chief Complaint  Patient presents with   Back Pain   HPI: Dustin Chandler is an 88 y.o. male with medical history significant of T2DM, hypertension who presents to the emergency department due to 2-day onset of right-sided chest pain with radiation to the back.  Patient thought that it was due to possible pull of muscle when the pain first started, however, since pain worsened yesterday with some shortness of breath, he told his wife about the pain and she suggested that patient goes to the ED for further evaluation and management.  Patient also complaining of intermittent bilateral lower extremity edema which improves with movement.  He denies fever, chills, palpitations, nausea, vomiting.   ED Course:  In the emergency department, BP was 171/86, other vital signs were within normal range.  Work-up in the ED showed normocytic anemia and thrombocytopenia, BMP was normal except for blood glucose level of 124.  Troponin x2 - 4 > 5, BNP 114.0 CT angiography of chest was positive for acute right PE with CT evidence of right heart strain (RV/LV Ratio = 1.1) consistent with at least submassive (intermediate risk) PE. Chest x-ray showed bibasilar opacities, likely atelectasis Patient was treated with IV fentanyl and Robaxin, heparin subcu was started.  Hospitalist was asked to admit patient for further evaluation and management.  Review of Systems: Review of systems as noted in the HPI. All other systems reviewed and are negative.   Past Medical History:  Diagnosis Date   Colon polyps 2011   Non cancerous    CVA (cerebral vascular accident) (HCC) 10/01/2021   Diabetes mellitus    Diverticulosis    Elevated PSA 2010   dr. 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 His smoking use included cigarettes. He has never used smokeless tobacco. He reports that he does not drink alcohol and does not use drugs.   Allergies  Allergen Reactions   Lisinopril Swelling    Lip swelling   Amlodipine    Penicillins     Has patient had a PCN reaction causing immediate rash, facial/tongue/throat swelling, SOB or lightheadedness with hypotension: No Has patient had a PCN reaction causing severe rash involving mucus membranes or skin necrosis: No Has patient had a PCN reaction that required hospitalization No Has patient had a PCN reaction occurring within the last 10 years: No  "Locked my knee up" If all of the above answers are "NO", then may proceed with Cephalosporin use.     Family History  Problem Relation Age of Onset   Heart failure Mother    Pneumonia Father    Coronary artery disease Brother        bowel obstruction   Pneumonia Sister    Stroke Sister    Aneurysm Sister      Prior to Admission medications   Medication Sig Start Date End Date Taking? Authorizing Provider  aspirin 325 MG tablet Take 1  tablet (325 mg total) by mouth daily. 10/04/21   Meredeth Ide, MD  atorvastatin (LIPITOR) 40 MG tablet Take 1 tablet (40 mg total) by mouth every evening. 10/03/21   Meredeth Ide, MD  cetirizine (ZYRTEC) 10 MG tablet Take 10 mg by mouth daily as needed. 04/01/15   [provider]  cholecalciferol (VITAMIN D) 1000 units tablet Take 1,000 Units by mouth daily.    [provider]  Cholecalciferol (VITAMIN D3) LIQD 1 drop by Does not apply route daily.    [provider]  cloNIDine (CATAPRES) 0.1 MG tablet Take 0.1 mg by mouth daily.    [provider]  hydrALAZINE (APRESOLINE) 25 MG tablet Take 1 tablet (25 mg total) by mouth in the morning and at bedtime. 10/03/21 10/03/22  Meredeth Ide, MD  metFORMIN (GLUCOPHAGE-XR) 500 MG 24 hr tablet Take 500 mg by mouth daily. 10/08/19   [provider]  OVER THE COUNTER MEDICATION Take 1 tablet by mouth daily. [facility]    [provider]  predniSONE (DELTASONE) 10 MG tablet Take 6 tabs daily x 2 days, 5 tabs daily x 2 days, 4 tabs daily x 2 days, etc 05/30/22   Particia Nearing, PA-C  thiamine (VITAMIN B-1) 100 MG tablet Take 100 mg by mouth daily.    [provider]  Turmeric (QC TUMERIC COMPLEX PO) Take 1 tablet by mouth daily.    [provider]  vitamin B-12 (CYANOCOBALAMIN) 1000 MCG tablet Take 1 tablet (1,000 mcg total) by mouth daily. 10/03/21   Meredeth Ide, MD  zinc gluconate 50 MG tablet Take 1 tablet by mouth daily. 10/11/13   [provider]    Physical Exam: BP (!) 155/79   Pulse 63   Temp 98.1 F (36.7 C) (Oral)   Resp 18   Ht 5\' 10"  (1.778 m)   Wt 82 kg   SpO2 94%   BMI 25.94 kg/m   General: 88 y.o. year-old male well developed well nourished in no acute distress.  Alert and oriented x3. HEENT: NCAT, EOMI Neck: Supple, trachea medial Cardiovascular: Regular rate and rhythm with no rubs or gallops.  No thyromegaly or JVD noted.  No lower extremity edema. 2/4 pulses in all 4 extremities. Respiratory: Clear to auscultation with no wheezes or rales. Good inspiratory effort. Abdomen: Soft, nontender nondistended with normal bowel sounds x4 quadrants. Muskuloskeletal: No cyanosis, clubbing or edema noted bilaterally Neuro: CN II-XII intact, strength 5/5 x 4, sensation, reflexes intact Skin: No ulcerative lesions noted or rashes Psychiatry: Mood is appropriate for condition and setting          Labs on Admission:  Basic Metabolic Panel: Recent Labs  Lab 08/13/22 2131  NA 140  K 3.7  CL 107  CO2 24  GLUCOSE  124*  BUN 21  CREATININE 0.73  CALCIUM 10.1   Liver Function Tests: No results for input(s): "AST", "ALT", "ALKPHOS", "BILITOT", "PROT", "ALBUMIN" in the last 168 hours. No results for input(s): "LIPASE", "AMYLASE" in the last 168 hours. No results for input(s): "AMMONIA" in the last 168 hours. CBC: Recent Labs  Lab 08/13/22 2131  WBC 6.8  NEUTROABS 4.8  HGB 12.9*  HCT 40.7  MCV 92.5  PLT 114*   Cardiac Enzymes: No results for input(s): "CKTOTAL", "CKMB", "CKMBINDEX", "TROPONINI" in the last 168 hours.  BNP (last 3 results) Recent Labs    08/14/22 0135  BNP 114.0*    ProBNP (last 3 results) No results for  input(s): "PROBNP" in the last 8760 hours.  CBG: No results for input(s): "GLUCAP" in the last 168 hours.  Radiological Exams on Admission: CT Angio Chest/Abd/Pel for Dissection W and/or Wo Contrast  Result Date: 08/14/2022 CLINICAL DATA:  Right-sided chest pain EXAM: CT ANGIOGRAPHY CHEST, ABDOMEN AND PELVIS TECHNIQUE: Non-contrast CT of the chest was initially obtained. Multidetector CT imaging through the chest, abdomen and pelvis was performed using the standard protocol during bolus administration of intravenous contrast. Multiplanar reconstructed images and MIPs were obtained and reviewed to evaluate the vascular anatomy. RADIATION DOSE REDUCTION: This exam was performed according to the departmental dose-optimization program which includes automated exposure control, adjustment of the mA and/or kV according to patient size and/or use of iterative reconstruction technique. CONTRAST:  OMNIPAQUE IOHEXOL 350 MG/ML SOLN COMPARISON:  Chest x-ray from the previous day. FINDINGS: CTA CHEST FINDINGS Cardiovascular: Initial precontrast images demonstrate atherosclerotic calcifications. No hyperdense crescent to suggest acute aortic injury is noted. Post-contrast images demonstrate no aneurysmal dilatation or dissection. Heart is at the upper limits of normal in size. No  coronary calcifications are seen. The pulmonary artery shows a normal branching pattern bilaterally. Multiple filling defects are noted throughout the right pulmonary arterial tree consistent with pulmonary emboli. Right ventricular dilatation consistent with right heart strain is noted. Mediastinum/Nodes: Thoracic inlet is within normal limits. No hilar or mediastinal adenopathy is noted. A few scattered calcified hilar nodes are noted consistent with prior granulomatous disease. The esophagus as visualized is within normal limits. Lungs/Pleura: Lungs are well aerated bilaterally. Bibasilar atelectatic changes are noted. No sizable effusion is seen. Musculoskeletal: Degenerative changes of the thoracic spine are noted. No acute rib abnormality is seen. Review of the MIP images confirms the above findings. CTA ABDOMEN AND PELVIS FINDINGS VASCULAR Aorta: Abdominal aorta demonstrates atherosclerotic calcification and tortuosity without evidence of aneurysmal dilatation. Celiac: Origin of the celiac axis is within normal limits. There is a calcified somewhat tubular structure extending along the course of the hepatic artery of uncertain significance. This could be related to prior vascular intervention although no given history is noted. SMA: Patent without evidence of aneurysm, dissection, vasculitis or significant stenosis. Renals: Both renal arteries are patent without evidence of aneurysm, dissection, vasculitis, fibromuscular dysplasia or significant stenosis. IMA: Patent without evidence of aneurysm, dissection, vasculitis or significant stenosis. Inflow: Iliacs demonstrate mild atherosclerotic calcifications without aneurysmal dilatation. Veins: No specific venous abnormality is noted. Review of the MIP images confirms the above findings. NON-VASCULAR Hepatobiliary: Liver demonstrates mild fatty infiltration. Gallbladder is unremarkable. Pancreas: Unremarkable. No pancreatic ductal dilatation or surrounding  inflammatory changes. Spleen: Normal in size without focal abnormality. Adrenals/Urinary Tract: Adrenal glands are within normal limits. Kidneys demonstrate a normal enhancement pattern bilaterally. No renal calculi are seen. No obstructive changes are noted. The bladder is well distended. Stomach/Bowel: Considerable fecal material is noted within the rectal vault likely related to a degree of rectal impaction. No proximal obstructive changes are seen however. Colon shows scattered fecal material without obstructive change. The appendix is not well visualized although no inflammatory changes to suggest appendicitis are seen. Small bowel and stomach are unremarkable. Lymphatic: No significant lymphadenopathy is seen. Reproductive: Prostate is unremarkable. Other: No abdominal wall hernia or abnormality. No abdominopelvic ascites. Musculoskeletal: Symmetrical sclerosis of the femoral heads is noted. No acute bony abnormality is noted. Degenerative changes of lumbar spine are seen. Review of the MIP images confirms the above findings. IMPRESSION: CTA of the chest: Positive for acute right PE with CT evidence of  right heart strain (RV/LV Ratio = 1.1) consistent with at least submassive (intermediate risk) PE. The presence of right heart strain has been associated with an increased risk of morbidity and mortality. Please refer to the "Code PE Focused" order set in EPIC. Changes of prior granulomatous disease. Bibasilar atelectasis. CTA of the abdomen and pelvis: Fatty liver. Findings consistent with rectal impaction and mild colonic constipation. Tubular calcified structure overlying along the course of the hepatic artery which may represent a thrombosed vessel or possibly prior thrombosed graft. Correlate with clinical history. Critical Value/emergent results were called by telephone at the time of interpretation on 08/14/2022 at 2:25 am to Dr. Marily Memos , who verbally acknowledged these results. Electronically Signed    By: Alcide Clever M.D.   On: 08/14/2022 02:27   DG Chest 2 View  Result Date: 08/13/2022 CLINICAL DATA:  Cough, chest pain EXAM: CHEST - 2 VIEW COMPARISON:  None Available. FINDINGS: Bibasilar opacities, likely atelectasis. Heart is normal size. Aorta tortuous, calcified. Mediastinal contours within normal limits. No effusions or acute bony abnormality. IMPRESSION: Bibasilar opacities, likely atelectasis. Electronically Signed   By: Charlett Nose M.D.   On: 08/13/2022 22:06    EKG: I independently viewed the EKG done and my findings are as followed: EKG was not done in the ED  Assessment/Plan Present on Admission:  Acute pulmonary embolism (HCC)  Essential hypertension  Dyslipidemia  Principal Problem:   Acute pulmonary embolism (HCC) Active Problems:   Essential hypertension   DM (diabetes mellitus) (HCC)   Dyslipidemia   Pleuritic chest pain   Mild bibasilar atelectasis   Thrombocytopenia (HCC)   Elevated brain natriuretic peptide (BNP) level  Acute pulmonary embolism CTA of her chest showed acute pulmonary embolism with right heart strain Patient was started on IV heparin drip with plan to transition to DOAC in the morning Echocardiogram will be done in the morning  Pleuritic chest pain Continue Tylenol as needed for mild pain Continue oxycodone as needed for moderate/severe pain  Bibasilar atelectasis Continue incentive spirometry  Chronic thrombocytopenia Platelets 114, continue to monitor platelet levels milligrams  Elevated BNP, rule out CHF BNP 114 Continue total input/output, daily weights and fluid restriction Continue Cardiac diet  Echocardiogram in the morning   Type 2 diabetes mellitus CBG 124, hemoglobin A1c on 10/02/2021 was 6.0 Continue ISS and hypoglycemia protocol Metformin will be held at this time  Essential hypertension Continue clonidine per home regimen  Mixed hyperlipidemia Continue Lipitor   DVT prophylaxis: Heparin drip  Code  Status: Full code  Consults: None  Family Communication: None at bedside  Severity of Illness: The appropriate patient status for this patient is OBSERVATION. Observation status is judged to be reasonable and necessary in order to provide the required intensity of service to ensure the patient's safety. The patient's presenting symptoms, physical exam findings, and initial radiographic and laboratory data in the context of their medical condition is felt to place them at decreased risk for further clinical deterioration. Furthermore, it is anticipated that the patient will be medically stable for discharge from the hospital within 2 midnights of admission.   Author: Frankey Shown, DO 08/14/2022 6:52 AM  For on call review [email or web address].

## 2022-08-14 NOTE — ED Provider Notes (Signed)
[facility] EMERGENCY DEPARTMENT Provider Note   CSN: [id number] Arrival date & time: 08/13/22  1950     History  Chief Complaint  Patient presents with   Back Pain    Dustin Chandler is a 88 y.o. male.  88 yo M here with right sided pain that radiates to the back. Pleuritic. Mild dyspnea. No other associated symptoms. Has intermittent but worsening BLE edema. Improves with movement.    Back Pain      Home Medications Prior to Admission medications   Medication Sig Start Date End Date Taking? Authorizing Provider  aspirin 325 MG tablet Take 1 tablet (325 mg total) by mouth daily. 10/04/21   Meredeth Ide, MD  atorvastatin (LIPITOR) 40 MG tablet Take 1 tablet (40 mg total) by mouth every evening. 10/03/21   Meredeth Ide, MD  cetirizine (ZYRTEC) 10 MG tablet Take 10 mg by mouth daily as needed. 04/01/15   [provider]  cholecalciferol (VITAMIN D) 1000 units tablet Take 1,000 Units by mouth daily.    [provider]  Cholecalciferol (VITAMIN D3) LIQD 1 drop by Does not apply route daily.    [provider]  cloNIDine (CATAPRES) 0.1 MG tablet Take 0.1 mg by mouth daily.    [provider]  hydrALAZINE (APRESOLINE) 25 MG tablet Take 1 tablet (25 mg total) by mouth in the morning and at bedtime. 10/03/21 10/03/22  Meredeth Ide, MD  metFORMIN (GLUCOPHAGE-XR) 500 MG 24 hr tablet Take 500 mg by mouth daily. 10/08/19   [provider]  OVER THE COUNTER MEDICATION Take 1 tablet by mouth daily. Curemed    [provider]  predniSONE (DELTASONE) 10 MG tablet Take 6 tabs daily x 2 days, 5 tabs daily x 2 days, 4 tabs daily x 2 days, etc 05/30/22   Particia Nearing, PA-C  thiamine (VITAMIN B-1) 100 MG tablet Take 100 mg by mouth daily.    [provider]  Turmeric (QC TUMERIC COMPLEX PO) Take 1 tablet by mouth daily.    [provider]  vitamin B-12 (CYANOCOBALAMIN) 1000 MCG tablet Take 1 tablet (1,000 mcg  total) by mouth daily. 10/03/21   Meredeth Ide, MD  zinc gluconate 50 MG tablet Take 1 tablet by mouth daily. 10/11/13   [provider]      Allergies    Lisinopril, Amlodipine, and Penicillins    Review of Systems   Review of Systems  Musculoskeletal:  Positive for back pain.    Physical Exam Updated Vital Signs BP (!) 177/93   Pulse 70   Temp 98.7 F (37.1 C) (Oral)   Resp 16   Ht 5\' 10"  (1.778 m)   Wt 82 kg   SpO2 97%   BMI 25.94 kg/m  Physical Exam Vitals and nursing note reviewed.  Constitutional:      Appearance: He is well-developed.  HENT:     Head: Normocephalic and atraumatic.     Mouth/Throat:     Mouth: Mucous membranes are moist.     Pharynx: Oropharynx is clear.  Eyes:     Pupils: Pupils are equal, round, and reactive to light.  Cardiovascular:     Rate and Rhythm: Normal rate.  Pulmonary:     Effort: Pulmonary effort is normal. No respiratory distress.  Abdominal:     General: Abdomen is flat. There is no distension.  Musculoskeletal:        General: Normal range of motion.  Cervical back: Normal range of motion.  Skin:    General: Skin is warm and dry.  Neurological:     General: No focal deficit present.     Mental Status: He is alert.     ED Results / Procedures / Treatments   Labs (all labs ordered are listed, but only abnormal results are displayed) Labs Reviewed  CBC WITH DIFFERENTIAL/PLATELET - Abnormal; Notable for the following components:      Result Value   Hemoglobin 12.9 (*)    Platelets 114 (*)    Monocytes Absolute 1.1 (*)    All other components within normal limits  BASIC METABOLIC PANEL - Abnormal; Notable for the following components:   Glucose, Bld 124 (*)    All other components within normal limits  BRAIN NATRIURETIC PEPTIDE - Abnormal; Notable for the following components:   B Natriuretic Peptide 114.0 (*)    All other components within normal limits  HEPARIN LEVEL (UNFRACTIONATED)  TROPONIN I  (HIGH SENSITIVITY)  TROPONIN I (HIGH SENSITIVITY)    EKG None  Radiology CT Angio Chest/Abd/Pel for Dissection W and/or Wo Contrast  Result Date: 08/14/2022 CLINICAL DATA:  Right-sided chest pain EXAM: CT ANGIOGRAPHY CHEST, ABDOMEN AND PELVIS TECHNIQUE: Non-contrast CT of the chest was initially obtained. Multidetector CT imaging through the chest, abdomen and pelvis was performed using the standard protocol during bolus administration of intravenous contrast. Multiplanar reconstructed images and MIPs were obtained and reviewed to evaluate the vascular anatomy. RADIATION DOSE REDUCTION: This exam was performed according to the departmental dose-optimization program which includes automated exposure control, adjustment of the mA and/or kV according to patient size and/or use of iterative reconstruction technique. CONTRAST:  OMNIPAQUE IOHEXOL 350 MG/ML SOLN COMPARISON:  Chest x-ray from the previous day. FINDINGS: CTA CHEST FINDINGS Cardiovascular: Initial precontrast images demonstrate atherosclerotic calcifications. No hyperdense crescent to suggest acute aortic injury is noted. Post-contrast images demonstrate no aneurysmal dilatation or dissection. Heart is at the upper limits of normal in size. No coronary calcifications are seen. The pulmonary artery shows a normal branching pattern bilaterally. Multiple filling defects are noted throughout the right pulmonary arterial tree consistent with pulmonary emboli. Right ventricular dilatation consistent with right heart strain is noted. Mediastinum/Nodes: Thoracic inlet is within normal limits. No hilar or mediastinal adenopathy is noted. A few scattered calcified hilar nodes are noted consistent with prior granulomatous disease. The esophagus as visualized is within normal limits. Lungs/Pleura: Lungs are well aerated bilaterally. Bibasilar atelectatic changes are noted. No sizable effusion is seen. Musculoskeletal: Degenerative changes of the thoracic  spine are noted. No acute rib abnormality is seen. Review of the MIP images confirms the above findings. CTA ABDOMEN AND PELVIS FINDINGS VASCULAR Aorta: Abdominal aorta demonstrates atherosclerotic calcification and tortuosity without evidence of aneurysmal dilatation. Celiac: Origin of the celiac axis is within normal limits. There is a calcified somewhat tubular structure extending along the course of the hepatic artery of uncertain significance. This could be related to prior vascular intervention although no given history is noted. SMA: Patent without evidence of aneurysm, dissection, vasculitis or significant stenosis. Renals: Both renal arteries are patent without evidence of aneurysm, dissection, vasculitis, fibromuscular dysplasia or significant stenosis. IMA: Patent without evidence of aneurysm, dissection, vasculitis or significant stenosis. Inflow: Iliacs demonstrate mild atherosclerotic calcifications without aneurysmal dilatation. Veins: No specific venous abnormality is noted. Review of the MIP images confirms the above findings. NON-VASCULAR Hepatobiliary: Liver demonstrates mild fatty infiltration. Gallbladder is unremarkable. Pancreas: Unremarkable. No pancreatic ductal dilatation or surrounding  inflammatory changes. Spleen: Normal in size without focal abnormality. Adrenals/Urinary Tract: Adrenal glands are within normal limits. Kidneys demonstrate a normal enhancement pattern bilaterally. No renal calculi are seen. No obstructive changes are noted. The bladder is well distended. Stomach/Bowel: Considerable fecal material is noted within the rectal vault likely related to a degree of rectal impaction. No proximal obstructive changes are seen however. Colon shows scattered fecal material without obstructive change. The appendix is not well visualized although no inflammatory changes to suggest appendicitis are seen. Small bowel and stomach are unremarkable. Lymphatic: No significant lymphadenopathy  is seen. Reproductive: Prostate is unremarkable. Other: No abdominal wall hernia or abnormality. No abdominopelvic ascites. Musculoskeletal: Symmetrical sclerosis of the femoral heads is noted. No acute bony abnormality is noted. Degenerative changes of lumbar spine are seen. Review of the MIP images confirms the above findings. IMPRESSION: CTA of the chest: Positive for acute right PE with CT evidence of right heart strain (RV/LV Ratio = 1.1) consistent with at least submassive (intermediate risk) PE. The presence of right heart strain has been associated with an increased risk of morbidity and mortality. Please refer to the "Code PE Focused" order set in EPIC. Changes of prior granulomatous disease. Bibasilar atelectasis. CTA of the abdomen and pelvis: Fatty liver. Findings consistent with rectal impaction and mild colonic constipation. Tubular calcified structure overlying along the course of the hepatic artery which may represent a thrombosed vessel or possibly prior thrombosed graft. Correlate with clinical history. Critical Value/emergent results were called by telephone at the time of interpretation on 08/14/2022 at 2:25 am to Dr. Marily Memos , who verbally acknowledged these results. Electronically Signed   By: Alcide Clever M.D.   On: 08/14/2022 02:27   DG Chest 2 View  Result Date: 08/13/2022 CLINICAL DATA:  Cough, chest pain EXAM: CHEST - 2 VIEW COMPARISON:  None Available. FINDINGS: Bibasilar opacities, likely atelectasis. Heart is normal size. Aorta tortuous, calcified. Mediastinal contours within normal limits. No effusions or acute bony abnormality. IMPRESSION: Bibasilar opacities, likely atelectasis. Electronically Signed   By: Charlett Nose M.D.   On: 08/13/2022 22:06    Procedures .Critical Care  Performed by: Marily Memos, MD Authorized by: Marily Memos, MD   Critical care provider statement:    Critical care time (minutes):  30   Critical care was necessary to treat or prevent  imminent or life-threatening deterioration of the following conditions:  Circulatory failure   Critical care was time spent personally by me on the following activities:  Development of treatment plan with patient or surrogate, discussions with consultants, evaluation of patient's response to treatment, examination of patient, ordering and review of laboratory studies, ordering and review of radiographic studies, ordering and performing treatments and interventions, pulse oximetry, re-evaluation of patient's condition and review of old charts     Medications Ordered in ED Medications  heparin ADULT infusion 100 units/mL (25000 units/232mL) (1,200 Units/hr Intravenous New Bag/Given 08/14/22 0312)  fentaNYL (SUBLIMAZE) injection 50 mcg (50 mcg Intravenous Given 08/14/22 0141)  methocarbamol (ROBAXIN) tablet 500 mg (500 mg Oral Given 08/14/22 0141)  iohexol (OMNIPAQUE) 350 MG/ML injection 100 mL (100 mLs Intravenous Contrast Given 08/14/22 0154)  heparin bolus via infusion 5,000 Units (5,000 Units Intravenous Bolus from Bag 08/14/22 0312)    ED Course/ Medical Decision Making/ A&P                           Medical Decision Making Amount and/or Complexity of Data Reviewed  Labs: ordered. Radiology: ordered.  Risk Prescription drug management. Decision regarding hospitalization.   CT without dissection but hasPE with right heart strain. Intermediate PESI score and with the r heart strain will admit to hospitalist. Unsure of primary location. Trop/bnp not c/w massive PE criteria. Heparin started.    Final Clinical Impression(s) / ED Diagnoses Final diagnoses:  Other acute pulmonary embolism, unspecified whether acute cor pulmonale present [facility])    Rx / DC Orders ED Discharge Orders     None         Lezly Rumpf, Barbara Cower, MD 08/14/22 [phone]

## 2022-08-14 NOTE — ED Notes (Signed)
Bed linen changed and urinal provided

## 2022-08-14 NOTE — Progress Notes (Signed)
Dustin Chandler is an 89 y.o. male with medical history significant of T2DM, hypertension who presents to the emergency department due to 2-day onset of right-sided chest pain with radiation to the back.  He has been admitted with acute PE that appears submassive with right-sided heart strain on CT scan.  Noted to also have right femoral DVT and 2D echocardiogram is currently pending.  He is currently on room air and exhibiting stable vital signs.  Patient seen and evaluated at bedside with plans to continue heparin drip for now and transition to DOAC after echocardiogram review.  A.m. labs ordered.  Total care time: 25 minutes.

## 2022-08-14 NOTE — Progress Notes (Signed)
ANTICOAGULATION CONSULT NOTE - Initial Consult  Pharmacy Consult for Heparin  Indication: pulmonary embolus  Allergies  Allergen Reactions   Lisinopril Swelling    Lip swelling   Amlodipine    Penicillins     Has patient had a PCN reaction causing immediate rash, facial/tongue/throat swelling, SOB or lightheadedness with hypotension: No Has patient had a PCN reaction causing severe rash involving mucus membranes or skin necrosis: No Has patient had a PCN reaction that required hospitalization No Has patient had a PCN reaction occurring within the last 10 years: No  "Locked my knee up" If all of the above answers are "NO", then may proceed with Cephalosporin use.     Patient Measurements: Height: 5\' 10"  (177.8 cm) Weight: 82 kg (180 lb 12.4 oz) IBW/kg (Calculated) : 73  Vital Signs: Temp: 98.4 F (36.9 C) (08/27 0944) Temp Source: Oral (08/27 0944) BP: 167/86 (08/27 1100) Pulse Rate: 65 (08/27 1100)  Labs: Recent Labs    08/13/22 2131 08/14/22 0135 08/14/22 0337 08/14/22 1102  HGB 12.9*  --   --   --   HCT 40.7  --   --   --   PLT 114*  --   --   --   HEPARINUNFRC  --   --   --  0.94*  CREATININE 0.73  --   --   --   TROPONINIHS  --  4 5  --      Estimated Creatinine Clearance: 68.4 mL/min (by C-G formula based on SCr of 0.73 mg/dL).   Medical History: Past Medical History:  Diagnosis Date   Colon polyps 2011   Non cancerous    CVA (cerebral vascular accident) (HCC) 10/01/2021   Diabetes mellitus    Diverticulosis    Elevated PSA 2010   dr. 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 Starting heparin. Hgb 12.9, plts 114, renal function ok, PTA meds reviewed.  HL 0.94 supratherapeutic, no bleeding reported, MD mentions transitioning to  DOAC soon possibly  Goal of Therapy:  Heparin level 0.3-0.7 units/ml Monitor platelets by anticoagulation protocol: Yes   Plan:  Reduce heparin infusion to 900 units/hr  2200 Heparin level Daily CBC/Heparin level Monitor for bleeding  89, PharmD, [facility] Clinical Pharmacist

## 2022-08-14 NOTE — Progress Notes (Signed)
ANTICOAGULATION CONSULT NOTE - Initial Consult  Pharmacy Consult for Heparin  Indication: pulmonary embolus  Allergies  Allergen Reactions   Lisinopril Swelling    Lip swelling   Amlodipine    Penicillins     Has patient had a PCN reaction causing immediate rash, facial/tongue/throat swelling, SOB or lightheadedness with hypotension: No Has patient had a PCN reaction causing severe rash involving mucus membranes or skin necrosis: No Has patient had a PCN reaction that required hospitalization No Has patient had a PCN reaction occurring within the last 10 years: No  "Locked my knee up" If all of the above answers are "NO", then may proceed with Cephalosporin use.     Patient Measurements: Height: 5\' 10"  (177.8 cm) Weight: 82 kg (180 lb 12.4 oz) IBW/kg (Calculated) : 73  Vital Signs: Temp: 98.7 F (37.1 C) (08/27 0016) Temp Source: Oral (08/27 0016) BP: 184/94 (08/27 0330) Pulse Rate: 70 (08/27 0315)  Labs: Recent Labs    08/13/22 2131 08/14/22 0135  HGB 12.9*  --   HCT 40.7  --   PLT 114*  --   CREATININE 0.73  --   TROPONINIHS  --  4    Estimated Creatinine Clearance: 68.4 mL/min (by C-G formula based on SCr of 0.73 mg/dL).   Medical History: Past Medical History:  Diagnosis Date   Colon polyps 2011   Non cancerous    CVA (cerebral vascular accident) (HCC) 10/01/2021   Diabetes mellitus    Diverticulosis    Elevated PSA 2010   dr. 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 Starting heparin. Hgb 12.9, plts 114, renal function ok, PTA meds reviewed.  Goal of Therapy:  Heparin level 0.3-0.7 units/ml Monitor platelets by anticoagulation protocol: Yes   Plan:  Heparin 5000 units BOLUS Start heparin drip at 1200 units/hr 1100 Heparin  level Daily CBC/Heparin level Monitor for bleeding  88, PharmD, BCPS Clinical Pharmacist Phone: [phone]

## 2022-08-15 DIAGNOSIS — I2699 Other pulmonary embolism without acute cor pulmonale: Secondary | ICD-10-CM | POA: Diagnosis not present

## 2022-08-15 LAB — COMPREHENSIVE METABOLIC PANEL
ALT: 13 U/L (ref 0–44)
AST: 18 U/L (ref 15–41)
Albumin: 3.2 g/dL — ABNORMAL LOW (ref 3.5–5.0)
Alkaline Phosphatase: 52 U/L (ref 38–126)
Anion gap: 7 (ref 5–15)
BUN: 15 mg/dL (ref 8–23)
CO2: 25 mmol/L (ref 22–32)
Calcium: 9.5 mg/dL (ref 8.9–10.3)
Chloride: 107 mmol/L (ref 98–111)
Creatinine, Ser: 0.62 mg/dL (ref 0.61–1.24)
GFR, Estimated: >60 mL/min (ref >60)
Glucose, Bld: 104 mg/dL — ABNORMAL HIGH (ref 70–99)
Potassium: 3.4 mmol/L — ABNORMAL LOW (ref 3.5–5.1)
Sodium: 139 mmol/L (ref 135–145)
Total Bilirubin: 1.5 mg/dL — ABNORMAL HIGH (ref 0.3–1.2)
Total Protein: 6.5 g/dL (ref 6.5–8.1)

## 2022-08-15 LAB — CBC
HCT: 38.5 % — ABNORMAL LOW (ref 39.0–52.0)
Hemoglobin: 12.4 g/dL — ABNORMAL LOW (ref 13.0–17.0)
MCH: 29 pg (ref 26.0–34.0)
MCHC: 32.2 g/dL (ref 30.0–36.0)
MCV: 90.2 fL (ref 80.0–100.0)
Platelets: 105 10*3/uL — ABNORMAL LOW (ref 150–400)
RBC: 4.27 MIL/uL (ref 4.22–5.81)
RDW: 13.8 % (ref 11.5–15.5)
WBC: 6 10*3/uL (ref 4.0–10.5)
nRBC: 0 % (ref 0.0–0.2)

## 2022-08-15 LAB — GLUCOSE, CAPILLARY: Glucose-Capillary: 100 mg/dL — ABNORMAL HIGH (ref 70–99)

## 2022-08-15 LAB — HEPARIN LEVEL (UNFRACTIONATED): Heparin Unfractionated: 0.71 IU/mL — ABNORMAL HIGH (ref 0.30–0.70)

## 2022-08-15 LAB — MAGNESIUM: Magnesium: 2.1 mg/dL (ref 1.7–2.4)

## 2022-08-15 MED ORDER — HYDRALAZINE HCL 20 MG/ML IJ SOLN: 10.0000 mg | INTRAMUSCULAR | Status: DC | PRN

## 2022-08-15 MED ORDER — APIXABAN 5 MG PO TABS: 5.0000 mg | ORAL_TABLET | Freq: Two times a day (BID) | ORAL | Status: DC

## 2022-08-15 MED ORDER — APIXABAN 5 MG PO TABS: 5.0000 mg | ORAL_TABLET | Freq: Two times a day (BID) | ORAL | 0 refills | Status: DC

## 2022-08-15 MED ORDER — HYDRALAZINE HCL 25 MG PO TABS
25.0000 mg | ORAL_TABLET | Freq: Three times a day (TID) | ORAL | Status: DC
  Administered 2022-08-15: 25 mg via ORAL
  Filled 2022-08-15: qty 1

## 2022-08-15 MED ORDER — APIXABAN 5 MG PO TABS
10.0000 mg | ORAL_TABLET | Freq: Two times a day (BID) | ORAL | Status: DC
  Administered 2022-08-15: 10 mg via ORAL
  Filled 2022-08-15: qty 2

## 2022-08-15 MED ORDER — POTASSIUM CHLORIDE CRYS ER 20 MEQ PO TBCR
40.0000 meq | EXTENDED_RELEASE_TABLET | Freq: Once | ORAL | Status: AC
  Administered 2022-08-15: 40 meq via ORAL
  Filled 2022-08-15: qty 2

## 2022-08-15 MED ORDER — RIVAROXABAN (XARELTO) VTE STARTER PACK (15 & 20 MG): ORAL_TABLET | ORAL | 0 refills | Status: DC

## 2022-08-15 MED ORDER — APIXABAN 5 MG PO TABS: 10.0000 mg | ORAL_TABLET | Freq: Two times a day (BID) | ORAL | 0 refills | Status: DC

## 2022-08-15 NOTE — Plan of Care (Signed)
Problem: Education: Goal: Ability to describe self-care measures that may prevent or decrease complications (Diabetes Survival Skills Education) will improve Outcome: Adequate for Discharge Goal: Individualized Educational Video(s) Outcome: Adequate for Discharge   Problem: Coping: Goal: Ability to adjust to condition or change in health will improve Outcome: Adequate for Discharge   Problem: Fluid Volume: Goal: Ability to maintain a balanced intake and output will improve Outcome: Adequate for Discharge   Problem: Health Behavior/Discharge Planning: Goal: Ability to identify and utilize available resources and services will improve Outcome: Adequate for Discharge Goal: Ability to manage health-related needs will improve Outcome: Adequate for Discharge   Problem: Metabolic: Goal: Ability to maintain appropriate glucose levels will improve Outcome: Adequate for Discharge   Problem: Nutritional: Goal: Maintenance of adequate nutrition will improve Outcome: Adequate for Discharge Goal: Progress toward achieving an optimal weight will improve Outcome: Adequate for Discharge   Problem: Skin Integrity: Goal: Risk for impaired skin integrity will decrease Outcome: Adequate for Discharge   Problem: Tissue Perfusion: Goal: Adequacy of tissue perfusion will improve Outcome: Adequate for Discharge   Problem: Education: Goal: Knowledge of General Education information will improve Description: Including pain rating scale, medication(s)/side effects and non-pharmacologic comfort measures Outcome: Adequate for Discharge   Problem: Health Behavior/Discharge Planning: Goal: Ability to manage health-related needs will improve Outcome: Adequate for Discharge   Problem: Clinical Measurements: Goal: Ability to maintain clinical measurements within normal limits will improve Outcome: Adequate for Discharge Goal: Will remain free from infection Outcome: Adequate for Discharge Goal:  Diagnostic test results will improve Outcome: Adequate for Discharge Goal: Respiratory complications will improve Outcome: Adequate for Discharge Goal: Cardiovascular complication will be avoided Outcome: Adequate for Discharge   Problem: Activity: Goal: Risk for activity intolerance will decrease Outcome: Adequate for Discharge   Problem: Nutrition: Goal: Adequate nutrition will be maintained Outcome: Adequate for Discharge   Problem: Coping: Goal: Level of anxiety will decrease Outcome: Adequate for Discharge   Problem: Elimination: Goal: Will not experience complications related to bowel motility Outcome: Adequate for Discharge Goal: Will not experience complications related to urinary retention Outcome: Adequate for Discharge   Problem: Pain Managment: Goal: General experience of comfort will improve Outcome: Adequate for Discharge   Problem: Safety: Goal: Ability to remain free from injury will improve Outcome: Adequate for Discharge   Problem: Skin Integrity: Goal: Risk for impaired skin integrity will decrease Outcome: Adequate for Discharge   Problem: Education: Goal: Knowledge of General Education information will improve Description: Including pain rating scale, medication(s)/side effects and non-pharmacologic comfort measures Outcome: Adequate for Discharge   Problem: Health Behavior/Discharge Planning: Goal: Ability to manage health-related needs will improve Outcome: Adequate for Discharge   Problem: Clinical Measurements: Goal: Ability to maintain clinical measurements within normal limits will improve Outcome: Adequate for Discharge Goal: Will remain free from infection Outcome: Adequate for Discharge Goal: Diagnostic test results will improve Outcome: Adequate for Discharge Goal: Respiratory complications will improve Outcome: Adequate for Discharge Goal: Cardiovascular complication will be avoided Outcome: Adequate for Discharge   Problem:  Activity: Goal: Risk for activity intolerance will decrease Outcome: Adequate for Discharge   Problem: Nutrition: Goal: Adequate nutrition will be maintained Outcome: Adequate for Discharge   Problem: Coping: Goal: Level of anxiety will decrease Outcome: Adequate for Discharge   Problem: Elimination: Goal: Will not experience complications related to bowel motility Outcome: Adequate for Discharge Goal: Will not experience complications related to urinary retention Outcome: Adequate for Discharge   Problem: Pain Managment: Goal: General experience of comfort   will improve Outcome: Adequate for Discharge   Problem: Safety: Goal: Ability to remain free from injury will improve Outcome: Adequate for Discharge   Problem: Skin Integrity: Goal: Risk for impaired skin integrity will decrease Outcome: Adequate for Discharge   

## 2022-08-15 NOTE — Discharge Summary (Addendum)
Physician Discharge Summary  AZARIAN STARACE GUY:[id number] DOB: 1934/10/18 DOA: 08/14/2022  PCP: [facility]  Admit date: 08/14/2022  Discharge date: 08/15/2022  Admitted From:Home  Disposition:  Home  Recommendations for Outpatient Follow-up:  Follow up with PCP in 1-2 weeks Follow-up with hematology as recommended due to recurrence of DVT/PE Prescribed Xarelto Discontinue home aspirin Continue other home medications as prior  Home Health: None  Equipment/Devices: None  Discharge Condition:Stable  CODE STATUS: Full  Diet recommendation: Heart Healthy/carb modified  Brief/Interim Summary:  Dustin Chandler is an 88 y.o. male with medical history significant of T2DM, hypertension who presents to the emergency department due to 2-day onset of right-sided chest pain with radiation to the back.  He has been admitted with acute PE that appears submassive with right-sided heart strain on CT scan.  Noted to also have right femoral DVT and 2D echocardiogram with LVEF 60-65% and grade 1 diastolic dysfunction with no significant RV strain noted.  He is off his oxygen and in stable condition for discharge today.  His vital signs of remained stable.  No other acute events noted.  Discharge Diagnoses:  Principal Problem:   Acute pulmonary embolism (HCC) Active Problems:   Essential hypertension   DM (diabetes mellitus) (HCC)   Dyslipidemia   Pleuritic chest pain   Mild bibasilar atelectasis   Thrombocytopenia (HCC)   Elevated brain natriuretic peptide (BNP) level  Principal discharge diagnosis: Acute pulmonary embolism along with right femoral vein DVT.  Discharge Instructions  Discharge Instructions     Diet - low sodium heart healthy   Complete by: As directed    Increase activity slowly   Complete by: As directed       Allergies as of 08/15/2022       Reactions   Lisinopril Swelling   Lip swelling   Amlodipine    Penicillins    Has patient had a PCN  reaction causing immediate rash, facial/tongue/throat swelling, SOB or lightheadedness with hypotension: No Has patient had a PCN reaction causing severe rash involving mucus membranes or skin necrosis: No Has patient had a PCN reaction that required hospitalization No Has patient had a PCN reaction occurring within the last 10 years: No "Locked my knee up" If all of the above answers are "NO", then may proceed with Cephalosporin use.        Medication List     STOP taking these medications    aspirin 325 MG tablet   predniSONE 10 MG tablet Commonly known as: DELTASONE       TAKE these medications    apixaban 5 MG Tabs tablet Commonly known as: ELIQUIS Take 2 tablets (10 mg total) by mouth 2 (two) times daily for 7 days.   apixaban 5 MG Tabs tablet Commonly known as: ELIQUIS Take 1 tablet (5 mg total) by mouth 2 (two) times daily. Start taking on: August 22, 2022   atorvastatin 40 MG tablet Commonly known as: LIPITOR Take 1 tablet (40 mg total) by mouth every evening.   cetirizine 10 MG tablet Commonly known as: ZYRTEC Take 10 mg by mouth daily as needed.   cholecalciferol 1000 units tablet Commonly known as: VITAMIN D Take 1,000 Units by mouth daily.   cloNIDine 0.1 MG tablet Commonly known as: CATAPRES Take 0.1 mg by mouth daily.   cyanocobalamin 1000 MCG tablet Commonly known as: VITAMIN B12 Take 1 tablet (1,000 mcg total) by mouth daily.   hydrALAZINE 25 MG tablet Commonly known as: APRESOLINE Take  1 tablet (25 mg total) by mouth in the morning and at bedtime.   metFORMIN 500 MG 24 hr tablet Commonly known as: GLUCOPHAGE-XR Take 500 mg by mouth daily.   OVER THE COUNTER MEDICATION Take 1 tablet by mouth daily. Curemed   QC TUMERIC COMPLEX PO Take 1 tablet by mouth daily.   Rivaroxaban Stater Pack (15 mg and 20 mg) Commonly known as: XARELTO STARTER PACK Follow package directions: Take one  tablet by mouth twice a day. On day 22, switch  to one  tablet once a day. Take with food.   thiamine 100 MG tablet Commonly known as: Vitamin B-1 Take 100 mg by mouth daily.   Vitamin D3 Liqd 1 drop by Does not apply route daily.   zinc gluconate 50 MG tablet Take 1 tablet by mouth daily.        Follow-up Information     [facility] Schedule an appointment as soon as possible for a visit in 1 week(s).   Contact information: [address] [facility] [place] [address] [phone]                Allergies  Allergen Reactions   Lisinopril Swelling    Lip swelling   Amlodipine    Penicillins     Has patient had a PCN reaction causing immediate rash, facial/tongue/throat swelling, SOB or lightheadedness with hypotension: No Has patient had a PCN reaction causing severe rash involving mucus membranes or skin necrosis: No Has patient had a PCN reaction that required hospitalization No Has patient had a PCN reaction occurring within the last 10 years: No  "Locked my knee up" If all of the above answers are "NO", then may proceed with Cephalosporin use.     Consultations: None   Procedures/Studies: ECHOCARDIOGRAM COMPLETE  Result Date: 08/14/2022    ECHOCARDIOGRAM REPORT   Patient Name:   Dustin Chandler Date of Exam: 08/14/2022 Medical Rec #:  [id number]          Height:       70.0 in Accession #:    [id number]         Weight:       180.8 lb Date of Birth:  March 02, 1934           BSA:          2.000 m Patient Age:    88 years           BP:           135/71 mmHg Patient Gender: M                  HR:           60 bpm. [facility] Procedure: 2D Echo, Color Doppler and Cardiac Doppler Indications:    I26.02 Pulmonary embolus  History:        Patient has prior history of Echocardiogram examinations, most                 recent 10/02/2021. Risk Factors:Hypertension and Diabetes.  Sonographer:    Irving Burton Senior RDCS Referring Phys: [id number] OLADAPO ADEFESO IMPRESSIONS  1. Left  ventricular ejection fraction, by estimation, is 60 to 65%. The left ventricle has normal function. The left ventricle has no regional wall motion abnormalities. Left ventricular diastolic parameters are consistent with Grade I diastolic dysfunction (impaired relaxation).  2. Right ventricular systolic function is normal. The right ventricular size is normal. Tricuspid regurgitation signal is inadequate for  assessing PA pressure.  3. The mitral valve is normal in structure. No evidence of mitral valve regurgitation.  4. Aortic valve regurgitation is not visualized.  5. There is mild dilatation of the ascending aorta, measuring 40 mm.  6. The inferior vena cava is normal in size with greater than 50% respiratory variability, suggesting right atrial pressure of 3 mmHg. Comparison(s): No significant change from prior study. FINDINGS  Left Ventricle: Left ventricular ejection fraction, by estimation, is 60 to 65%. The left ventricle has normal function. The left ventricle has no regional wall motion abnormalities. The left ventricular internal cavity size was normal in size. There is  no left ventricular hypertrophy. Left ventricular diastolic parameters are consistent with Grade I diastolic dysfunction (impaired relaxation). Right Ventricle: The right ventricular size is normal. Right ventricular systolic function is normal. Tricuspid regurgitation signal is inadequate for assessing PA pressure. Left Atrium: Left atrial size was normal in size. Right Atrium: Right atrial size was normal in size. Pericardium: There is no evidence of pericardial effusion. Mitral Valve: The mitral valve is normal in structure. No evidence of mitral valve regurgitation. Tricuspid Valve: Tricuspid valve regurgitation is not demonstrated. Aortic Valve: Aortic valve regurgitation is not visualized. Pulmonic Valve: Pulmonic valve regurgitation is not visualized. Aorta: There is mild dilatation of the ascending aorta, measuring 40 mm. Venous:  The inferior vena cava is normal in size with greater than 50% respiratory variability, suggesting right atrial pressure of 3 mmHg.  LEFT VENTRICLE PLAX 2D LVIDd:         4.60 cm   Diastology LVIDs:         2.90 cm   LV e' medial:    6.64 cm/s LV PW:         1.10 cm   LV E/e' medial:  9.2 LV IVS:        0.90 cm   LV e' lateral:   6.53 cm/s LVOT diam:     2.30 cm   LV E/e' lateral: 9.4 LV SV:         71 LV SV Index:   36 LVOT Area:     4.15 cm  RIGHT VENTRICLE RV S prime:     8.59 cm/s TAPSE (M-mode): 1.7 cm LEFT ATRIUM             Index        RIGHT ATRIUM           Index LA diam:        3.00 cm 1.50 cm/m   RA Area:     11.00 cm LA Vol (A2C):   51.3 ml 25.66 ml/m  RA Volume:   22.50 ml  11.25 ml/m LA Vol (A4C):   44.3 ml 22.15 ml/m LA Biplane Vol: 48.9 ml 24.46 ml/m  AORTIC VALVE LVOT Vmax:   84.20 cm/s LVOT Vmean:  60.400 cm/s LVOT VTI:    0.172 m  AORTA Ao Root diam: 4.00 cm Ao Asc diam:  4.00 cm MITRAL VALVE MV Area (PHT): 2.27 cm    SHUNTS MV Decel Time: 334 msec    Systemic VTI:  0.17 m MV E velocity: 61.30 cm/s  Systemic Diam: 2.30 cm MV A velocity: 71.60 cm/s MV E/A ratio:  0.86 [place] signed by Carolan Clines Signature Date/Time: 08/14/2022/4:33:56 PM    Final    US Venous Img Lower Bilateral (DVT)  Result Date: 08/14/2022 CLINICAL DATA:  Pain and swelling, PE EXAM: BILATERAL LOWER EXTREMITY VENOUS DOPPLER ULTRASOUND TECHNIQUE: Gray-scale  sonography with graded compression, as well as color Doppler and duplex ultrasound were performed to evaluate the lower extremity deep venous systems from the level of the common femoral vein and including the common femoral, femoral, profunda femoral, popliteal and calf veins including the posterior tibial, peroneal and gastrocnemius veins when visible. The superficial great saphenous vein was also interrogated. Spectral Doppler was utilized to evaluate flow at rest and with distal augmentation maneuvers in the common femoral, femoral and  popliteal veins. COMPARISON:  CT chest done earlier today FINDINGS: RIGHT LOWER EXTREMITY Common Femoral Vein: No evidence of thrombus. Normal compressibility, respiratory phasicity and response to augmentation. Saphenofemoral Junction: No evidence of thrombus. Normal compressibility and flow on color Doppler imaging. Profunda Femoral Vein: No evidence of thrombus. Normal compressibility and flow on color Doppler imaging. Femoral Vein: There is evidence of nonocclusive DVT in the mid course of right femoral vein. Popliteal Vein: No evidence of thrombus. Normal compressibility, respiratory phasicity and response to augmentation. Calf Veins: No evidence of thrombus. Normal compressibility and flow on color Doppler imaging. Superficial Great Saphenous Vein: No evidence of thrombus. Normal compressibility. Venous Reflux:  None. Other Findings:  There is edema in subcutaneous plane in the calf. LEFT LOWER EXTREMITY Common Femoral Vein: No evidence of thrombus. Normal compressibility, respiratory phasicity and response to augmentation. Saphenofemoral Junction: No evidence of thrombus. Normal compressibility and flow on color Doppler imaging. Profunda Femoral Vein: No evidence of thrombus. Normal compressibility and flow on color Doppler imaging. Femoral Vein: No evidence of thrombus. Normal compressibility, respiratory phasicity and response to augmentation. Popliteal Vein: No evidence of thrombus. Normal compressibility, respiratory phasicity and response to augmentation. Calf Veins: No evidence of thrombus. Normal compressibility and flow on color Doppler imaging. Superficial Great Saphenous Vein: No evidence of thrombus. Normal compressibility. Venous Reflux:  None. Other Findings:  There is edema in subcutaneous plane. IMPRESSION: There is acute nonocclusive DVT in the mid course of right femoral vein. Rest of the major deep veins in right lower extremity and all the major deep veins in the left lower extremity show  no signs of acute DVT. There is edema in subcutaneous plane in both calves without any loculated fluid collections. Electronically Signed   By: Ernie AvenaPalani  Rathinasamy M.D.   On: 08/14/2022 10:26   CT Angio Chest/Abd/Pel for Dissection W and/or Wo Contrast  Result Date: 08/14/2022 CLINICAL DATA:  Right-sided chest pain EXAM: CT ANGIOGRAPHY CHEST, ABDOMEN AND PELVIS TECHNIQUE: Non-contrast CT of the chest was initially obtained. Multidetector CT imaging through the chest, abdomen and pelvis was performed using the standard protocol during bolus administration of intravenous contrast. Multiplanar reconstructed images and MIPs were obtained and reviewed to evaluate the vascular anatomy. RADIATION DOSE REDUCTION: This exam was performed according to the departmental dose-optimization program which includes automated exposure control, adjustment of the mA and/or kV according to patient size and/or use of iterative reconstruction technique. CONTRAST:  100mL OMNIPAQUE IOHEXOL 350 MG/ML SOLN COMPARISON:  Chest x-ray from the previous day. FINDINGS: CTA CHEST FINDINGS Cardiovascular: Initial precontrast images demonstrate atherosclerotic calcifications. No hyperdense crescent to suggest acute aortic injury is noted. Post-contrast images demonstrate no aneurysmal dilatation or dissection. Heart is at the upper limits of normal in size. No coronary calcifications are seen. The pulmonary artery shows a normal branching pattern bilaterally. Multiple filling defects are noted throughout the right pulmonary arterial tree consistent with pulmonary emboli. Right ventricular dilatation consistent with right heart strain is noted. Mediastinum/Nodes: Thoracic inlet is within normal limits. No hilar or  mediastinal adenopathy is noted. A few scattered calcified hilar nodes are noted consistent with prior granulomatous disease. The esophagus as visualized is within normal limits. Lungs/Pleura: Lungs are well aerated bilaterally. Bibasilar  atelectatic changes are noted. No sizable effusion is seen. Musculoskeletal: Degenerative changes of the thoracic spine are noted. No acute rib abnormality is seen. Review of the MIP images confirms the above findings. CTA ABDOMEN AND PELVIS FINDINGS VASCULAR Aorta: Abdominal aorta demonstrates atherosclerotic calcification and tortuosity without evidence of aneurysmal dilatation. Celiac: Origin of the celiac axis is within normal limits. There is a calcified somewhat tubular structure extending along the course of the hepatic artery of uncertain significance. This could be related to prior vascular intervention although no given history is noted. SMA: Patent without evidence of aneurysm, dissection, vasculitis or significant stenosis. Renals: Both renal arteries are patent without evidence of aneurysm, dissection, vasculitis, fibromuscular dysplasia or significant stenosis. IMA: Patent without evidence of aneurysm, dissection, vasculitis or significant stenosis. Inflow: Iliacs demonstrate mild atherosclerotic calcifications without aneurysmal dilatation. Veins: No specific venous abnormality is noted. Review of the MIP images confirms the above findings. NON-VASCULAR Hepatobiliary: Liver demonstrates mild fatty infiltration. Gallbladder is unremarkable. Pancreas: Unremarkable. No pancreatic ductal dilatation or surrounding inflammatory changes. Spleen: Normal in size without focal abnormality. Adrenals/Urinary Tract: Adrenal glands are within normal limits. Kidneys demonstrate a normal enhancement pattern bilaterally. No renal calculi are seen. No obstructive changes are noted. The bladder is well distended. Stomach/Bowel: Considerable fecal material is noted within the rectal vault likely related to a degree of rectal impaction. No proximal obstructive changes are seen however. Colon shows scattered fecal material without obstructive change. The appendix is not well visualized although no inflammatory changes to  suggest appendicitis are seen. Small bowel and stomach are unremarkable. Lymphatic: No significant lymphadenopathy is seen. Reproductive: Prostate is unremarkable. Other: No abdominal wall hernia or abnormality. No abdominopelvic ascites. Musculoskeletal: Symmetrical sclerosis of the femoral heads is noted. No acute bony abnormality is noted. Degenerative changes of lumbar spine are seen. Review of the MIP images confirms the above findings. IMPRESSION: CTA of the chest: Positive for acute right PE with CT evidence of right heart strain (RV/LV Ratio = 1.1) consistent with at least submassive (intermediate risk) PE. The presence of right heart strain has been associated with an increased risk of morbidity and mortality. Please refer to the "Code PE Focused" order set in EPIC. Changes of prior granulomatous disease. Bibasilar atelectasis. CTA of the abdomen and pelvis: Fatty liver. Findings consistent with rectal impaction and mild colonic constipation. Tubular calcified structure overlying along the course of the hepatic artery which may represent a thrombosed vessel or possibly prior thrombosed graft. Correlate with clinical history. Critical Value/emergent results were called by telephone at the time of interpretation on 08/14/2022 at 2:25 am to Dr. Marily Memos , who verbally acknowledged these results. Electronically Signed   By: Alcide Clever M.D.   On: 08/14/2022 02:27   DG Chest 2 View  Result Date: 08/13/2022 CLINICAL DATA:  Cough, chest pain EXAM: CHEST - 2 VIEW COMPARISON:  None Available. FINDINGS: Bibasilar opacities, likely atelectasis. Heart is normal size. Aorta tortuous, calcified. Mediastinal contours within normal limits. No effusions or acute bony abnormality. IMPRESSION: Bibasilar opacities, likely atelectasis. Electronically Signed   By: Charlett Nose M.D.   On: 08/13/2022 22:06     Discharge Exam: Vitals:   08/15/22 0530 08/15/22 0710  BP: (!) 177/91 (!) 177/110  Pulse: 68 78  Resp: (!)  23 18  Temp:  98.8 F (37.1 C)  SpO2: 95% 96%   Vitals:   08/15/22 0400 08/15/22 0530 08/15/22 0700 08/15/22 0710  BP: (!) 154/83 (!) 177/91  (!) 177/110  Pulse: 63 68  78  Resp: 17 (!) 23  18  Temp:    98.8 F (37.1 C)  TempSrc:    Oral  SpO2: 94% 95%  96%  Weight:   75.1 kg   Height:   6' (1.829 m)     General: Pt is alert, awake, not in acute distress Cardiovascular: RRR, S1/S2 +, no rubs, no gallops Respiratory: CTA bilaterally, no wheezing, no rhonchi Abdominal: Soft, NT, ND, bowel sounds + Extremities: no edema, no cyanosis    The results of significant diagnostics from this hospitalization (including imaging, microbiology, ancillary and laboratory) are listed below for reference.     Microbiology: No results found for this or any previous visit (from the past 240 hour(s)).   Labs: BNP (last 3 results) Recent Labs    08/14/22 0135  BNP 114.0*   Basic Metabolic Panel: Recent Labs  Lab 08/13/22 2131 08/14/22 0135 08/15/22 0532  NA 140  --  139  K 3.7  --  3.4*  CL 107  --  107  CO2 24  --  25  GLUCOSE 124*  --  104*  BUN 21  --  15  CREATININE 0.73  --  0.62  CALCIUM 10.1  --  9.5  MG  --  <0.5* 2.1  PHOS  --  2.7  --    Liver Function Tests: Recent Labs  Lab 08/15/22 0532  AST 18  ALT 13  ALKPHOS 52  BILITOT 1.5*  PROT 6.5  ALBUMIN 3.2*   No results for input(s): "LIPASE", "AMYLASE" in the last 168 hours. No results for input(s): "AMMONIA" in the last 168 hours. CBC: Recent Labs  Lab 08/13/22 2131 08/15/22 0532  WBC 6.8 6.0  NEUTROABS 4.8  --   HGB 12.9* 12.4*  HCT 40.7 38.5*  MCV 92.5 90.2  PLT 114* 105*   Cardiac Enzymes: No results for input(s): "CKTOTAL", "CKMB", "CKMBINDEX", "TROPONINI" in the last 168 hours. BNP: Invalid input(s): "POCBNP" CBG: Recent Labs  Lab 08/14/22 0817 08/14/22 1146 08/14/22 1647 08/14/22 2205 08/15/22 0910  GLUCAP 119* 143* 108* 101* 100*   D-Dimer No results for input(s): "DDIMER" in  the last 72 hours. Hgb A1c Recent Labs    08/14/22 0730  HGBA1C 6.1*   Lipid Profile No results for input(s): "CHOL", "HDL", "LDLCALC", "TRIG", "CHOLHDL", "LDLDIRECT" in the last 72 hours. Thyroid function studies No results for input(s): "TSH", "T4TOTAL", "T3FREE", "THYROIDAB" in the last 72 hours.  Invalid input(s): "FREET3" Anemia work up No results for input(s): "VITAMINB12", "FOLATE", "FERRITIN", "TIBC", "IRON", "RETICCTPCT" in the last 72 hours. Urinalysis    Component Value Date/Time   COLORURINE STRAW (A) 10/01/2021 1451   APPEARANCEUR CLEAR 10/01/2021 1451   LABSPEC 1.006 10/01/2021 1451   PHURINE 7.0 10/01/2021 1451   GLUCOSEU NEGATIVE 10/01/2021 1451   HGBUR NEGATIVE 10/01/2021 1451   BILIRUBINUR NEGATIVE 10/01/2021 1451   KETONESUR NEGATIVE 10/01/2021 1451   PROTEINUR NEGATIVE 10/01/2021 1451   NITRITE NEGATIVE 10/01/2021 1451   LEUKOCYTESUR NEGATIVE 10/01/2021 1451   Sepsis Labs Recent Labs  Lab 08/13/22 2131 08/15/22 0532  WBC 6.8 6.0   Microbiology No results found for this or any previous visit (from the past 240 hour(s)).   Time coordinating discharge: 35 minutes  SIGNED:   Erick Blinks, DO [facility]  08/15/2022, 3:25 PM  If 7PM-7AM, please contact night-coverage [email or web address]

## 2024-04-02 ENCOUNTER — Other Ambulatory Visit (HOSPITAL_COMMUNITY): Payer: Self-pay | Admitting: Internal Medicine

## 2024-04-02 DIAGNOSIS — R0600 Dyspnea, unspecified: Secondary | ICD-10-CM

## 2024-04-04 ENCOUNTER — Ambulatory Visit (HOSPITAL_COMMUNITY)
Admission: RE | Admit: 2024-04-04 | Discharge: 2024-04-04 | Disposition: A | Discharge status: Home / Self Care | Source: Ambulatory Visit | Attending: Internal Medicine | Admitting: Internal Medicine

## 2024-04-04 DIAGNOSIS — R0600 Dyspnea, unspecified: Secondary | ICD-10-CM | POA: Diagnosis present

## 2024-04-04 MED ORDER — REGADENOSON 0.4 MG/5ML IV SOLN
INTRAVENOUS | Status: AC
  Filled 2024-04-04: qty 5

## 2024-04-04 MED ORDER — TECHNETIUM TC 99M TETROFOSMIN IV KIT
32.3000 | PACK | Freq: Once | INTRAVENOUS | Status: AC | PRN
  Administered 2024-04-04: 32.3 via INTRAVENOUS

## 2024-04-04 MED ORDER — REGADENOSON 0.4 MG/5ML IV SOLN
0.4000 mg | Freq: Once | INTRAVENOUS | Status: AC
  Administered 2024-04-04: 0.4 mg via INTRAVENOUS

## 2024-04-04 MED ORDER — TECHNETIUM TC 99M TETROFOSMIN IV KIT
10.0000 | PACK | Freq: Once | INTRAVENOUS | Status: AC | PRN
Start: 2024-04-04 — End: 2024-04-04
  Administered 2024-04-04: 10 via INTRAVENOUS

## 2024-05-05 ENCOUNTER — Ambulatory Visit

## 2024-05-05 ENCOUNTER — Other Ambulatory Visit: Payer: Self-pay

## 2024-05-05 ENCOUNTER — Ambulatory Visit
Admission: EM | Admit: 2024-05-05 | Discharge: 2024-05-05 | Disposition: A | Discharge status: Home / Self Care | Attending: Family Medicine | Admitting: Family Medicine

## 2024-05-05 DIAGNOSIS — R531 Weakness: Secondary | ICD-10-CM

## 2024-05-05 DIAGNOSIS — J22 Unspecified acute lower respiratory infection: Secondary | ICD-10-CM

## 2024-05-05 DIAGNOSIS — R0602 Shortness of breath: Secondary | ICD-10-CM | POA: Diagnosis not present

## 2024-05-05 DIAGNOSIS — R5383 Other fatigue: Secondary | ICD-10-CM

## 2024-05-05 MED ORDER — GUAIFENESIN ER 600 MG PO TB12: 600.0000 mg | ORAL_TABLET | Freq: Two times a day (BID) | ORAL | 0 refills | Status: DC | PRN

## 2024-05-05 MED ORDER — AZITHROMYCIN 250 MG PO TABS: ORAL_TABLET | ORAL | 0 refills | Status: DC

## 2024-05-05 NOTE — ED Triage Notes (Signed)
 Per daughter, pt has a cough, SOB, and fatigue x 1 week   Daughter reports pt would be eating his meal and then falls asleep in the middle of eating.  Pt has bilateral leg swelling.   Took mucinex

## 2024-05-05 NOTE — ED Provider Notes (Signed)
 [facility]    CSN: [id number] Arrival date & time: 05/05/24  1213      History   Chief Complaint No chief complaint on file.   HPI Dustin Chandler is a 89 y.o. male.   Patient presenting today with daughter for evaluation of 1 week history of cough, shortness of breath, fatigue, congestion, chills, sweats.  Denies chest pain, abdominal pain, vomiting, diarrhea, sore throat, confusion, new weakness numbness or tingling.  So far trying Mucinex for the past 2 days with minimal relief.  No known history of COPD or asthma but is on Eliquis  for a history of pulmonary embolism.  Daughter states he has been more groggy than usual the past week since getting sick.    Past Medical History:  Diagnosis Date   Colon polyps 2011   Non cancerous    CVA (cerebral vascular accident) (HCC) 10/01/2021   Diabetes mellitus    Diverticulosis    Elevated PSA 2010   dr. 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    Patient Active Problem List   Diagnosis Date Noted   Acute pulmonary embolism (HCC) 08/14/2022   Pleuritic chest pain 08/14/2022   Mild bibasilar atelectasis 08/14/2022   Thrombocytopenia (HCC) 08/14/2022   Elevated brain natriuretic peptide (BNP) level 08/14/2022   Stroke (cerebrum) (HCC) 10/01/2021   Dyslipidemia 11/07/2012   Routine general medical examination at a health care facility 07/01/2012   DM (diabetes mellitus) (HCC) 01/01/2012   Allergic rhinitis 02/11/2010   PROSTATE SPECIFIC ANTIGEN, ELEVATED 09/20/2009   Overweight (BMI 25.0-29.9) 07/18/2009   FATIGUE 07/17/2009   Essential hypertension 07/18/2008    Past Surgical History:  Procedure Laterality Date   CYSTOSTOMY W/ BLADDER BIOPSY     removal of benign mole     15 years ago       Home Medications    Prior to Admission medications   Medication Sig Start Date End Date Taking? Authorizing  Provider  azithromycin (ZITHROMAX) 250 MG tablet Take first 2 tablets together, then 1 every day until finished. 05/05/24  Yes Corbin Dess, PA-C  guaiFENesin (MUCINEX) 600 MG 12 hr tablet Take 1 tablet (600 mg total) by mouth 2 (two) times daily as needed. 05/05/24  Yes Corbin Dess, PA-C  apixaban  (ELIQUIS ) 5 MG TABS tablet Take 2 tablets (10 mg total) by mouth 2 (two) times daily for 7 days. 08/15/22 08/22/22  Mason Sole, Pratik D, DO  apixaban  (ELIQUIS ) 5 MG TABS tablet Take 1 tablet (5 mg total) by mouth 2 (two) times daily. 08/22/22 11/20/22  Mason Sole, Pratik D, DO  atorvastatin  (LIPITOR) 40 MG tablet Take 1 tablet (40 mg total) by mouth every evening. 10/03/21   Ozell Blunt, MD  cetirizine (ZYRTEC) 10 MG tablet Take 10 mg by mouth daily as needed. 04/01/15   [provider]  cholecalciferol (VITAMIN D) 1000 units tablet Take 1,000 Units by mouth daily.    [provider]  Cholecalciferol (VITAMIN D3) LIQD 1 drop by Does not apply route daily.    [provider]  cloNIDine  (CATAPRES ) 0.1 MG tablet Take 0.1 mg by mouth daily.    [provider]  hydrALAZINE  (APRESOLINE ) 25 MG tablet Take 1 tablet (25 mg total) by mouth in the morning and at bedtime. 10/03/21 10/03/22  Ozell Blunt, MD  metFORMIN (GLUCOPHAGE-XR) 500 MG 24 hr tablet  Take 500 mg by mouth daily. 10/08/19   [provider]  OVER THE COUNTER MEDICATION Take 1 tablet by mouth daily. Curemed    [provider]  RIVAROXABAN  (XARELTO ) VTE STARTER PACK (15 & 20 MG) Follow package directions: Take one 15mg  tablet by mouth twice a day. On day 22, switch to one 20mg  tablet once a day. Take with food. 08/15/22   Mason Sole, Pratik D, DO  thiamine (VITAMIN B-1) 100 MG tablet Take 100 mg by mouth daily.    [provider]  Turmeric (QC TUMERIC COMPLEX PO) Take 1 tablet by mouth daily.    [provider]  vitamin B-12 (CYANOCOBALAMIN) 1000 MCG tablet Take 1 tablet (1,000 mcg  total) by mouth daily. 10/03/21   Ozell Blunt, MD  zinc gluconate 50 MG tablet Take 1 tablet by mouth daily. 10/11/13   [provider]    Family History Family History  Problem Relation Age of Onset   Heart failure Mother    Pneumonia Father    Coronary artery disease Brother        bowel obstruction   Pneumonia Sister    Stroke Sister    Aneurysm Sister     Social History Social History   Tobacco Use   Smoking status: Former    Types: Cigarettes   Smokeless tobacco: Never  Vaping Use   Vaping status: Never Used  Substance Use Topics   Alcohol use: No   Drug use: No     Allergies   Lisinopril, Amlodipine , and Penicillins   Review of Systems Review of Systems Per HPI  Physical Exam Triage Vital Signs ED Triage Vitals  Encounter Vitals Group     BP 05/05/24 1223 (!) 153/81     Systolic BP Percentile --      Diastolic BP Percentile --      Pulse Rate 05/05/24 1223 83     Resp 05/05/24 1223 16     Temp 05/05/24 1223 98.8 F (37.1 C)     Temp src --      SpO2 05/05/24 1223 95 %     Weight --      Height --      Head Circumference --      Peak Flow --      Pain Score 05/05/24 1225 0     Pain Loc --      Pain Education --      Exclude from Growth Chart --    No data found.  Updated Vital Signs BP (!) 153/81 (BP Location: Right Arm)   Pulse 83   Temp 98.8 F (37.1 C)   Resp 16   SpO2 95%   Visual Acuity Right Eye Distance:   Left Eye Distance:   Bilateral Distance:    Right Eye Near:   Left Eye Near:    Bilateral Near:     Physical Exam Vitals and nursing note reviewed.  Constitutional:      Comments: Significantly drowsy throughout visit, having to continue to wake him to answer questions but answers appropriately and appears oriented to baseline.  Cooperative with exam  HENT:     Head: Atraumatic.     Mouth/Throat:     Mouth: Mucous membranes are moist.     Pharynx: Oropharynx is clear.  Eyes:     Extraocular Movements:  Extraocular movements intact.     Conjunctiva/sclera: Conjunctivae normal.  Cardiovascular:     Rate and Rhythm: Normal rate and regular rhythm.  Heart sounds: Normal heart sounds.  Pulmonary:     Effort: Pulmonary effort is normal.     Breath sounds: Normal breath sounds. No wheezing or rales.  Musculoskeletal:        General: Normal range of motion.     Cervical back: Normal range of motion and neck supple.  Skin:    General: Skin is warm and dry.  Neurological:     Comments: Drowsy but answering questions appropriately  Psychiatric:        Thought Content: Thought content normal.        Judgment: Judgment normal.      UC Treatments / Results  Labs (all labs ordered are listed, but only abnormal results are displayed) Labs Reviewed - No data to display  EKG   Radiology DG Chest 2 View Result Date: 05/05/2024 CLINICAL DATA:  Cough, shortness of breath. EXAM: CHEST - 2 VIEW COMPARISON:  August 13, 2022. FINDINGS: The heart size and mediastinal contours are within normal limits. Both lungs are clear. The visualized skeletal structures are unremarkable. IMPRESSION: No active cardiopulmonary disease. Electronically Signed   By: Rosalene Colon M.D.   On: 05/05/2024 12:49    Procedures Procedures (including critical care time)  Medications Ordered in UC Medications - No data to display  Initial Impression / Assessment and Plan / UC Course  I have reviewed the triage vital signs and the nursing notes.  Pertinent labs & imaging results that were available during my care of the patient were reviewed by me and considered in my medical decision making (see chart for details).     Patient significantly drowsy throughout time in department, falling asleep between questions but once roused answering questions appropriately.  Mildly hypertensive in triage otherwise vital signs are within normal limits, exam otherwise without red flag findings or obvious abnormalities, EKG today  showing normal sinus rhythm at 73 bpm and no acute ST or T wave changes, chest x-ray today negative for acute abnormality.  Will cover for potential developing lower respiratory infection with Zithromax, Mucinex, supportive over-the-counter medications and home care.  Discussed ED precautions if symptoms worsening in any time and PCP follow-up for recheck next week.  Patient and daughter agreeable to plan. Final Clinical Impressions(s) / UC Diagnoses   Final diagnoses:  SOB (shortness of breath)  Lower respiratory infection  Other fatigue  Weakness     Discharge Instructions      Overall your workup is reassuring today, however as we discussed I am very concerned with how fatigued you currently are.  Your EKG and chest x-ray did not show any concerning findings today but given your cough, congestion and worsening shortness of breath will cover with antibiotics for a possible lower respiratory infection developing in addition to Mucinex and over-the-counter remedies.  Go directly to the emergency department if your symptoms worsen or unresolving and follow-up with your primary care provider as soon as possible next week for a recheck.  ED Prescriptions     Medication Sig Dispense Auth. Provider   azithromycin (ZITHROMAX) 250 MG tablet Take first 2 tablets together, then 1 every day until finished. 6 tablet Corbin Dess, PA-C   guaiFENesin (MUCINEX) 600 MG 12 hr tablet Take 1 tablet (600 mg total) by mouth 2 (two) times daily as needed. 20 tablet Corbin Dess, [place]      PDMP not reviewed this encounter.   Corbin Dess, [place] 05/05/24 1355

## 2024-05-05 NOTE — Discharge Instructions (Signed)
 Overall your workup is reassuring today, however as we discussed I am very concerned with how fatigued you currently are.  Your EKG and chest x-ray did not show any concerning findings today but given your cough, congestion and worsening shortness of breath will cover with antibiotics for a possible lower respiratory infection developing in addition to Mucinex and over-the-counter remedies.  Go directly to the emergency department if your symptoms worsen or unresolving and follow-up with your primary care provider as soon as possible next week for a recheck.

## 2024-06-30 ENCOUNTER — Emergency Department (HOSPITAL_COMMUNITY)

## 2024-06-30 ENCOUNTER — Encounter (HOSPITAL_COMMUNITY): Payer: Self-pay | Admitting: Emergency Medicine

## 2024-06-30 ENCOUNTER — Other Ambulatory Visit: Payer: Self-pay

## 2024-06-30 ENCOUNTER — Inpatient Hospital Stay (HOSPITAL_COMMUNITY)
Admission: EM | Admit: 2024-06-30 | Discharge: 2024-07-05 | DRG: 374 | Disposition: A | Discharge status: Home / Self Care | Attending: Family Medicine | Admitting: Family Medicine

## 2024-06-30 DIAGNOSIS — K3189 Other diseases of stomach and duodenum: Secondary | ICD-10-CM

## 2024-06-30 DIAGNOSIS — E785 Hyperlipidemia, unspecified: Secondary | ICD-10-CM | POA: Diagnosis present

## 2024-06-30 DIAGNOSIS — Z515 Encounter for palliative care: Secondary | ICD-10-CM | POA: Diagnosis not present

## 2024-06-30 DIAGNOSIS — Z888 Allergy status to other drugs, medicaments and biological substances status: Secondary | ICD-10-CM

## 2024-06-30 DIAGNOSIS — Z8673 Personal history of transient ischemic attack (TIA), and cerebral infarction without residual deficits: Secondary | ICD-10-CM | POA: Diagnosis not present

## 2024-06-30 DIAGNOSIS — Z880 Allergy status to penicillin: Secondary | ICD-10-CM | POA: Diagnosis not present

## 2024-06-30 DIAGNOSIS — I639 Cerebral infarction, unspecified: Secondary | ICD-10-CM | POA: Diagnosis present

## 2024-06-30 DIAGNOSIS — T45515A Adverse effect of anticoagulants, initial encounter: Secondary | ICD-10-CM | POA: Diagnosis present

## 2024-06-30 DIAGNOSIS — D6832 Hemorrhagic disorder due to extrinsic circulating anticoagulants: Secondary | ICD-10-CM | POA: Diagnosis present

## 2024-06-30 DIAGNOSIS — K5730 Diverticulosis of large intestine without perforation or abscess without bleeding: Secondary | ICD-10-CM | POA: Diagnosis present

## 2024-06-30 DIAGNOSIS — I10 Essential (primary) hypertension: Secondary | ICD-10-CM | POA: Diagnosis present

## 2024-06-30 DIAGNOSIS — Z79899 Other long term (current) drug therapy: Secondary | ICD-10-CM

## 2024-06-30 DIAGNOSIS — Z860101 Personal history of adenomatous and serrated colon polyps: Secondary | ICD-10-CM | POA: Diagnosis not present

## 2024-06-30 DIAGNOSIS — Z86711 Personal history of pulmonary embolism: Secondary | ICD-10-CM | POA: Diagnosis not present

## 2024-06-30 DIAGNOSIS — C163 Malignant neoplasm of pyloric antrum: Principal | ICD-10-CM | POA: Diagnosis present

## 2024-06-30 DIAGNOSIS — F4310 Post-traumatic stress disorder, unspecified: Secondary | ICD-10-CM | POA: Diagnosis present

## 2024-06-30 DIAGNOSIS — Z87891 Personal history of nicotine dependence: Secondary | ICD-10-CM

## 2024-06-30 DIAGNOSIS — R630 Anorexia: Secondary | ICD-10-CM | POA: Diagnosis not present

## 2024-06-30 DIAGNOSIS — Z91013 Allergy to seafood: Secondary | ICD-10-CM | POA: Diagnosis not present

## 2024-06-30 DIAGNOSIS — K922 Gastrointestinal hemorrhage, unspecified: Secondary | ICD-10-CM | POA: Insufficient documentation

## 2024-06-30 DIAGNOSIS — K3182 Dieulafoy lesion (hemorrhagic) of stomach and duodenum: Secondary | ICD-10-CM | POA: Diagnosis present

## 2024-06-30 DIAGNOSIS — D62 Acute posthemorrhagic anemia: Secondary | ICD-10-CM | POA: Diagnosis present

## 2024-06-30 DIAGNOSIS — Z91018 Allergy to other foods: Secondary | ICD-10-CM

## 2024-06-30 DIAGNOSIS — Z7901 Long term (current) use of anticoagulants: Secondary | ICD-10-CM | POA: Diagnosis not present

## 2024-06-30 DIAGNOSIS — K921 Melena: Secondary | ICD-10-CM | POA: Diagnosis not present

## 2024-06-30 DIAGNOSIS — Z8249 Family history of ischemic heart disease and other diseases of the circulatory system: Secondary | ICD-10-CM

## 2024-06-30 DIAGNOSIS — D126 Benign neoplasm of colon, unspecified: Secondary | ICD-10-CM | POA: Diagnosis present

## 2024-06-30 DIAGNOSIS — E119 Type 2 diabetes mellitus without complications: Secondary | ICD-10-CM | POA: Diagnosis present

## 2024-06-30 DIAGNOSIS — Z7984 Long term (current) use of oral hypoglycemic drugs: Secondary | ICD-10-CM

## 2024-06-30 DIAGNOSIS — R634 Abnormal weight loss: Secondary | ICD-10-CM | POA: Diagnosis not present

## 2024-06-30 DIAGNOSIS — E1159 Type 2 diabetes mellitus with other circulatory complications: Secondary | ICD-10-CM | POA: Diagnosis not present

## 2024-06-30 DIAGNOSIS — Z91010 Allergy to peanuts: Secondary | ICD-10-CM

## 2024-06-30 DIAGNOSIS — D649 Anemia, unspecified: Principal | ICD-10-CM | POA: Diagnosis present

## 2024-06-30 DIAGNOSIS — Z823 Family history of stroke: Secondary | ICD-10-CM

## 2024-06-30 DIAGNOSIS — R195 Other fecal abnormalities: Secondary | ICD-10-CM | POA: Diagnosis not present

## 2024-06-30 LAB — IRON AND TIBC
Iron: 17 ug/dL — ABNORMAL LOW (ref 45–182)
Saturation Ratios: 5 % — ABNORMAL LOW (ref 17.9–39.5)
TIBC: 320 ug/dL (ref 250–450)
UIBC: 303 ug/dL

## 2024-06-30 LAB — TROPONIN I (HIGH SENSITIVITY): Troponin I (High Sensitivity): 5 ng/L (ref <18)

## 2024-06-30 LAB — RETICULOCYTES
Immature Retic Fract: 27.3 % — ABNORMAL HIGH (ref 2.3–15.9)
RBC.: 2.68 MIL/uL — ABNORMAL LOW (ref 4.22–5.81)
Retic Count, Absolute: 62.7 K/uL (ref 19.0–186.0)
Retic Ct Pct: 2.3 % (ref 0.4–3.1)

## 2024-06-30 LAB — CBC
HCT: 23.6 % — ABNORMAL LOW (ref 39.0–52.0)
Hemoglobin: 7.1 g/dL — ABNORMAL LOW (ref 13.0–17.0)
MCH: 26.5 pg (ref 26.0–34.0)
MCHC: 30.1 g/dL (ref 30.0–36.0)
MCV: 88.1 fL (ref 80.0–100.0)
Platelets: 262 K/uL (ref 150–400)
RBC: 2.68 MIL/uL — ABNORMAL LOW (ref 4.22–5.81)
RDW: 15.5 % (ref 11.5–15.5)
WBC: 5.3 K/uL (ref 4.0–10.5)
nRBC: 0 % (ref 0.0–0.2)

## 2024-06-30 LAB — ABO/RH: ABO/RH(D): A POS

## 2024-06-30 LAB — BRAIN NATRIURETIC PEPTIDE: B Natriuretic Peptide: 73 pg/mL (ref 0.0–100.0)

## 2024-06-30 LAB — PREPARE RBC (CROSSMATCH)

## 2024-06-30 LAB — COMPREHENSIVE METABOLIC PANEL WITH GFR
ALT: 11 U/L (ref 0–44)
AST: 20 U/L (ref 15–41)
Albumin: 2.9 g/dL — ABNORMAL LOW (ref 3.5–5.0)
Alkaline Phosphatase: 47 U/L (ref 38–126)
Anion gap: 10 (ref 5–15)
BUN: 16 mg/dL (ref 8–23)
CO2: 23 mmol/L (ref 22–32)
Calcium: 9.2 mg/dL (ref 8.9–10.3)
Chloride: 105 mmol/L (ref 98–111)
Creatinine, Ser: 0.72 mg/dL (ref 0.61–1.24)
GFR, Estimated: >60 mL/min (ref >60)
Glucose, Bld: 145 mg/dL — ABNORMAL HIGH (ref 70–99)
Potassium: 3.4 mmol/L — ABNORMAL LOW (ref 3.5–5.1)
Sodium: 138 mmol/L (ref 135–145)
Total Bilirubin: 0.5 mg/dL (ref 0.0–1.2)
Total Protein: 6.2 g/dL — ABNORMAL LOW (ref 6.5–8.1)

## 2024-06-30 LAB — VITAMIN B12: Vitamin B-12: 2294 pg/mL — ABNORMAL HIGH (ref 180–914)

## 2024-06-30 LAB — FOLATE: Folate: 7.7 ng/mL (ref >5.9)

## 2024-06-30 LAB — FERRITIN: Ferritin: 7 ng/mL — ABNORMAL LOW (ref 24–336)

## 2024-06-30 MED ORDER — ACETAMINOPHEN 650 MG RE SUPP
650.0000 mg | Freq: Four times a day (QID) | RECTAL | Status: DC | PRN
Start: 2024-06-30 — End: 2024-07-05

## 2024-06-30 MED ORDER — BISACODYL 5 MG PO TBEC
5.0000 mg | DELAYED_RELEASE_TABLET | Freq: Every day | ORAL | Status: DC | PRN
Start: 2024-06-30 — End: 2024-07-05

## 2024-06-30 MED ORDER — PANTOPRAZOLE SODIUM 40 MG IV SOLR
40.0000 mg | Freq: Once | INTRAVENOUS | Status: AC
  Administered 2024-06-30: 40 mg via INTRAVENOUS
  Filled 2024-06-30: qty 10

## 2024-06-30 MED ORDER — CLONIDINE HCL 0.1 MG PO TABS
0.1000 mg | ORAL_TABLET | Freq: Every day | ORAL | Status: DC
  Administered 2024-06-30 – 2024-07-05 (×6): 0.1 mg via ORAL
  Filled 2024-06-30 (×6): qty 1

## 2024-06-30 MED ORDER — SODIUM CHLORIDE 0.9% IV SOLUTION: Freq: Once | INTRAVENOUS | Status: AC

## 2024-06-30 MED ORDER — ACETAMINOPHEN 325 MG PO TABS
650.0000 mg | ORAL_TABLET | Freq: Four times a day (QID) | ORAL | Status: DC | PRN
  Administered 2024-07-01: 650 mg via ORAL
  Filled 2024-06-30: qty 2

## 2024-06-30 MED ORDER — ONDANSETRON HCL 4 MG/2ML IJ SOLN: 4.0000 mg | Freq: Four times a day (QID) | INTRAMUSCULAR | Status: DC | PRN

## 2024-06-30 MED ORDER — HYDRALAZINE HCL 25 MG PO TABS
25.0000 mg | ORAL_TABLET | Freq: Two times a day (BID) | ORAL | Status: DC
  Administered 2024-06-30 – 2024-07-05 (×10): 25 mg via ORAL
  Filled 2024-06-30 (×10): qty 1

## 2024-06-30 MED ORDER — INSULIN ASPART 100 UNIT/ML IJ SOLN
0.0000 [IU] | Freq: Four times a day (QID) | INTRAMUSCULAR | Status: DC
  Administered 2024-07-02 – 2024-07-03 (×2): 2 [IU] via SUBCUTANEOUS
  Administered 2024-07-05: 3 [IU] via SUBCUTANEOUS

## 2024-06-30 MED ORDER — ONDANSETRON HCL 4 MG PO TABS
4.0000 mg | ORAL_TABLET | Freq: Four times a day (QID) | ORAL | Status: DC | PRN
Start: 2024-06-30 — End: 2024-07-05

## 2024-06-30 MED ORDER — ATORVASTATIN CALCIUM 40 MG PO TABS
40.0000 mg | ORAL_TABLET | Freq: Every evening | ORAL | Status: DC
  Administered 2024-06-30 – 2024-07-04 (×5): 40 mg via ORAL
  Filled 2024-06-30 (×5): qty 1

## 2024-06-30 MED ORDER — PANTOPRAZOLE SODIUM 40 MG IV SOLR
40.0000 mg | Freq: Two times a day (BID) | INTRAVENOUS | Status: DC
  Administered 2024-06-30 – 2024-07-05 (×10): 40 mg via INTRAVENOUS
  Filled 2024-06-30 (×10): qty 10

## 2024-06-30 NOTE — ED Provider Notes (Signed)
 [facility] AT [facility] Provider Note   CSN: [id number] Arrival date & time: 06/30/24  1354     Patient presents with: Shortness of Breath and Fatigue   Dustin Chandler is a 89 y.o. male with a history including type 2 diabetes, history of CVA, history of PE who has been compliant with his Eliquis , also history of anemia on iron supplementation since last year, history of hypertension presenting for evaluation of an approximate 1 month history of slowly worsening shortness of breath in addition to generalized fatigue.  His symptoms have been waxing and waning for the past month but had been severe this week.  He states he gets short of breath and very tired just trying to dress himself in the morning.  He denies fevers or chills, he does have an occasional cough which has not worsened with the symptoms.  He does have significant lower extremity edema, however he denies orthopnea, states he actually feels better when he is supine.  He also endorses poor appetite, denies abdominal pain nausea, vomiting, has occasional constipation but has no complaints of that today.   The history is provided by the patient and a relative (Daughter at bedside).       Prior to Admission medications   Medication Sig Start Date End Date Taking? Authorizing Provider  apixaban  (ELIQUIS ) 5 MG TABS tablet Take 2 tablets (10 mg total) by mouth 2 (two) times daily for 7 days. 08/15/22 08/22/22  Maree, Pratik D, DO  apixaban  (ELIQUIS ) 5 MG TABS tablet Take 1 tablet (5 mg total) by mouth 2 (two) times daily. 08/22/22 11/20/22  Maree, Pratik D, DO  atorvastatin  (LIPITOR) 40 MG tablet Take 1 tablet (40 mg total) by mouth every evening. 10/03/21   Drusilla Sabas RAMAN, MD  azithromycin  (ZITHROMAX ) 250 MG tablet Take first 2 tablets together, then 1 every day until finished. 05/05/24   Stuart Vernell Norris, PA-C  cetirizine (ZYRTEC) 10 MG tablet Take 10 mg by mouth daily as needed. 04/01/15   [provider]  cholecalciferol (VITAMIN D) 1000 units tablet Take 1,000 Units by mouth daily.    [provider]  Cholecalciferol (VITAMIN D3) LIQD 1 drop by Does not apply route daily.    [provider]  cloNIDine  (CATAPRES ) 0.1 MG tablet Take 0.1 mg by mouth daily.    [provider]  guaiFENesin  (MUCINEX ) 600 MG 12 hr tablet Take 1 tablet (600 mg total) by mouth 2 (two) times daily as needed. 05/05/24   Stuart Vernell Norris, PA-C  hydrALAZINE  (APRESOLINE ) 25 MG tablet Take 1 tablet (25 mg total) by mouth in the morning and at bedtime. 10/03/21 10/03/22  Drusilla Sabas RAMAN, MD  metFORMIN (GLUCOPHAGE-XR) 500 MG 24 hr tablet Take 500 mg by mouth daily. 10/08/19   [provider]  OVER THE COUNTER MEDICATION Take 1 tablet by mouth daily. Curemed    [provider]  RIVAROXABAN  (XARELTO ) VTE STARTER PACK (15 & 20 MG) Follow package directions: Take one 15mg  tablet by mouth twice a day. On day 22, switch to one 20mg  tablet once a day. Take with food. 08/15/22   Maree, Pratik D, DO  thiamine  (VITAMIN B-1) 100 MG tablet Take 100 mg by mouth daily.    [provider]  Turmeric (QC TUMERIC COMPLEX PO) Take 1 tablet by mouth daily.    [provider]  vitamin B-12 (CYANOCOBALAMIN) 1000 MCG tablet Take 1 tablet (1,000 mcg total) by mouth daily. 10/03/21  Drusilla Sabas RAMAN, MD  zinc gluconate 50 MG tablet Take 1 tablet by mouth daily. 10/11/13   [provider]    Allergies: Lisinopril, Amlodipine , and Penicillins    Review of Systems  Constitutional:  Positive for appetite change and fatigue. Negative for fever.  HENT:  Negative for congestion.   Eyes: Negative.   Respiratory:  Positive for shortness of breath. Negative for chest tightness.   Cardiovascular:  Negative for chest pain.  Gastrointestinal:  Negative for abdominal pain, nausea and vomiting.  Genitourinary: Negative.   Musculoskeletal:  Negative for arthralgias, joint  swelling and neck pain.  Skin: Negative.  Negative for rash and wound.  Neurological:  Positive for weakness. Negative for dizziness, light-headedness, numbness and headaches.  Psychiatric/Behavioral: Negative.      Updated Vital Signs BP 114/69 (BP Location: Left Arm)   Pulse 88   Temp 98.5 F (36.9 C) (Oral)   Resp (!) 22   Ht 6' (1.829 m)   Wt 77.6 kg   SpO2 97%   BMI 23.19 kg/m   Physical Exam Vitals and nursing note reviewed.  Constitutional:      Appearance: He is well-developed.  HENT:     Head: Normocephalic and atraumatic.  Eyes:     Conjunctiva/sclera: Conjunctivae normal.     Comments: Conjunctival pallor.  Cardiovascular:     Rate and Rhythm: Normal rate and regular rhythm.     Heart sounds: Normal heart sounds.  Pulmonary:     Effort: Pulmonary effort is normal.     Breath sounds: Normal breath sounds. No wheezing, rhonchi or rales.  Chest:     Chest wall: No tenderness.  Abdominal:     General: Bowel sounds are normal.     Palpations: Abdomen is soft. There is no mass.     Tenderness: There is no abdominal tenderness. There is no guarding.  Genitourinary:    Rectum: Guaiac result positive.     Comments: Hemoccult positive resolved, not grossly bloody. Musculoskeletal:        General: Normal range of motion.     Cervical back: Normal range of motion.  Skin:    General: Skin is warm and dry.     Coloration: Skin is pale.  Neurological:     Mental Status: He is alert.     (all labs ordered are listed, but only abnormal results are displayed) Labs Reviewed  CBC - Abnormal; Notable for the following components:      Result Value   RBC 2.68 (*)    Hemoglobin 7.1 (*)    HCT 23.6 (*)    All other components within normal limits  COMPREHENSIVE METABOLIC PANEL WITH GFR - Abnormal; Notable for the following components:   Potassium 3.4 (*)    Glucose, Bld 145 (*)    Total Protein 6.2 (*)    Albumin 2.9 (*)    All other components within normal  limits  RETICULOCYTES - Abnormal; Notable for the following components:   RBC. 2.68 (*)    Immature Retic Fract 27.3 (*)    All other components within normal limits  BRAIN NATRIURETIC PEPTIDE  VITAMIN B12  FOLATE  IRON AND TIBC  FERRITIN  POC OCCULT BLOOD, ED  TYPE AND SCREEN  ABO/RH  PREPARE RBC (CROSSMATCH)  TROPONIN I (HIGH SENSITIVITY)    EKG: EKG Interpretation Date/Time:  Sunday June 30 2024 14:18:59 EDT Ventricular Rate:  86 PR Interval:  174 QRS Duration:  90 QT Interval:  371 QTC Calculation:  444 R Axis:   37  Text Interpretation: Sinus rhythm Borderline T abnormalities, inferior leads No STEMI No acute change when compared with prior EKG from 05/05/2024 Confirmed by Gennaro Bouchard ([id number]) on 06/30/2024 2:21:32 PM  Radiology: DG Chest 2 View Result Date: 06/30/2024 CLINICAL DATA:  SOB EXAM: CHEST - 2 VIEW COMPARISON:  May 05, 2024 FINDINGS: Limited assessment on lateral radiograph secondary to positioning. The cardiomediastinal silhouette is unchanged in contour. No pleural effusion. No pneumothorax. No acute pleuroparenchymal abnormality. Similar gaseous distension of bowel in the upper abdomen. Atherosclerotic calcifications. Multilevel degenerative changes of the thoracic spine. IMPRESSION: No acute cardiopulmonary abnormality. Electronically Signed   By: Corean Salter M.D.   On: 06/30/2024 15:23     Procedures   Medications Ordered in the ED  0.9 %  sodium chloride  infusion (Manually program via Guardrails IV Fluids) (has no administration in time range)  pantoprazole  (PROTONIX ) injection 40 mg (40 mg Intravenous Given 06/30/24 1610)                                    Medical Decision Making Patient presenting with generalized fatigue, weakness and shortness of breath with exertion, which sounds like this has been a slowly progressive worsening over at least the past month.  He is very pale on exam, biggest concern is for symptomatic anemia.  Additional  differential including electrolyte abnormalities, CHF, ACS, hydration.  He is profoundly anemic today with a hemoglobin of 7.1, he is occultly positive point-of-care guaiac testing.  He has been anemic since at least last November when his primary placed him on iron supplementation.  Denies ever seeing GI bleeding, has had a history of hemorrhoids in the past however.  States he had a negative colonoscopy about 8 years ago.  Denies upper symptoms, no GERD, no history of PUD.  Patient will need admission, 2 units PRBCs have been ordered.  Amount and/or Complexity of Data Reviewed Labs: ordered.    Details: Significant labs including a hemoglobin of 7.1, this is a normocytic anemia interesting his albumin is low at 2.9, potassium 3.4, he is Hemoccult positive. Radiology: ordered.    Details: Imaging reviewed, no acute cardiopulmonary abnormalities. ECG/medicine tests: ordered.    Details: Normal sinus rhythm rate 86 Discussion of management or test interpretation with external provider(s): Call placed to gastroenterology.  Spoke with Dr. Cinderella who has asked for iron studies, patient should be n.p.o. after midnight tonight, hold his Eliquis .  He will plan to scope him probably on Tuesday.  Of note, patient's last Eliquis  was taken today 7/13 around 12 noon.  Call placed to hospitalist for admission.  Discussed with Dr. Stinson who accepts pt for admission.  Risk Prescription drug management. Decision regarding hospitalization.   CRITICAL CARE Performed by: Mliss Narrow Total critical care time: 45 minutes Critical care time was exclusive of separately billable procedures and treating other patients. Critical care was necessary to treat or prevent imminent or life-threatening deterioration. Critical care was time spent personally by me on the following activities: development of treatment plan with patient and/or surrogate as well as nursing, discussions with consultants, evaluation of patient's  response to treatment, examination of patient, obtaining history from patient or surrogate, ordering and performing treatments and interventions, ordering and review of laboratory studies, ordering and review of radiographic studies, pulse oximetry and re-evaluation of patient's condition.      Final diagnoses:  Symptomatic anemia  ED Discharge Orders     None          Tavaria Mackins, PA-C 06/30/24 1630    Kammerer, Megan L, DO 07/03/24 [phone]

## 2024-06-30 NOTE — ED Notes (Signed)
 This RN has called the unit to inform them that the patient will be up shortly.

## 2024-06-30 NOTE — ED Triage Notes (Signed)
 Pt via POV c/o SOB and fatigue since earlier this morning. Ambulatory with cane. No pain. Hx DVT and PE on eliquis , prior CVA.

## 2024-06-30 NOTE — H&P (Signed)
 History and Physical    Patient: Dustin Chandler FMW:[id number] DOB: [birth year] DOA: 06/30/2024 DOS: the patient was seen and examined on 06/30/2024 PCP: System, Provider Not In  Patient coming from: Home  Chief Complaint:  Chief Complaint  Patient presents with   Shortness of Breath   Fatigue   HPI: Dustin Chandler is a 89 y.o. male with medical history significant of history of CVA, diabetes, hypertension, history of PE on Eliquis , history of CVA.  Patient seen due to progressive weakness over the last several months.  He got to the point today where he got short of breath just getting dressed and had to stop in the middle.  He denies fevers, chills, cough, chest pain.  He did see his PCP who placed him on iron due to the anemia, which she has been taking.  Here, he was found to be anemic with guaiac positive stool.  Review of Systems: As mentioned in the history of present illness. All other systems reviewed and are negative. Past Medical History:  Diagnosis Date   Colon polyps 2011   Non cancerous    CVA (cerebral vascular accident) (HCC) 10/01/2021   Diabetes mellitus    Diverticulosis    Elevated PSA 2010   dr. 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 His smoking use included cigarettes. He has never used smokeless tobacco. He reports that he does not drink alcohol and does not use drugs.  Allergies  Allergen Reactions   Lisinopril Swelling    Lip swelling   Amlodipine     Penicillins     Has patient had a PCN reaction causing immediate rash, facial/tongue/throat swelling, SOB or lightheadedness with hypotension: No Has patient had a PCN reaction causing severe rash involving mucus  membranes or skin necrosis: No Has patient had a PCN reaction that required hospitalization No Has patient had a PCN reaction occurring within the last 10 years: No  Locked my knee up If all of the above answers are NO, then may proceed with Cephalosporin use.     Family History  Problem Relation Age of Onset   Heart failure Mother    Pneumonia Father    Coronary artery disease Brother        bowel obstruction   Pneumonia Sister    Stroke Sister    Aneurysm Sister     Prior to Admission medications   Medication Sig Start Date End Date Taking? Authorizing Provider  apixaban  (ELIQUIS ) 5 MG TABS tablet Take 2 tablets (10 mg total) by mouth 2 (two) times daily for 7 days. 08/15/22 08/22/22  Maree, Pratik D, DO  apixaban  (ELIQUIS ) 5 MG TABS tablet Take 1 tablet (5 mg total) by mouth 2 (two) times daily. 08/22/22 11/20/22  Maree, Pratik D, DO  atorvastatin  (LIPITOR) 40 MG tablet Take 1 tablet (40 mg total) by mouth every evening. 10/03/21   Drusilla Sabas RAMAN, MD  cetirizine (ZYRTEC) 10 MG tablet Take 10 mg by mouth daily as needed. 04/01/15   [provider]  cholecalciferol (VITAMIN D) 1000 units tablet Take 1,000 Units by mouth daily.    [provider]  Cholecalciferol (VITAMIN D3) LIQD 1 drop by Does not apply route daily.    [provider]  cloNIDine  (CATAPRES ) 0.1 MG tablet Take 0.1 mg by mouth daily.    [provider]  guaiFENesin  (MUCINEX ) 600 MG 12 hr tablet Take 1 tablet (600 mg total) by mouth 2 (two) times daily as needed. 05/05/24   Stuart Vernell Norris, PA-C  hydrALAZINE  (APRESOLINE ) 25 MG tablet Take 1 tablet (25 mg total) by mouth in the morning and at bedtime. 10/03/21 10/03/22  Drusilla Sabas RAMAN, MD  metFORMIN (GLUCOPHAGE-XR) 500 MG 24 hr tablet Take 500 mg by mouth daily. 10/08/19   [provider]  OVER THE COUNTER MEDICATION Take 1 tablet by mouth daily. [employer]    [provider]  thiamine  (VITAMIN B-1) 100 MG tablet Take  100 mg by mouth daily.    [provider]  Turmeric (QC TUMERIC COMPLEX PO) Take 1 tablet by mouth daily.    [provider]  vitamin B-12 (CYANOCOBALAMIN) 1000 MCG tablet Take 1 tablet (1,000 mcg total) by mouth daily. 10/03/21   Drusilla Sabas RAMAN, MD  zinc gluconate 50 MG tablet Take 1 tablet by mouth daily. 10/11/13   [provider]    Physical Exam: Vitals:   06/30/24 1413 06/30/24 1416 06/30/24 1430  BP:  114/69   Pulse:  86 88  Resp:  (!) 26 (!) 22  Temp:  98.5 F (36.9 C)   TempSrc:  Oral   SpO2:  99% 97%  Weight: 77.6 kg    Height: 6' (1.829 m)     General: Elderly male. Awake and alert and oriented x3. No acute cardiopulmonary distress.  HEENT: Normocephalic atraumatic.  Right and left ears normal in appearance.  Pupils equal, round, reactive to light. Extraocular muscles are intact. Sclerae anicteric and noninjected.  Moist mucosal membranes. No mucosal lesions.  Neck: Neck supple without lymphadenopathy. No carotid bruits. No masses palpated.  Cardiovascular: Regular rate with normal S1-S2 sounds. No murmurs, rubs, gallops auscultated. No JVD.  Respiratory: Good respiratory effort with no wheezes, rales, rhonchi. Lungs clear to auscultation bilaterally.  No accessory muscle use. Abdomen: Soft, nontender, nondistended. Active bowel sounds. No masses or hepatosplenomegaly  Skin: No rashes, lesions, or ulcerations.  Dry, warm to touch. 2+ dorsalis pedis and radial pulses. Musculoskeletal: No calf or leg pain. All major joints not erythematous nontender.  No upper or lower joint deformation.  Good ROM.  No contractures  Psychiatric: Intact judgment and insight. Pleasant and cooperative. Neurologic: No focal neurological deficits. Strength is 5/5 and symmetric in upper and lower extremities.  Cranial nerves II through XII are grossly intact.  Data Reviewed: Labs and imaging reviewed  Assessment and Plan: No notes have been filed under this hospital  service. Service: Hospitalist  Principal Problem:   Symptomatic anemia Active Problems:   Essential hypertension   DM (diabetes mellitus) (HCC)   Dyslipidemia   Stroke (cerebrum) (HCC)   History of pulmonary embolus (PE)   GI bleed  Symptomatic anemia Transfused 2 units Check CBC in the morning  hold Eliquis  GI bleed GI consulted Protonix  N.p.o. after midnight History of PE on Eliquis  Hold Eliquis  History of stroke Diabetes Sliding scale insulin  Hypertension Continue blood pressure control   Advance Care Planning:   Code Status: Full Code confirmed by patient  Consults: GI  Family Communication: Family member present during interview and exam  Severity of Illness: The appropriate patient status for this patient is INPATIENT. Inpatient status is judged  to be reasonable and necessary in order to provide the required intensity of service to ensure the patient's safety. The patient's presenting symptoms, physical exam findings, and initial radiographic and laboratory data in the context of their chronic comorbidities is felt to place them at high risk for further clinical deterioration. Furthermore, it is not anticipated that the patient will be medically stable for discharge from the hospital within 2 midnights of admission.   * I certify that at the point of admission it is my clinical judgment that the patient will require inpatient hospital care spanning beyond 2 midnights from the point of admission due to high intensity of service, high risk for further deterioration and high frequency of surveillance required.*  Author: Deshan Hemmelgarn J Engelbert Sevin, DO 06/30/2024 4:44 PM  For on call review [email or web address].

## 2024-07-01 DIAGNOSIS — R630 Anorexia: Secondary | ICD-10-CM | POA: Diagnosis not present

## 2024-07-01 DIAGNOSIS — R634 Abnormal weight loss: Secondary | ICD-10-CM | POA: Diagnosis not present

## 2024-07-01 DIAGNOSIS — R195 Other fecal abnormalities: Secondary | ICD-10-CM | POA: Diagnosis not present

## 2024-07-01 DIAGNOSIS — D649 Anemia, unspecified: Secondary | ICD-10-CM | POA: Diagnosis not present

## 2024-07-01 DIAGNOSIS — Z860101 Personal history of adenomatous and serrated colon polyps: Secondary | ICD-10-CM

## 2024-07-01 LAB — BASIC METABOLIC PANEL WITH GFR
Anion gap: 8 (ref 5–15)
BUN: 15 mg/dL (ref 8–23)
CO2: 23 mmol/L (ref 22–32)
Calcium: 8.7 mg/dL — ABNORMAL LOW (ref 8.9–10.3)
Chloride: 107 mmol/L (ref 98–111)
Creatinine, Ser: 0.68 mg/dL (ref 0.61–1.24)
GFR, Estimated: >60 mL/min (ref >60)
Glucose, Bld: 91 mg/dL (ref 70–99)
Potassium: 3.5 mmol/L (ref 3.5–5.1)
Sodium: 138 mmol/L (ref 135–145)

## 2024-07-01 LAB — CBC
HCT: 24.5 % — ABNORMAL LOW (ref 39.0–52.0)
Hemoglobin: 7.9 g/dL — ABNORMAL LOW (ref 13.0–17.0)
MCH: 26.9 pg (ref 26.0–34.0)
MCHC: 32.2 g/dL (ref 30.0–36.0)
MCV: 83.3 fL (ref 80.0–100.0)
Platelets: 228 K/uL (ref 150–400)
RBC: 2.94 MIL/uL — ABNORMAL LOW (ref 4.22–5.81)
RDW: 17.2 % — ABNORMAL HIGH (ref 11.5–15.5)
WBC: 5.4 K/uL (ref 4.0–10.5)
nRBC: 0 % (ref 0.0–0.2)

## 2024-07-01 LAB — TYPE AND SCREEN
ABO/RH(D): A POS
Antibody Screen: NEGATIVE
Unit division: 0
Unit division: 0

## 2024-07-01 LAB — BPAM RBC
Blood Product Expiration Date: 202508092359
Blood Product Expiration Date: 202508092359
ISSUE DATE / TIME: 202507131835
ISSUE DATE / TIME: 202507132305
Unit Type and Rh: 6200
Unit Type and Rh: 6200

## 2024-07-01 LAB — GLUCOSE, CAPILLARY
Glucose-Capillary: 112 mg/dL — ABNORMAL HIGH (ref 70–99)
Glucose-Capillary: 129 mg/dL — ABNORMAL HIGH (ref 70–99)
Glucose-Capillary: 91 mg/dL (ref 70–99)
Glucose-Capillary: 92 mg/dL (ref 70–99)
Glucose-Capillary: 97 mg/dL (ref 70–99)

## 2024-07-01 LAB — HEMOGLOBIN AND HEMATOCRIT, BLOOD
HCT: 28.6 % — ABNORMAL LOW (ref 39.0–52.0)
Hemoglobin: 9 g/dL — ABNORMAL LOW (ref 13.0–17.0)

## 2024-07-01 MED ORDER — PEG 3350-KCL-NA BICARB-NACL 420 G PO SOLR
4000.0000 mL | Freq: Once | ORAL | Status: AC
  Administered 2024-07-01: 4000 mL via ORAL

## 2024-07-01 MED ORDER — SODIUM CHLORIDE 0.9 % IV SOLN: INTRAVENOUS | Status: AC

## 2024-07-01 MED ORDER — THIAMINE MONONITRATE 100 MG PO TABS
100.0000 mg | ORAL_TABLET | Freq: Every day | ORAL | Status: DC
  Administered 2024-07-01 – 2024-07-05 (×5): 100 mg via ORAL
  Filled 2024-07-01 (×5): qty 1

## 2024-07-01 MED ORDER — PEG 3350-KCL-NA BICARB-NACL 420 G PO SOLR: 4000.0000 mL | Freq: Once | ORAL | Status: DC

## 2024-07-01 NOTE — Progress Notes (Signed)
   07/01/24 1036  TOC Brief Assessment  Insurance and Status Reviewed  Patient has primary care physician Yes  Home environment has been reviewed Home w/ spouse  Prior level of function: Modified independent with [employer]  Prior/Current Home Services No current home services  Social Drivers of Health Review SDOH reviewed no interventions necessary  Readmission risk has been reviewed Yes  Transition of care needs no transition of care needs at this time

## 2024-07-01 NOTE — H&P (View-Only) (Signed)
 @LOGO @   Referring Provider: Birdena Clarity,  PA-C Primary Care Physician:  System, Provider Not In Primary Gastroenterologist:  Dr. Cindie (Dr. Harvey previously)  Date of Admission: 06/30/24 Date of Consultation: 07/01/24  Reason for Consultation:  Symptomatic anemia with heme positive stool  HPI:  Dustin Chandler is a 89 y.o. year old male with history of CVA, DVT, PE on Eliquis , diabetes, HTN, PTSD, adenomatous colon polyps, who presented to the emergency room with worsening shortness of breath and generalized fatigue over the last month with worsening symptoms over the last week.  ED course: Initial BPs soft, but remained hemodynamically stable.  Labs remarkable for hemoglobin 7.1, potassium 3.4, albumin 2.9, iron 17, saturation 5%, ferritin 7. Stool heme positive, but not grossly bloody per ED note.  Chest x-ray negative.   Today:  Reports he came to the hospital due to worsening shortness of breath with minimal exertion, generalized weakness.  Symptoms have been going on for the last month, but worsening recently.  Reports being diagnosed with iron deficiency anemia last year and has been on oral iron since October 2024.  Also reports black stool for at least several months, but unable to tell me if this started right after starting oral iron or a few months later.  Denies BRBPR.  Denies abdominal pain, nausea, vomiting, heartburn, dysphagia, constipation, diarrhea.  Reports decreased p.o. intake, lack of appetite with associated weight loss for the last year.  Reports his decreased p.o. intake is primarily driven by his fatigue.  States he gets tired when chewing and once he is tired, he just quits.  States he is feeling some better since receiving blood transfusion yesterday.  Last Eliquis : Yesterday morning  NSAIDs: Denies    He was admitted to the hospitalist service, transfused 2 units PRBCs, Eliquis  placed on hold, started on IV Protonix  40 mg twice daily, and GI  consulted for further evaluation of anemia with heme positive stool.   Last colonoscopy on file August 2011 with one 4 mm polyp removed via cold forceps and a 6 mm flat rectal polyp that was biopsied.  Rare descending colon diverticula, melanosis coli.  Pathology showed adenomatous colon polyps.  Past Medical History:  Diagnosis Date   Colon polyps 2011   Non cancerous    CVA (cerebral vascular accident) (HCC) 10/01/2021   Diabetes mellitus    Diverticulosis    Elevated PSA 2010   dr. 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 Authorizing Provider  apixaban  (ELIQUIS ) 5 MG TABS tablet Take 1 tablet (5 mg total) by mouth 2 (two) times daily. Patient taking differently: Take 2.5 mg by mouth 2 (two) times daily. 08/22/22 06/30/24 Yes Shah, Pratik D, DO  atorvastatin  (LIPITOR) 40 MG tablet Take 1 tablet (40 mg total) by mouth every evening. 10/03/21  Yes Drusilla Sabas RAMAN, MD  cetirizine (ZYRTEC) 10 MG tablet Take 10 mg by mouth daily as needed for allergies. 04/01/15  Yes [provider]  Cholecalciferol (VITAMIN D3) LIQD Take 1 drop by mouth daily.   Yes [provider]  cloNIDine  (CATAPRES ) 0.1 MG tablet Take  0.1 mg by mouth daily.   Yes [provider]  diphenhydrAMINE  (BENADRYL ) 25 MG tablet Take 25 mg by mouth at bedtime.   Yes [provider]  guaiFENesin  (MUCINEX ) 600 MG 12 hr tablet Take 1 tablet (600 mg total) by mouth 2 (two) times daily as needed. Patient taking differently: Take 600 mg by mouth 2 (two) times daily as needed for cough or to loosen phlegm. 05/05/24  Yes Stuart Vernell Norris, PA-C  hydrALAZINE  (APRESOLINE ) 25 MG tablet Take 1 tablet  (25 mg total) by mouth in the morning and at bedtime. 10/03/21 06/30/24 Yes Drusilla Sabas RAMAN, MD  metFORMIN (GLUCOPHAGE-XR) 500 MG 24 hr tablet Take 500 mg by mouth daily. 10/08/19  Yes [provider]  thiamine  (VITAMIN B-1) 100 MG tablet Take 100 mg by mouth daily.   Yes [provider]  vitamin B-12 (CYANOCOBALAMIN) 1000 MCG tablet Take 1 tablet (1,000 mcg total) by mouth daily. 10/03/21  Yes Drusilla Sabas RAMAN, MD    Current Facility-Administered Medications  Medication Dose Route Frequency Provider Last Rate Last Admin   acetaminophen  (TYLENOL ) tablet 650 mg  650 mg Oral Q6H PRN Stinson, Jacob J, DO       Or   acetaminophen  (TYLENOL ) suppository 650 mg  650 mg Rectal Q6H PRN Stinson, Jacob J, DO       atorvastatin  (LIPITOR) tablet 40 mg  40 mg Oral QPM Stinson, Jacob J, DO   40 mg at 06/30/24 1805   bisacodyl  (DULCOLAX) EC tablet 5 mg  5 mg Oral Daily PRN Stinson, Jacob J, DO       cloNIDine  (CATAPRES ) tablet 0.1 mg  0.1 mg Oral Daily Stinson, Jacob J, DO   0.1 mg at 07/01/24 1008   hydrALAZINE  (APRESOLINE ) tablet 25 mg  25 mg Oral BID Stinson, Jacob J, DO   25 mg at 07/01/24 1008   insulin  aspart (novoLOG ) injection 0-15 Units  0-15 Units Subcutaneous Q6H Stinson, Jacob J, DO       ondansetron  (ZOFRAN ) tablet 4 mg  4 mg Oral Q6H PRN Stinson, Jacob J, DO       Or   ondansetron  (ZOFRAN ) injection 4 mg  4 mg Intravenous Q6H PRN Stinson, Jacob J, DO       pantoprazole  (PROTONIX ) injection 40 mg  40 mg Intravenous Q12H Stinson, Jacob J, DO   40 mg at 07/01/24 1008    Allergies as of 06/30/2024 - Review Complete 06/30/2024  Allergen Reaction Noted   Lisinopril Anaphylaxis and Swelling 12/07/2019   Amlodipine  Other (See Comments) 01/12/2016   Fish allergy Itching 06/30/2024   Peanut-containing drug products Itching 06/30/2024   Penicillin g Other (See Comments) 02/10/2016   Penicillins Other (See Comments) 07/18/2008   Tomato Itching 06/30/2024    Family History  Problem  Relation Age of Onset   Heart failure Mother    Pneumonia Father    Coronary artery disease Brother        bowel obstruction   Pneumonia Sister    Stroke Sister    Aneurysm Sister     Social History   Socioeconomic History   Marital status: Married    Spouse name: Not on file   Number of children: 4   Years of education: Not on file   Highest education level: Not on file  Occupational History   Occupation: Employed   Tobacco Use   Smoking status: Former    Types: Cigarettes   Smokeless tobacco: Never  Vaping Use  Vaping status: Never Used  Substance and Sexual Activity   Alcohol use: No   Drug use: No   Sexual activity: Not Currently    Birth control/protection: None  Other Topics Concern   Not on file  Social History Narrative   Not on file   Social Drivers of Health   Financial Resource Strain: Not on file  Food Insecurity: No Food Insecurity (06/30/2024)   Hunger Vital Sign    Worried About Running Out of Food in the Last Year: Never true    Ran Out of Food in the Last Year: Never true  Transportation Needs: No Transportation Needs (06/30/2024)   PRAPARE - Administrator, Civil Service (Medical): No    Lack of Transportation (Non-Medical): No  Physical Activity: Not on file  Stress: Not on file  Social Connections: Socially Integrated (06/30/2024)   Social Connection and Isolation Panel    Frequency of Communication with Friends and Family: More than three times a week    Frequency of Social Gatherings with Friends and Family: More than three times a week    Attends Religious Services: More than 4 times per year    Active Member of [employer] or Organizations: Yes    Attends Engineer, structural: More than 4 times per year    Marital Status: Married  Catering manager Violence: Not At Risk (06/30/2024)   Humiliation, Afraid, Rape, and Kick questionnaire    Fear of Current or Ex-Partner: No    Emotionally Abused: No    Physically Abused: No     Sexually Abused: No    Review of Systems: Gen: Denies fever, chills, cold or flu like symptoms, pre-syncope, or syncope.  CV: Denies chest pain, heart palpitations.   Resp: Admits to SOB with minimal exertion. No cough.  GI: See HPI GU : Denies urinary burning, urinary frequency, urinary incontinence.  MS: Denies joint pain. Derm: Denies rash. Psych: Denies depression, anxiety. Heme: See HPI  Physical Exam: Vital signs in last 24 hours: Temp:  [98.3 F (36.8 C)-99.5 F (37.5 C)] 99.5 F (37.5 C) (07/14 0435) Pulse Rate:  [59-88] 72 (07/14 0435) Resp:  [16-26] 16 (07/14 0435) BP: (102-153)/(55-84) 146/80 (07/14 0435) SpO2:  [97 %-100 %] 98 % (07/14 0435) Weight:  [72.4 kg-77.6 kg] 72.4 kg (07/13 1720) Last BM Date : 06/29/24 General:   Alert,  thin, frail appearing, pleasant and cooperative in NAD Head:  Normocephalic and atraumatic. Eyes:  Sclera clear, no icterus.   Conjunctiva pink. Ears:  Normal auditory acuity. Lungs:  Clear throughout to auscultation.   No wheezes, crackles, or rhonchi. No acute distress. Heart:  Regular rate and rhythm; no murmurs, clicks, rubs,  or gallops. Abdomen:  Soft, nontender and nondistended. No masses, hepatosplenomegaly or hernias noted. Normal bowel sounds, without guarding, and without rebound.   Rectal:  Deferred until time of colonoscopy.   Msk:  Symmetrical without gross deformities. Normal posture. Extremities:  With 1+ bilateral LE edema.  Neurologic:  Alert and  oriented x4;  grossly normal neurologically. Skin:  Intact without significant lesions or rashes.. Psych:  Normal mood and affect.  Intake/Output from previous day: 07/13 0701 - 07/14 0700 In: 952 [Blood:952] Out: -  Intake/Output this shift: No intake/output data recorded.  Lab Results: Recent Labs    06/30/24 1445 07/01/24 0442  WBC 5.3 5.4  HGB 7.1* 7.9*  HCT 23.6* 24.5*  PLT 262 228   BMET Recent Labs    06/30/24 1449 07/01/24 0442  NA 138 138  K  3.4* 3.5  CL 105 107  CO2 23 23  GLUCOSE 145* 91  BUN 16 15  CREATININE 0.72 0.68  CALCIUM  9.2 8.7*   LFT Recent Labs    06/30/24 1449  PROT 6.2*  ALBUMIN 2.9*  AST 20  ALT 11  ALKPHOS 47  BILITOT 0.5    Studies/Results: DG Chest 2 View Result Date: 06/30/2024 CLINICAL DATA:  SOB EXAM: CHEST - 2 VIEW COMPARISON:  May 05, 2024 FINDINGS: Limited assessment on lateral radiograph secondary to positioning. The cardiomediastinal silhouette is unchanged in contour. No pleural effusion. No pneumothorax. No acute pleuroparenchymal abnormality. Similar gaseous distension of bowel in the upper abdomen. Atherosclerotic calcifications. Multilevel degenerative changes of the thoracic spine. IMPRESSION: No acute cardiopulmonary abnormality. Electronically Signed   By: Corean Salter M.D.   On: 06/30/2024 15:23    Impression: 89 y.o. year old male with history of CVA, DVT, PE in 2023 on Eliquis , diabetes, HTN, PTSD, adenomatous colon polyps, who presented to the emergency room with worsening shortness of breath and generalized fatigue over the last month with worsening symptoms over the last week, found to have hemoglobin of 7.1 with iron deficiency, heme positive stool.  GI consulted for further evaluation of symptomatic anemia.  Symptomatic anemia/heme positive stool/black stool: Admitted with hemoglobin of 7.1, iron saturation 5%, iron 17, ferritin 7.  Stool heme positive, but not grossly bloody per ED note.  Patient reports black stool for at least the last several months though he has been taking oral iron daily since October 2024 due to IDA, per his report.  Also reports poor p.o. intake with unintentional weight loss over the last year, but no other significant GI symptoms.  Denies NSAIDs. No prior EGD.  Last colonoscopy in 2011 with adenomatous colon polyps, 1 of which was a flat polyp in the rectum and only biopsied.  It is unclear if the patient's black stool is truly melena versus  secondary to oral iron, but regardless, he has symptomatic iron deficiency anemia and ideally should have EGD and colonoscopy for further evaluation.  As patient's last dose of Eliquis  was yesterday morning, we should be able to proceed with procedures tomorrow.  Will confirm with Dr. Cindie.  He has received 2 units PRBCs and Hgb improved to 7.9 today which is not as would be expected. Will need to continue to monitor closely and transfuse additional units PRBCs as needed. Encouragingly patient reports last black stool was yesterday.   Lack of appetite/unintentional weight loss:  Patient reports symptoms for the last 1 year. No recent weight to compare to. Last weight on file from 2023 where he was 6 lbs heaver than he was on admission. Notably, patient reports due to fatigue while chewing, he just stops eating which may be influenced by symptomatic anemia. We will rule out gastric cancer, PUD, H pylori, etc at the time of EGD this admission.   Plan: Needs EGD and colonoscopy to evaluate IDA/black stool. This will likely be tomorrow. Will schedule pending discussion with Dr. Cindie.  Continue to hold Eliquis .  Clear liquid diet today.  NPO at midnight.  IV Protonix  40 mg BID.  Hold iron while inpatient.  Continue to monitor H/H and for overt GI bleeding. Transfuse as necessary.    LOS: 1 day    07/01/2024, 10:46 AM   Josette Centers, PA-C [facility]

## 2024-07-01 NOTE — Consult Note (Signed)
 @LOGO @   Referring Provider: Birdena Clarity,  PA-C Primary Care Physician:  System, Provider Not In Primary Gastroenterologist:  Dr. Cindie (Dr. Harvey previously)  Date of Admission: 06/30/24 Date of Consultation: 07/01/24  Reason for Consultation:  Symptomatic anemia with heme positive stool  HPI:  Dustin Chandler is a 89 y.o. year old male with history of CVA, DVT, PE on Eliquis , diabetes, HTN, PTSD, adenomatous colon polyps, who presented to the emergency room with worsening shortness of breath and generalized fatigue over the last month with worsening symptoms over the last week.  ED course: Initial BPs soft, but remained hemodynamically stable.  Labs remarkable for hemoglobin 7.1, potassium 3.4, albumin 2.9, iron 17, saturation 5%, ferritin 7. Stool heme positive, but not grossly bloody per ED note.  Chest x-ray negative.   Today:  Reports he came to the hospital due to worsening shortness of breath with minimal exertion, generalized weakness.  Symptoms have been going on for the last month, but worsening recently.  Reports being diagnosed with iron deficiency anemia last year and has been on oral iron since October 2024.  Also reports black stool for at least several months, but unable to tell me if this started right after starting oral iron or a few months later.  Denies BRBPR.  Denies abdominal pain, nausea, vomiting, heartburn, dysphagia, constipation, diarrhea.  Reports decreased p.o. intake, lack of appetite with associated weight loss for the last year.  Reports his decreased p.o. intake is primarily driven by his fatigue.  States he gets tired when chewing and once he is tired, he just quits.  States he is feeling some better since receiving blood transfusion yesterday.  Last Eliquis : Yesterday morning  NSAIDs: Denies    He was admitted to the hospitalist service, transfused 2 units PRBCs, Eliquis  placed on hold, started on IV Protonix  40 mg twice daily, and GI  consulted for further evaluation of anemia with heme positive stool.   Last colonoscopy on file August 2011 with one 4 mm polyp removed via cold forceps and a 6 mm flat rectal polyp that was biopsied.  Rare descending colon diverticula, melanosis coli.  Pathology showed adenomatous colon polyps.  Past Medical History:  Diagnosis Date   Colon polyps 2011   Non cancerous    CVA (cerebral vascular accident) (HCC) 10/01/2021   Diabetes mellitus    Diverticulosis    Elevated PSA 2010   dr. 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 Authorizing Provider  apixaban  (ELIQUIS ) 5 MG TABS tablet Take 1 tablet (5 mg total) by mouth 2 (two) times daily. Patient taking differently: Take 2.5 mg by mouth 2 (two) times daily. 08/22/22 06/30/24 Yes Shah, Pratik D, DO  atorvastatin  (LIPITOR) 40 MG tablet Take 1 tablet (40 mg total) by mouth every evening. 10/03/21  Yes Drusilla Sabas RAMAN, MD  cetirizine (ZYRTEC) 10 MG tablet Take 10 mg by mouth daily as needed for allergies. 04/01/15  Yes [provider]  Cholecalciferol (VITAMIN D3) LIQD Take 1 drop by mouth daily.   Yes [provider]  cloNIDine  (CATAPRES ) 0.1 MG tablet Take  0.1 mg by mouth daily.   Yes [provider]  diphenhydrAMINE  (BENADRYL ) 25 MG tablet Take 25 mg by mouth at bedtime.   Yes [provider]  guaiFENesin  (MUCINEX ) 600 MG 12 hr tablet Take 1 tablet (600 mg total) by mouth 2 (two) times daily as needed. Patient taking differently: Take 600 mg by mouth 2 (two) times daily as needed for cough or to loosen phlegm. 05/05/24  Yes Stuart Vernell Norris, PA-C  hydrALAZINE  (APRESOLINE ) 25 MG tablet Take 1 tablet  (25 mg total) by mouth in the morning and at bedtime. 10/03/21 06/30/24 Yes Drusilla Sabas RAMAN, MD  metFORMIN (GLUCOPHAGE-XR) 500 MG 24 hr tablet Take 500 mg by mouth daily. 10/08/19  Yes [provider]  thiamine  (VITAMIN B-1) 100 MG tablet Take 100 mg by mouth daily.   Yes [provider]  vitamin B-12 (CYANOCOBALAMIN) 1000 MCG tablet Take 1 tablet (1,000 mcg total) by mouth daily. 10/03/21  Yes Drusilla Sabas RAMAN, MD    Current Facility-Administered Medications  Medication Dose Route Frequency Provider Last Rate Last Admin   acetaminophen  (TYLENOL ) tablet 650 mg  650 mg Oral Q6H PRN Stinson, Jacob J, DO       Or   acetaminophen  (TYLENOL ) suppository 650 mg  650 mg Rectal Q6H PRN Stinson, Jacob J, DO       atorvastatin  (LIPITOR) tablet 40 mg  40 mg Oral QPM Stinson, Jacob J, DO   40 mg at 06/30/24 1805   bisacodyl  (DULCOLAX) EC tablet 5 mg  5 mg Oral Daily PRN Stinson, Jacob J, DO       cloNIDine  (CATAPRES ) tablet 0.1 mg  0.1 mg Oral Daily Stinson, Jacob J, DO   0.1 mg at 07/01/24 1008   hydrALAZINE  (APRESOLINE ) tablet 25 mg  25 mg Oral BID Stinson, Jacob J, DO   25 mg at 07/01/24 1008   insulin  aspart (novoLOG ) injection 0-15 Units  0-15 Units Subcutaneous Q6H Stinson, Jacob J, DO       ondansetron  (ZOFRAN ) tablet 4 mg  4 mg Oral Q6H PRN Stinson, Jacob J, DO       Or   ondansetron  (ZOFRAN ) injection 4 mg  4 mg Intravenous Q6H PRN Stinson, Jacob J, DO       pantoprazole  (PROTONIX ) injection 40 mg  40 mg Intravenous Q12H Stinson, Jacob J, DO   40 mg at 07/01/24 1008    Allergies as of 06/30/2024 - Review Complete 06/30/2024  Allergen Reaction Noted   Lisinopril Anaphylaxis and Swelling 12/07/2019   Amlodipine  Other (See Comments) 01/12/2016   Fish allergy Itching 06/30/2024   Peanut-containing drug products Itching 06/30/2024   Penicillin g Other (See Comments) 02/10/2016   Penicillins Other (See Comments) 07/18/2008   Tomato Itching 06/30/2024    Family History  Problem  Relation Age of Onset   Heart failure Mother    Pneumonia Father    Coronary artery disease Brother        bowel obstruction   Pneumonia Sister    Stroke Sister    Aneurysm Sister     Social History   Socioeconomic History   Marital status: Married    Spouse name: Not on file   Number of children: 4   Years of education: Not on file   Highest education level: Not on file  Occupational History   Occupation: Employed   Tobacco Use   Smoking status: Former    Types: Cigarettes   Smokeless tobacco: Never  Vaping Use  Vaping status: Never Used  Substance and Sexual Activity   Alcohol use: No   Drug use: No   Sexual activity: Not Currently    Birth control/protection: None  Other Topics Concern   Not on file  Social History Narrative   Not on file   Social Drivers of Health   Financial Resource Strain: Not on file  Food Insecurity: No Food Insecurity (06/30/2024)   Hunger Vital Sign    Worried About Running Out of Food in the Last Year: Never true    Ran Out of Food in the Last Year: Never true  Transportation Needs: No Transportation Needs (06/30/2024)   PRAPARE - Administrator, Civil Service (Medical): No    Lack of Transportation (Non-Medical): No  Physical Activity: Not on file  Stress: Not on file  Social Connections: Socially Integrated (06/30/2024)   Social Connection and Isolation Panel    Frequency of Communication with Friends and Family: More than three times a week    Frequency of Social Gatherings with Friends and Family: More than three times a week    Attends Religious Services: More than 4 times per year    Active Member of [employer] or Organizations: Yes    Attends Engineer, structural: More than 4 times per year    Marital Status: Married  Catering manager Violence: Not At Risk (06/30/2024)   Humiliation, Afraid, Rape, and Kick questionnaire    Fear of Current or Ex-Partner: No    Emotionally Abused: No    Physically Abused: No     Sexually Abused: No    Review of Systems: Gen: Denies fever, chills, cold or flu like symptoms, pre-syncope, or syncope.  CV: Denies chest pain, heart palpitations.   Resp: Admits to SOB with minimal exertion. No cough.  GI: See HPI GU : Denies urinary burning, urinary frequency, urinary incontinence.  MS: Denies joint pain. Derm: Denies rash. Psych: Denies depression, anxiety. Heme: See HPI  Physical Exam: Vital signs in last 24 hours: Temp:  [98.3 F (36.8 C)-99.5 F (37.5 C)] 99.5 F (37.5 C) (07/14 0435) Pulse Rate:  [59-88] 72 (07/14 0435) Resp:  [16-26] 16 (07/14 0435) BP: (102-153)/(55-84) 146/80 (07/14 0435) SpO2:  [97 %-100 %] 98 % (07/14 0435) Weight:  [72.4 kg-77.6 kg] 72.4 kg (07/13 1720) Last BM Date : 06/29/24 General:   Alert,  thin, frail appearing, pleasant and cooperative in NAD Head:  Normocephalic and atraumatic. Eyes:  Sclera clear, no icterus.   Conjunctiva pink. Ears:  Normal auditory acuity. Lungs:  Clear throughout to auscultation.   No wheezes, crackles, or rhonchi. No acute distress. Heart:  Regular rate and rhythm; no murmurs, clicks, rubs,  or gallops. Abdomen:  Soft, nontender and nondistended. No masses, hepatosplenomegaly or hernias noted. Normal bowel sounds, without guarding, and without rebound.   Rectal:  Deferred until time of colonoscopy.   Msk:  Symmetrical without gross deformities. Normal posture. Extremities:  With 1+ bilateral LE edema.  Neurologic:  Alert and  oriented x4;  grossly normal neurologically. Skin:  Intact without significant lesions or rashes.. Psych:  Normal mood and affect.  Intake/Output from previous day: 07/13 0701 - 07/14 0700 In: 952 [Blood:952] Out: -  Intake/Output this shift: No intake/output data recorded.  Lab Results: Recent Labs    06/30/24 1445 07/01/24 0442  WBC 5.3 5.4  HGB 7.1* 7.9*  HCT 23.6* 24.5*  PLT 262 228   BMET Recent Labs    06/30/24 1449 07/01/24 0442  NA 138 138  K  3.4* 3.5  CL 105 107  CO2 23 23  GLUCOSE 145* 91  BUN 16 15  CREATININE 0.72 0.68  CALCIUM  9.2 8.7*   LFT Recent Labs    06/30/24 1449  PROT 6.2*  ALBUMIN 2.9*  AST 20  ALT 11  ALKPHOS 47  BILITOT 0.5    Studies/Results: DG Chest 2 View Result Date: 06/30/2024 CLINICAL DATA:  SOB EXAM: CHEST - 2 VIEW COMPARISON:  May 05, 2024 FINDINGS: Limited assessment on lateral radiograph secondary to positioning. The cardiomediastinal silhouette is unchanged in contour. No pleural effusion. No pneumothorax. No acute pleuroparenchymal abnormality. Similar gaseous distension of bowel in the upper abdomen. Atherosclerotic calcifications. Multilevel degenerative changes of the thoracic spine. IMPRESSION: No acute cardiopulmonary abnormality. Electronically Signed   By: Corean Salter M.D.   On: 06/30/2024 15:23    Impression: 89 y.o. year old male with history of CVA, DVT, PE in 2023 on Eliquis , diabetes, HTN, PTSD, adenomatous colon polyps, who presented to the emergency room with worsening shortness of breath and generalized fatigue over the last month with worsening symptoms over the last week, found to have hemoglobin of 7.1 with iron deficiency, heme positive stool.  GI consulted for further evaluation of symptomatic anemia.  Symptomatic anemia/heme positive stool/black stool: Admitted with hemoglobin of 7.1, iron saturation 5%, iron 17, ferritin 7.  Stool heme positive, but not grossly bloody per ED note.  Patient reports black stool for at least the last several months though he has been taking oral iron daily since October 2024 due to IDA, per his report.  Also reports poor p.o. intake with unintentional weight loss over the last year, but no other significant GI symptoms.  Denies NSAIDs. No prior EGD.  Last colonoscopy in 2011 with adenomatous colon polyps, 1 of which was a flat polyp in the rectum and only biopsied.  It is unclear if the patient's black stool is truly melena versus  secondary to oral iron, but regardless, he has symptomatic iron deficiency anemia and ideally should have EGD and colonoscopy for further evaluation.  As patient's last dose of Eliquis  was yesterday morning, we should be able to proceed with procedures tomorrow.  Will confirm with Dr. Cindie.  He has received 2 units PRBCs and Hgb improved to 7.9 today which is not as would be expected. Will need to continue to monitor closely and transfuse additional units PRBCs as needed. Encouragingly patient reports last black stool was yesterday.   Lack of appetite/unintentional weight loss:  Patient reports symptoms for the last 1 year. No recent weight to compare to. Last weight on file from 2023 where he was 6 lbs heaver than he was on admission. Notably, patient reports due to fatigue while chewing, he just stops eating which may be influenced by symptomatic anemia. We will rule out gastric cancer, PUD, H pylori, etc at the time of EGD this admission.   Plan: Needs EGD and colonoscopy to evaluate IDA/black stool. This will likely be tomorrow. Will schedule pending discussion with Dr. Cindie.  Continue to hold Eliquis .  Clear liquid diet today.  NPO at midnight.  IV Protonix  40 mg BID.  Hold iron while inpatient.  Continue to monitor H/H and for overt GI bleeding. Transfuse as necessary.    LOS: 1 day    07/01/2024, 10:46 AM   Josette Centers, PA-C [facility]

## 2024-07-01 NOTE — Plan of Care (Signed)

## 2024-07-01 NOTE — Plan of Care (Signed)

## 2024-07-01 NOTE — Progress Notes (Signed)
 PROGRESS NOTE    Dustin Chandler  FMW:[id number] DOB: 21-Jul-1934 DOA: 06/30/2024 PCP: System, Provider Not In    Brief Narrative:  Dustin Chandler is a 89 y.o. male with medical history significant of history of CVA, diabetes, hypertension, history of PE on Eliquis , history of CVA.  Patient seen due to progressive weakness over the last several months.  He got to the point today where he got short of breath just getting dressed and had to stop in the middle.  He denies fevers, chills, cough, chest pain.  He did see his PCP who placed him on iron due to the anemia, which he has been taking.  Here, he was found to be anemic with guaiac positive stool.   Assessment and Plan: Symptomatic anemia -Transfused 2 units -Trend hemoglobin (only rose to 7.9 with 2 units, would have expected patient to be closer to 9) -hold Eliquis   GI bleed -GI consulted -Protonix  -N.p.o. after midnight  History of PE on Eliquis  -Hold Eliquis  due to anemia   Diabetes -Sliding scale insulin   Hypertension -Continue blood pressure control   DVT prophylaxis: SCDs Start: 06/30/24 1642    Code Status: Full Code Family Communication:   Disposition Plan:  Level of care: Med-Surg Status is: Inpatient     Consultants:  GI   Subjective: Feels better after blood transfusion  Objective: Vitals:   06/30/24 2329 07/01/24 0010 07/01/24 0156 07/01/24 0435  BP: (!) 141/70 (!) 142/73 (!) 153/76 (!) 146/80  Pulse: (!) 59 66 63 72  Resp: 16 17 16 16   Temp: 98.9 F (37.2 C) 98.6 F (37 C) 98.3 F (36.8 C) 99.5 F (37.5 C)  TempSrc: Oral Oral Axillary Oral  SpO2: 100% 98% 99% 98%  Weight:      Height:        Intake/Output Summary (Last 24 hours) at 07/01/2024 1051 Last data filed at 07/01/2024 0155 Gross per 24 hour  Intake 952 ml  Output --  Net 952 ml   Filed Weights   06/30/24 1413 06/30/24 1720  Weight: 77.6 kg 72.4 kg    Examination:   General: Appearance:    Well developed,  well nourished male in no acute distress     Lungs:    respirations unlabored  Heart:    Normal heart rate. Normal rhythm. No murmurs, rubs, or gallops.    MS:   All extremities are intact.    Neurologic:   Awake, alert, oriented x 3. No apparent focal neurological           defect.        Data Reviewed: I have personally reviewed following labs and imaging studies  CBC: Recent Labs  Lab 06/30/24 1445 07/01/24 0442  WBC 5.3 5.4  HGB 7.1* 7.9*  HCT 23.6* 24.5*  MCV 88.1 83.3  PLT 262 228   Basic Metabolic Panel: Recent Labs  Lab 06/30/24 1449 07/01/24 0442  NA 138 138  K 3.4* 3.5  CL 105 107  CO2 23 23  GLUCOSE 145* 91  BUN 16 15  CREATININE 0.72 0.68  CALCIUM  9.2 8.7*   GFR: Estimated Creatinine Clearance: 65.4 mL/min (by C-G formula based on SCr of 0.68 mg/dL). Liver Function Tests: Recent Labs  Lab 06/30/24 1449  AST 20  ALT 11  ALKPHOS 47  BILITOT 0.5  PROT 6.2*  ALBUMIN 2.9*   No results for input(s): LIPASE, AMYLASE in the last 168 hours. No results for input(s): AMMONIA in the last 168 hours.  Coagulation Profile: No results for input(s): INR, PROTIME in the last 168 hours. Cardiac Enzymes: No results for input(s): CKTOTAL, CKMB, CKMBINDEX, TROPONINI in the last 168 hours. BNP (last 3 results) No results for input(s): PROBNP in the last 8760 hours. HbA1C: No results for input(s): HGBA1C in the last 72 hours. CBG: Recent Labs  Lab 07/01/24 0009 07/01/24 0602  GLUCAP 91 92   Lipid Profile: No results for input(s): CHOL, HDL, LDLCALC, TRIG, CHOLHDL, LDLDIRECT in the last 72 hours. Thyroid Function Tests: No results for input(s): TSH, T4TOTAL, FREET4, T3FREE, THYROIDAB in the last 72 hours. Anemia Panel: Recent Labs    06/30/24 1451  VITAMINB12 2,294*  FOLATE 7.7  FERRITIN 7*  TIBC 320  IRON 17*  RETICCTPCT 2.3   Sepsis Labs: No results for input(s): PROCALCITON, LATICACIDVEN in the  last 168 hours.  No results found for this or any previous visit (from the past 240 hours).       Radiology Studies: DG Chest 2 View Result Date: 06/30/2024 CLINICAL DATA:  SOB EXAM: CHEST - 2 VIEW COMPARISON:  May 05, 2024 FINDINGS: Limited assessment on lateral radiograph secondary to positioning. The cardiomediastinal silhouette is unchanged in contour. No pleural effusion. No pneumothorax. No acute pleuroparenchymal abnormality. Similar gaseous distension of bowel in the upper abdomen. Atherosclerotic calcifications. Multilevel degenerative changes of the thoracic spine. IMPRESSION: No acute cardiopulmonary abnormality. Electronically Signed   By: Corean Salter M.D.   On: 06/30/2024 15:23        Scheduled Meds:  atorvastatin   40 mg Oral QPM   cloNIDine   0.1 mg Oral Daily   hydrALAZINE   25 mg Oral BID   insulin  aspart  0-15 Units Subcutaneous Q6H   pantoprazole  (PROTONIX ) IV  40 mg Intravenous Q12H   Continuous Infusions:   LOS: 1 day    Time spent: 45 minutes spent on chart review, discussion with nursing staff, consultants, updating family and interview/physical exam; more than 50% of that time was spent in counseling and/or coordination of care.    Harlene RAYMOND Bowl, DO [facility] Available via Epic secure chat 7am-7pm After these hours, please refer to coverage provider listed on [email or web address] 07/01/2024, 10:51 AM

## 2024-07-02 ENCOUNTER — Encounter (HOSPITAL_COMMUNITY): Payer: Self-pay | Admitting: Anesthesiology

## 2024-07-02 DIAGNOSIS — Z7901 Long term (current) use of anticoagulants: Secondary | ICD-10-CM

## 2024-07-02 DIAGNOSIS — D649 Anemia, unspecified: Secondary | ICD-10-CM | POA: Diagnosis not present

## 2024-07-02 LAB — BASIC METABOLIC PANEL WITH GFR
Anion gap: 8 (ref 5–15)
BUN: 12 mg/dL (ref 8–23)
CO2: 23 mmol/L (ref 22–32)
Calcium: 9.3 mg/dL (ref 8.9–10.3)
Chloride: 110 mmol/L (ref 98–111)
Creatinine, Ser: 0.69 mg/dL (ref 0.61–1.24)
GFR, Estimated: >60 mL/min (ref >60)
Glucose, Bld: 76 mg/dL (ref 70–99)
Potassium: 3.7 mmol/L (ref 3.5–5.1)
Sodium: 141 mmol/L (ref 135–145)

## 2024-07-02 LAB — GLUCOSE, CAPILLARY
Glucose-Capillary: 118 mg/dL — ABNORMAL HIGH (ref 70–99)
Glucose-Capillary: 121 mg/dL — ABNORMAL HIGH (ref 70–99)
Glucose-Capillary: 82 mg/dL (ref 70–99)
Glucose-Capillary: 92 mg/dL (ref 70–99)
Glucose-Capillary: 96 mg/dL (ref 70–99)

## 2024-07-02 LAB — CBC
HCT: 31.3 % — ABNORMAL LOW (ref 39.0–52.0)
Hemoglobin: 9.4 g/dL — ABNORMAL LOW (ref 13.0–17.0)
MCH: 25.8 pg — ABNORMAL LOW (ref 26.0–34.0)
MCHC: 30 g/dL (ref 30.0–36.0)
MCV: 86 fL (ref 80.0–100.0)
Platelets: 263 K/uL (ref 150–400)
RBC: 3.64 MIL/uL — ABNORMAL LOW (ref 4.22–5.81)
RDW: 17.4 % — ABNORMAL HIGH (ref 11.5–15.5)
WBC: 8.3 K/uL (ref 4.0–10.5)
nRBC: 0 % (ref 0.0–0.2)

## 2024-07-02 LAB — HEMOGLOBIN A1C
Hgb A1c MFr Bld: 5.6 % (ref 4.8–5.6)
Mean Plasma Glucose: 114 mg/dL

## 2024-07-02 MED ORDER — PEG 3350-KCL-NA BICARB-NACL 420 G PO SOLR
4000.0000 mL | Freq: Once | ORAL | Status: AC
  Administered 2024-07-02: 4000 mL via ORAL

## 2024-07-02 NOTE — Progress Notes (Addendum)
 PROGRESS NOTE    Dustin Chandler  FMW:[id number] DOB: Dec 02, 1934 DOA: 06/30/2024 PCP: System, Provider Not In    Brief Narrative:  Dustin Chandler is a 89 y.o. male with medical history significant of history of CVA, diabetes, hypertension, history of PE on Eliquis , history of CVA.  Patient seen due to progressive weakness over the last several months.  He got to the point where he got short of breath just getting dressed and had to stop in the middle.  He denies fevers, chills, cough, chest pain.  He did see his PCP who placed him on iron due to the anemia, which he has been taking.  Here, he was found to be anemic with guaiac positive stool.  Plan for EGD/ colonoscopy 7/15 but had to be delayed due to poor prep.   Assessment and Plan: Symptomatic anemia -Transfused 2 units -Hgb improved -hold Eliquis   GI bleed -GI consulted -Protonix  -N.p.o. after midnight for EGD and colonoscopy 7/15  History of PE on Eliquis  -Hold Eliquis  due to anemia  Diabetes -Sliding scale insulin   Hypertension -Continue blood pressure control   DVT prophylaxis: SCDs Start: 06/30/24 1642    Code Status: Full Code Family Communication: at bedside  Disposition Plan:  Level of care: Med-Surg Status is: Inpatient     Consultants:  GI   Subjective: Getting bowel prep   Objective: Vitals:   07/01/24 0435 07/01/24 1535 07/01/24 1933 07/02/24 0343  BP: (!) 146/80 (!) 146/72 (!) 151/77 (!) 142/88  Pulse: 72 65 62 73  Resp: 16 18 16 14   Temp: 99.5 F (37.5 C) 99.2 F (37.3 C) (!) 100.4 F (38 C) 98.7 F (37.1 C)  TempSrc: Oral Oral Oral Oral  SpO2: 98% 99% 92% 100%  Weight:      Height:        Intake/Output Summary (Last 24 hours) at 07/02/2024 1014 Last data filed at 07/02/2024 0100 Gross per 24 hour  Intake 4000 ml  Output --  Net 4000 ml   Filed Weights   06/30/24 1413 06/30/24 1720  Weight: 77.6 kg 72.4 kg    Examination:   General: Appearance:    Well developed,  well nourished male in no acute distress     Lungs:    respirations unlabored  Heart:    Normal heart rate.   MS:   All extremities are intact.    Neurologic:   sleepy       Data Reviewed: I have personally reviewed following labs and imaging studies  CBC: Recent Labs  Lab 06/30/24 1445 07/01/24 0442 07/01/24 1528 07/02/24 0440  WBC 5.3 5.4  --  8.3  HGB 7.1* 7.9* 9.0* 9.4*  HCT 23.6* 24.5* 28.6* 31.3*  MCV 88.1 83.3  --  86.0  PLT 262 228  --  263   Basic Metabolic Panel: Recent Labs  Lab 06/30/24 1449 07/01/24 0442 07/02/24 0440  NA 138 138 141  K 3.4* 3.5 3.7  CL 105 107 110  CO2 23 23 23   GLUCOSE 145* 91 76  BUN 16 15 12   CREATININE 0.72 0.68 0.69  CALCIUM  9.2 8.7* 9.3   GFR: Estimated Creatinine Clearance: 65.4 mL/min (by C-G formula based on SCr of 0.69 mg/dL). Liver Function Tests: Recent Labs  Lab 06/30/24 1449  AST 20  ALT 11  ALKPHOS 47  BILITOT 0.5  PROT 6.2*  ALBUMIN 2.9*   No results for input(s): LIPASE, AMYLASE in the last 168 hours. No results for input(s): AMMONIA in  the last 168 hours. Coagulation Profile: No results for input(s): INR, PROTIME in the last 168 hours. Cardiac Enzymes: No results for input(s): CKTOTAL, CKMB, CKMBINDEX, TROPONINI in the last 168 hours. BNP (last 3 results) No results for input(s): PROBNP in the last 8760 hours. HbA1C: No results for input(s): HGBA1C in the last 72 hours. CBG: Recent Labs  Lab 07/01/24 1729 07/01/24 1943 07/02/24 0009 07/02/24 0540 07/02/24 0917  GLUCAP 129* 112* 121* 82 92   Lipid Profile: No results for input(s): CHOL, HDL, LDLCALC, TRIG, CHOLHDL, LDLDIRECT in the last 72 hours. Thyroid Function Tests: No results for input(s): TSH, T4TOTAL, FREET4, T3FREE, THYROIDAB in the last 72 hours. Anemia Panel: Recent Labs    06/30/24 1451  VITAMINB12 2,294*  FOLATE 7.7  FERRITIN 7*  TIBC 320  IRON 17*  RETICCTPCT 2.3   Sepsis  Labs: No results for input(s): PROCALCITON, LATICACIDVEN in the last 168 hours.  No results found for this or any previous visit (from the past 240 hours).       Radiology Studies: DG Chest 2 View Result Date: 06/30/2024 CLINICAL DATA:  SOB EXAM: CHEST - 2 VIEW COMPARISON:  May 05, 2024 FINDINGS: Limited assessment on lateral radiograph secondary to positioning. The cardiomediastinal silhouette is unchanged in contour. No pleural effusion. No pneumothorax. No acute pleuroparenchymal abnormality. Similar gaseous distension of bowel in the upper abdomen. Atherosclerotic calcifications. Multilevel degenerative changes of the thoracic spine. IMPRESSION: No acute cardiopulmonary abnormality. Electronically Signed   By: Corean Salter M.D.   On: 06/30/2024 15:23        Scheduled Meds:  atorvastatin   40 mg Oral QPM   cloNIDine   0.1 mg Oral Daily   hydrALAZINE   25 mg Oral BID   insulin  aspart  0-15 Units Subcutaneous Q6H   pantoprazole  (PROTONIX ) IV  40 mg Intravenous Q12H   thiamine   100 mg Oral Daily   Continuous Infusions:  sodium chloride      sodium chloride        LOS: 2 days    Time spent: 45 minutes spent on chart review, discussion with nursing staff, consultants, updating family and interview/physical exam; more than 50% of that time was spent in counseling and/or coordination of care.    Harlene RAYMOND Bowl, DO [facility] Available via Epic secure chat 7am-7pm After these hours, please refer to coverage provider listed on [email or web address] 07/02/2024, 10:14 AM

## 2024-07-02 NOTE — Progress Notes (Signed)
 Patient presenting persistent stools despite taking bowel prep, will repeat prep today and proceed with EGD and colonoscopy tomorrow.

## 2024-07-02 NOTE — Plan of Care (Signed)
   Problem: Activity: Goal: Risk for activity intolerance will decrease Outcome: Progressing

## 2024-07-03 ENCOUNTER — Encounter (HOSPITAL_COMMUNITY)
Admission: EM | Disposition: A | Discharge status: Home / Self Care | Payer: Self-pay | Source: Home / Self Care | Attending: Internal Medicine

## 2024-07-03 ENCOUNTER — Inpatient Hospital Stay (HOSPITAL_COMMUNITY): Admitting: Anesthesiology

## 2024-07-03 ENCOUNTER — Encounter (HOSPITAL_COMMUNITY): Payer: Self-pay | Admitting: Anesthesiology

## 2024-07-03 ENCOUNTER — Encounter (HOSPITAL_COMMUNITY)
Admission: EM | Disposition: A | Discharge status: Home / Self Care | Payer: Self-pay | Source: Home / Self Care | Attending: Family Medicine

## 2024-07-03 DIAGNOSIS — E1159 Type 2 diabetes mellitus with other circulatory complications: Secondary | ICD-10-CM | POA: Diagnosis not present

## 2024-07-03 DIAGNOSIS — Z7901 Long term (current) use of anticoagulants: Secondary | ICD-10-CM

## 2024-07-03 DIAGNOSIS — D509 Iron deficiency anemia, unspecified: Secondary | ICD-10-CM

## 2024-07-03 DIAGNOSIS — Z86711 Personal history of pulmonary embolism: Secondary | ICD-10-CM

## 2024-07-03 DIAGNOSIS — K5730 Diverticulosis of large intestine without perforation or abscess without bleeding: Secondary | ICD-10-CM

## 2024-07-03 DIAGNOSIS — K921 Melena: Secondary | ICD-10-CM

## 2024-07-03 DIAGNOSIS — I10 Essential (primary) hypertension: Secondary | ICD-10-CM

## 2024-07-03 DIAGNOSIS — D649 Anemia, unspecified: Secondary | ICD-10-CM | POA: Diagnosis not present

## 2024-07-03 LAB — GLUCOSE, CAPILLARY
Glucose-Capillary: 104 mg/dL — ABNORMAL HIGH (ref 70–99)
Glucose-Capillary: 109 mg/dL — ABNORMAL HIGH (ref 70–99)
Glucose-Capillary: 118 mg/dL — ABNORMAL HIGH (ref 70–99)
Glucose-Capillary: 137 mg/dL — ABNORMAL HIGH (ref 70–99)
Glucose-Capillary: 139 mg/dL — ABNORMAL HIGH (ref 70–99)

## 2024-07-03 SURGERY — EGD (ESOPHAGOGASTRODUODENOSCOPY)
Anesthesia: General

## 2024-07-03 SURGERY — EGD (ESOPHAGOGASTRODUODENOSCOPY)
Anesthesia: Choice

## 2024-07-03 MED ORDER — BOOST / RESOURCE BREEZE PO LIQD CUSTOM
1.0000 | Freq: Three times a day (TID) | ORAL | Status: DC
  Administered 2024-07-03 – 2024-07-04 (×2): 237 mL via ORAL
  Administered 2024-07-05: 1 via ORAL

## 2024-07-03 NOTE — Discharge Instructions (Signed)
  Colonoscopy Discharge Instructions  Read the instructions outlined below and refer to this sheet in the next few weeks. These discharge instructions provide you with general information on caring for yourself after you leave the hospital. Your doctor may also give you specific instructions. While your treatment has been planned according to the most current medical practices available, unavoidable complications occasionally occur. If you have any problems or questions after discharge, call Dr. Shaaron at [phone]. ACTIVITY You may resume your regular activity, but move at a slower pace for the next 24 hours.  Take frequent rest periods for the next 24 hours.  Walking will help get rid of the air and reduce the bloated feeling in your belly (abdomen).  No driving for 24 hours (because of the medicine (anesthesia) used during the test).   Do not sign any important legal documents or operate any machinery for 24 hours (because of the anesthesia used during the test).  NUTRITION Drink plenty of fluids.  You may resume your normal diet as instructed by your doctor.  Begin with a light meal and progress to your normal diet. Heavy or fried foods are harder to digest and may make you feel sick to your stomach (nauseated).  Avoid alcoholic beverages for 24 hours or as instructed.  MEDICATIONS You may resume your normal medications unless your doctor tells you otherwise.  WHAT YOU CAN EXPECT TODAY Some feelings of bloating in the abdomen.  Passage of more gas than usual.  Spotting of blood in your stool or on the toilet paper.  IF YOU HAD POLYPS REMOVED DURING THE COLONOSCOPY: No aspirin  products for 7 days or as instructed.  No alcohol for 7 days or as instructed.  Eat a soft diet for the next 24 hours.  FINDING OUT THE RESULTS OF YOUR TEST Not all test results are available during your visit. If your test results are not back during the visit, make an appointment with your caregiver to find out the  results. Do not assume everything is normal if you have not heard from your caregiver or the medical facility. It is important for you to follow up on all of your test results.  SEEK IMMEDIATE MEDICAL ATTENTION IF: You have more than a spotting of blood in your stool.  Your belly is swollen (abdominal distention).  You are nauseated or vomiting.  You have a temperature over 101.  You have abdominal pain or discomfort that is severe or gets worse throughout the day.       Found blood blisters in your colon.  No polyps or cancer.  I coagulated the blood blister so they would not bleed.  Office visit with us  in 6 weeks.  No need to repeat your upper endoscopy today

## 2024-07-03 NOTE — Hospital Course (Signed)
 89 y.o. male with medical history significant of history of CVA, diabetes, hypertension, history of PE on Eliquis , history of CVA.  Patient seen due to progressive weakness over the last several months.  He got to the point where he got short of breath just getting dressed and had to stop in the middle.  He denies fevers, chills, cough, chest pain.  He did see his PCP who placed him on iron due to the anemia, which he has been taking.  Here, he was found to be anemic with guaiac positive stool.  Plan for EGD/ colonoscopy 7/15 but had to be delayed due to poor prep.

## 2024-07-03 NOTE — Plan of Care (Signed)
   Problem: Coping: Goal: Level of anxiety will decrease Outcome: Progressing

## 2024-07-03 NOTE — Progress Notes (Signed)
 Water Enema was given to pt, prep for coloscopy

## 2024-07-03 NOTE — Anesthesia Preprocedure Evaluation (Signed)
 Anesthesia Evaluation  Patient identified by MRN, date of birth, ID band Patient awake    Reviewed: Allergy & Precautions, H&P , NPO status , Patient's Chart, lab work & pertinent test results, reviewed documented beta blocker date and time   Airway Mallampati: II  TM Distance: >3 FB Neck ROM: full    Dental   Pulmonary former smoker, PE   Pulmonary exam normal breath sounds clear to auscultation       Cardiovascular hypertension, Normal cardiovascular exam Rhythm:regular Rate:Normal     Neuro/Psych  PSYCHIATRIC DISORDERS Anxiety     CVA    GI/Hepatic Neg liver ROS,,,GI bleeding   Endo/Other  diabetes, Type 2    Renal/GU negative Renal ROS  negative genitourinary   Musculoskeletal   Abdominal   Peds  Hematology  (+) Blood dyscrasia, anemia Hgb 9.4   Anesthesia Other Findings   Reproductive/Obstetrics negative OB ROS                              Anesthesia Physical Anesthesia Plan  ASA: 3  Anesthesia Plan: General   Post-op Pain Management: Minimal or no pain anticipated   Induction: Intravenous  PONV Risk Score and Plan: Propofol  infusion  Airway Management Planned: Nasal Cannula and Natural Airway  Additional Equipment: None  Intra-op Plan:   Post-operative Plan:   Informed Consent: I have reviewed the patients History and Physical, chart, labs and discussed the procedure including the risks, benefits and alternatives for the proposed anesthesia with the patient or authorized representative who has indicated his/her understanding and acceptance.     Dental Advisory Given  Plan Discussed with: CRNA  Anesthesia Plan Comments:         Anesthesia Quick Evaluation

## 2024-07-03 NOTE — Progress Notes (Signed)
 PROGRESS NOTE   Dustin Chandler  FMW:[id number] DOB: [birth year] DOA: 06/30/2024 PCP: System, Provider Not In   Chief Complaint  Patient presents with   Shortness of Breath   Fatigue   Level of care: Med-Surg  Brief Admission History:   89 y.o. male with medical history significant of history of CVA, diabetes, hypertension, history of PE on Eliquis , history of CVA.  Patient seen due to progressive weakness over the last several months.  He got to the point where he got short of breath just getting dressed and had to stop in the middle.  He denies fevers, chills, cough, chest pain.  He did see his PCP who placed him on iron due to the anemia, which he has been taking.  Here, he was found to be anemic with guaiac positive stool.  Plan for EGD/ colonoscopy 7/15 but had to be delayed due to poor prep.    Assessment and Plan:  Symptomatic anemia  GI hemorrhage  -- GI team working hard to get him adequately prepped for EGD/Colon -- procedures postponed last 2 days due to poor prep -- updated family  History of PE -- apixaban  on hold  Type 2 DM  -- remains on SSI coverage  CBG (last 3)  Recent Labs    07/03/24 0338 07/03/24 1135 07/03/24 1743  GLUCAP 118* 109* 137*    Essential HTN -- optimal control at this time, follow   DVT prophylaxis: SCDs Code Status: Full  Family Communication: bedside updates  Disposition: home   Consultants:  GI Procedures:   Antimicrobials:    Subjective: Pt was nonverbal  Objective: Vitals:   07/02/24 1404 07/02/24 1604 07/03/24 0343 07/03/24 1500  BP: (!) 141/69  (!) 145/75 (!) 160/66  Pulse:   (!) 57 66  Resp: 18  14 16   Temp:  98.1 F (36.7 C) 97.9 F (36.6 C) 97.8 F (36.6 C)  TempSrc:  Oral Oral Oral  SpO2: 98%  96% 97%  Weight:      Height:        Intake/Output Summary (Last 24 hours) at 07/03/2024 1905 Last data filed at 07/03/2024 1700 Gross per 24 hour  Intake --  Output 900 ml  Net -900 ml   Filed Weights    06/30/24 1413 06/30/24 1720  Weight: 77.6 kg 72.4 kg   Examination:  General exam: Appears calm and comfortable pt appears chronically ill.  Respiratory system: Clear to auscultation. Respiratory effort normal. Cardiovascular system: normal S1 & S2 heard. No JVD, murmurs, rubs, gallops or clicks. No pedal edema. Gastrointestinal system: Abdomen is nondistended, soft and nontender. No organomegaly or masses felt. Normal bowel sounds heard. Central nervous system: Alert and oriented. No focal neurological deficits. Extremities: Symmetric 5 x 5 power. Skin: No rashes, lesions or ulcers. Psychiatry: Judgement and insight appear normal. Mood & affect appropriate.   Data Reviewed: I have personally reviewed following labs and imaging studies  CBC: Recent Labs  Lab 06/30/24 1445 07/01/24 0442 07/01/24 1528 07/02/24 0440  WBC 5.3 5.4  --  8.3  HGB 7.1* 7.9* 9.0* 9.4*  HCT 23.6* 24.5* 28.6* 31.3*  MCV 88.1 83.3  --  86.0  PLT 262 228  --  263    Basic Metabolic Panel: Recent Labs  Lab 06/30/24 1449 07/01/24 0442 07/02/24 0440  NA 138 138 141  K 3.4* 3.5 3.7  CL 105 107 110  CO2 23 23 23   GLUCOSE 145* 91 76  BUN 16 15 12   CREATININE  0.72 0.68 0.69  CALCIUM  9.2 8.7* 9.3    CBG: Recent Labs  Lab 07/02/24 1715 07/03/24 0022 07/03/24 0338 07/03/24 1135 07/03/24 1743  GLUCAP 118* 139* 118* 109* 137*    No results found for this or any previous visit (from the past 240 hours).   Radiology Studies: No results found.  Scheduled Meds:  atorvastatin   40 mg Oral QPM   cloNIDine   0.1 mg Oral Daily   feeding supplement  1 Container Oral TID BM   hydrALAZINE   25 mg Oral BID   insulin  aspart  0-15 Units Subcutaneous Q6H   pantoprazole  (PROTONIX ) IV  40 mg Intravenous Q12H   thiamine   100 mg Oral Daily   Continuous Infusions:   LOS: 3 days   Time spent: 55 mins  Alisse Tuite Vicci, MD How to contact the [facility] Attending or Consulting provider 7A - 7P or covering  provider during after hours 7P -7A, for this patient?  Check the care team in [facility] and look for a) attending/consulting TRH provider listed and b) the TRH team listed Log into [email or web address] to find provider on call.  Locate the TRH provider you are looking for under [facility] and page to a number that you can be directly reached. If you still have difficulty reaching the provider, please page the [facility] (Director on Call) for the Hospitalists listed on amion for assistance.  07/03/2024, 7:05 PM

## 2024-07-03 NOTE — Progress Notes (Signed)
 The patient has not yet been adequately purged for a colonoscopy.    He is not running clear.  Discussed with the staff on multiple occasions today.  Will give him additional MiraLAX  this evening and tentatively plan for both an EGD and colonoscopy tomorrow.

## 2024-07-04 ENCOUNTER — Inpatient Hospital Stay (HOSPITAL_COMMUNITY): Admitting: Certified Registered Nurse Anesthetist

## 2024-07-04 ENCOUNTER — Encounter (HOSPITAL_COMMUNITY)
Admission: EM | Discharge status: Home / Self Care | Payer: Self-pay | Source: Home / Self Care | Attending: Internal Medicine

## 2024-07-04 ENCOUNTER — Other Ambulatory Visit: Payer: Self-pay

## 2024-07-04 ENCOUNTER — Encounter (HOSPITAL_COMMUNITY): Payer: Self-pay | Admitting: Family Medicine

## 2024-07-04 DIAGNOSIS — C163 Malignant neoplasm of pyloric antrum: Secondary | ICD-10-CM | POA: Diagnosis not present

## 2024-07-04 DIAGNOSIS — Z86711 Personal history of pulmonary embolism: Secondary | ICD-10-CM | POA: Diagnosis not present

## 2024-07-04 DIAGNOSIS — K5730 Diverticulosis of large intestine without perforation or abscess without bleeding: Secondary | ICD-10-CM

## 2024-07-04 DIAGNOSIS — E1159 Type 2 diabetes mellitus with other circulatory complications: Secondary | ICD-10-CM | POA: Diagnosis not present

## 2024-07-04 DIAGNOSIS — I10 Essential (primary) hypertension: Secondary | ICD-10-CM

## 2024-07-04 DIAGNOSIS — K3189 Other diseases of stomach and duodenum: Secondary | ICD-10-CM

## 2024-07-04 DIAGNOSIS — K3182 Dieulafoy lesion (hemorrhagic) of stomach and duodenum: Secondary | ICD-10-CM

## 2024-07-04 DIAGNOSIS — D649 Anemia, unspecified: Secondary | ICD-10-CM | POA: Diagnosis not present

## 2024-07-04 DIAGNOSIS — Z7901 Long term (current) use of anticoagulants: Secondary | ICD-10-CM | POA: Diagnosis not present

## 2024-07-04 HISTORY — PX: ESOPHAGOGASTRODUODENOSCOPY: SHX5428

## 2024-07-04 HISTORY — PX: COLONOSCOPY: SHX5424

## 2024-07-04 LAB — CBC
HCT: 26.9 % — ABNORMAL LOW (ref 39.0–52.0)
Hemoglobin: 8.1 g/dL — ABNORMAL LOW (ref 13.0–17.0)
MCH: 25.8 pg — ABNORMAL LOW (ref 26.0–34.0)
MCHC: 30.1 g/dL (ref 30.0–36.0)
MCV: 85.7 fL (ref 80.0–100.0)
Platelets: 221 K/uL (ref 150–400)
RBC: 3.14 MIL/uL — ABNORMAL LOW (ref 4.22–5.81)
RDW: 16.9 % — ABNORMAL HIGH (ref 11.5–15.5)
WBC: 6.3 K/uL (ref 4.0–10.5)
nRBC: 0 % (ref 0.0–0.2)

## 2024-07-04 LAB — BASIC METABOLIC PANEL WITH GFR
Anion gap: 8 (ref 5–15)
BUN: 7 mg/dL — ABNORMAL LOW (ref 8–23)
CO2: 22 mmol/L (ref 22–32)
Calcium: 8.6 mg/dL — ABNORMAL LOW (ref 8.9–10.3)
Chloride: 108 mmol/L (ref 98–111)
Creatinine, Ser: 0.63 mg/dL (ref 0.61–1.24)
GFR, Estimated: >60 mL/min (ref >60)
Glucose, Bld: 87 mg/dL (ref 70–99)
Potassium: 3.5 mmol/L (ref 3.5–5.1)
Sodium: 138 mmol/L (ref 135–145)

## 2024-07-04 LAB — GLUCOSE, CAPILLARY
Glucose-Capillary: 97 mg/dL (ref 70–99)
Glucose-Capillary: 103 mg/dL — ABNORMAL HIGH (ref 70–99)
Glucose-Capillary: 87 mg/dL (ref 70–99)
Glucose-Capillary: 91 mg/dL (ref 70–99)
Glucose-Capillary: 93 mg/dL (ref 70–99)

## 2024-07-04 SURGERY — EGD (ESOPHAGOGASTRODUODENOSCOPY)
Anesthesia: General

## 2024-07-04 MED ORDER — PROPOFOL 500 MG/50ML IV EMUL
INTRAVENOUS | Status: DC | PRN
  Administered 2024-07-04: 150 ug/kg/min via INTRAVENOUS

## 2024-07-04 MED ORDER — LACTATED RINGERS IV SOLN: INTRAVENOUS | Status: DC

## 2024-07-04 MED ORDER — POLYETHYLENE GLYCOL 3350 17 G PO PACK
119.0000 g | PACK | Freq: Once | ORAL | Status: AC
  Administered 2024-07-04: 119 g via ORAL
  Filled 2024-07-04: qty 7

## 2024-07-04 MED ORDER — SODIUM CHLORIDE 0.9 % IV SOLN: INTRAVENOUS | Status: DC

## 2024-07-04 MED ORDER — SODIUM CHLORIDE 0.9% FLUSH: 3.0000 mL | INTRAVENOUS | Status: DC | PRN

## 2024-07-04 MED ORDER — BISACODYL 5 MG PO TBEC
10.0000 mg | DELAYED_RELEASE_TABLET | ORAL | Status: AC
  Administered 2024-07-04: 10 mg via ORAL
  Filled 2024-07-04: qty 2

## 2024-07-04 MED ORDER — PHENYLEPHRINE 80 MCG/ML (10ML) SYRINGE FOR IV PUSH (FOR BLOOD PRESSURE SUPPORT)
PREFILLED_SYRINGE | INTRAVENOUS | Status: DC | PRN
  Administered 2024-07-04: 160 ug via INTRAVENOUS

## 2024-07-04 MED ORDER — PROPOFOL 10 MG/ML IV BOLUS
INTRAVENOUS | Status: DC | PRN
Start: 2024-07-04 — End: 2024-07-04
  Administered 2024-07-04: 60 mg via INTRAVENOUS

## 2024-07-04 MED ORDER — LIDOCAINE 2% (20 MG/ML) 5 ML SYRINGE
INTRAMUSCULAR | Status: DC | PRN
  Administered 2024-07-04: 100 mg via INTRAVENOUS

## 2024-07-04 MED ORDER — SODIUM CHLORIDE 0.9% FLUSH: 3.0000 mL | Freq: Two times a day (BID) | INTRAVENOUS | Status: DC

## 2024-07-04 NOTE — Progress Notes (Signed)
 Patient stools still remain dark brown as of this morning, although stools are completely liquid and very small at this time. Miralax  dose given this morning to continue bowel prep regimen. Patient slept well with no complaints of pain or discomfort, wife present at bedside.

## 2024-07-04 NOTE — Transfer of Care (Signed)
 Immediate Anesthesia Transfer of Care Note  Patient: Dustin Chandler  Procedure(s) Performed: EGD (ESOPHAGOGASTRODUODENOSCOPY) COLONOSCOPY  Patient Location: PACU  Anesthesia Type:General  Level of Consciousness: awake, alert , oriented, and patient cooperative  Airway & Oxygen Therapy: Patient Spontanous Breathing  Post-op Assessment: Report given to RN, Post -op Vital signs reviewed and stable, and Patient moving all extremities X 4  Post vital signs: Reviewed and stable  Last Vitals:  Vitals Value Taken Time  BP 99/63 07/04/24 16:32  Temp 36.5 C 07/04/24 16:32  Pulse 68 07/04/24 16:36  Resp 16 07/04/24 16:36  SpO2 100 % 07/04/24 16:36  Vitals shown include unfiled device data.  Last Pain:  Vitals:   07/04/24 1531  TempSrc:   PainSc: 0-No pain         Complications: No notable events documented.

## 2024-07-04 NOTE — Progress Notes (Signed)
 PROGRESS NOTE   Dustin Chandler  FMW:[id number] DOB: [birth year] DOA: 06/30/2024 PCP: System, Provider Not In   Chief Complaint  Patient presents with   Shortness of Breath   Fatigue   Level of care: Med-Surg  Brief Admission History:   89 y.o. male with medical history significant of history of CVA, diabetes, hypertension, history of PE on Eliquis , history of CVA.  Patient seen due to progressive weakness over the last several months.  He got to the point where he got short of breath just getting dressed and had to stop in the middle.  He denies fevers, chills, cough, chest pain.  He did see his PCP who placed him on iron due to the anemia, which he has been taking.  Here, he was found to be anemic with guaiac positive stool.  Plan for EGD/ colonoscopy 7/15 but had to be delayed due to poor prep.    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   -- GI team recommending surgery and oncology evaluation  History of PE -- apixaban  on hold  Type 2 DM  -- remains on SSI coverage  CBG (last 3)  Recent Labs    07/04/24 1150 07/04/24 1345 07/04/24 1634  GLUCAP 103* 87 91   Essential HTN -- optimal control at this time, follow   DVT prophylaxis: SCDs Code Status: Full  Family Communication: bedside updates  Disposition: home   Consultants:  GI Procedures:   Antimicrobials:    Subjective: Pt seen prior to EGD/colon  Objective: Vitals:   07/04/24 1632 07/04/24 1634 07/04/24 1645 07/04/24 1708  BP: 99/63  118/74 (!) 169/88  Pulse: 70 68 70 (!) 58  Resp: 17 16 (!) 24   Temp: 97.7 F (36.5 C)  97.6 F (36.4 C) (!) 97.5 F (36.4 C)  TempSrc:    Oral  SpO2: 98% 99% 100% 100%  Weight:      Height:        Intake/Output Summary (Last 24 hours) at 07/04/2024 1713 Last data filed at 07/04/2024 1632 Gross per 24  hour  Intake 500 ml  Output 4170 ml  Net -3670 ml   Filed Weights   06/30/24 1413 06/30/24 1720 07/04/24 1348  Weight: 77.6 kg 72.4 kg 72.4 kg   Examination:  General exam: Appears calm and comfortable pt appears chronically ill.  Respiratory system: Clear to auscultation. Respiratory effort normal. Cardiovascular system: normal S1 & S2 heard. No JVD, murmurs, rubs, gallops or clicks. No pedal edema. Gastrointestinal system: Abdomen is nondistended, soft and nontender. No organomegaly or masses felt. Normal bowel sounds heard. Central nervous system: Alert and oriented. No focal neurological deficits. Extremities: Symmetric 5 x 5 power. Skin: No rashes, lesions or ulcers. Psychiatry: Judgement and insight appear normal. Mood & affect appropriate.   Data Reviewed: I have personally reviewed following labs and imaging studies  CBC: Recent Labs  Lab 06/30/24 1445 07/01/24 0442 07/01/24 1528 07/02/24 0440 07/04/24 0500  WBC 5.3 5.4  --  8.3 6.3  HGB 7.1* 7.9* 9.0* 9.4* 8.1*  HCT 23.6* 24.5* 28.6* 31.3* 26.9*  MCV 88.1 83.3  --  86.0 85.7  PLT 262 228  --  263 221    Basic Metabolic Panel: Recent Labs  Lab 06/30/24 1449 07/01/24 0442 07/02/24 0440 07/04/24 0500  NA 138 138 141 138  K 3.4* 3.5 3.7  3.5  CL 105 107 110 108  CO2 23 23 23 22   GLUCOSE 145* 91 76 87  BUN 16 15 12  7*  CREATININE 0.72 0.68 0.69 0.63  CALCIUM  9.2 8.7* 9.3 8.6*    CBG: Recent Labs  Lab 07/03/24 2352 07/04/24 0505 07/04/24 1150 07/04/24 1345 07/04/24 1634  GLUCAP 104* 97 103* 87 91    No results found for this or any previous visit (from the past 240 hours).   Radiology Studies: No results found.  Scheduled Meds:  atorvastatin   40 mg Oral QPM   cloNIDine   0.1 mg Oral Daily   feeding supplement  1 Container Oral TID BM   hydrALAZINE   25 mg Oral BID   insulin  aspart  0-15 Units Subcutaneous Q6H   pantoprazole  (PROTONIX ) IV  40 mg Intravenous Q12H   thiamine   100 mg Oral Daily    Continuous Infusions:   LOS: 4 days   Time spent: 55 mins  Hayden Mabin Vicci, MD How to contact the [facility] Attending or Consulting provider 7A - 7P or covering provider during after hours 7P -7A, for this patient?  Check the care team in [facility] and look for a) attending/consulting TRH provider listed and b) the TRH team listed Log into [email or web address] to find provider on call.  Locate the TRH provider you are looking for under [facility] and page to a number that you can be directly reached. If you still have difficulty reaching the provider, please page the [facility] (Director on Call) for the Hospitalists listed on amion for assistance.  07/04/2024, 5:13 PM

## 2024-07-04 NOTE — Anesthesia Postprocedure Evaluation (Signed)
 Anesthesia Post Note  Patient: Dustin Chandler  Procedure(s) Performed: EGD (ESOPHAGOGASTRODUODENOSCOPY) COLONOSCOPY  Patient location during evaluation: Phase II Anesthesia Type: General Level of consciousness: awake and alert Pain management: pain level controlled Vital Signs Assessment: post-procedure vital signs reviewed and stable Respiratory status: spontaneous breathing, nonlabored ventilation and respiratory function stable Cardiovascular status: blood pressure returned to baseline and stable Postop Assessment: no apparent nausea or vomiting Anesthetic complications: no   There were no known notable events for this encounter.   Last Vitals:  Vitals:   07/04/24 1645 07/04/24 1708  BP: 118/74 (!) 169/88  Pulse: 70 (!) 58  Resp: (!) 24   Temp: 36.4 C (!) 36.4 C  SpO2: 100% 100%    Last Pain:  Vitals:   07/04/24 1708  TempSrc: Oral  PainSc:                  Carlin LITTIE Kawasaki

## 2024-07-04 NOTE — Op Note (Signed)
 [facility] Patient Name: Dustin Chandler Procedure Date: 07/04/2024 3:21 PM MRN: [id number] Date of Birth: 23-Dec-1933 Attending MD: Deatrice Dine , MD, [id number] CSN: [id number] Age: 89 Admit Type: Inpatient Procedure:                Upper GI endoscopy Indications:              Acute post hemorrhagic anemia, Melena Providers:                Deatrice Dine, MD, Jon LABOR. Gerome RN, RN,                            Jon Loge Referring MD:              Medicines:                Monitored Anesthesia Care Complications:            No immediate complications. Estimated Blood Loss:     Estimated blood loss was minimal. Procedure:                Pre-Anesthesia Assessment:                           - Prior to the procedure, a History and Physical                            was performed, and patient medications and                            allergies were reviewed. The patient's tolerance of                            previous anesthesia was also reviewed. The risks                            and benefits of the procedure and the sedation                            options and risks were discussed with the patient.                            All questions were answered, and informed consent                            was obtained. Prior Anticoagulants: The patient has                            taken Eliquis  (apixaban ), last dose was 3 days                            prior to procedure. ASA Grade Assessment: III - A                            patient with severe systemic disease. After  reviewing the risks and benefits, the patient was                            deemed in satisfactory condition to undergo the                            procedure.                           After obtaining informed consent, the endoscope was                            passed under direct vision. Throughout the                            procedure, the patient's blood  pressure, pulse, and                            oxygen saturations were monitored continuously. The                            [id number]-H190 ([id number]) scope was introduced through the                            mouth, and advanced to the second part of duodenum.                            The upper GI endoscopy was accomplished without                            difficulty. The patient tolerated the procedure                            well. Scope In: 3:52:56 PM Scope Out: 4:09:06 PM Total Procedure Duration: 0 hours 16 minutes 10 seconds  Findings:      The examined esophagus was normal.      A Dieulafoy lesion with oozing bleeding and stigmata of recent bleeding       was found at the incisura. For hemostasis, three hemostatic clips were       successfully placed (MR conditional). Clip manufacturer: [employer]. There was no bleeding at the end of the procedure.      A large, fungating and ulcerated, circumferential mass with no bleeding       and stigmata of recent bleeding was found in the gastric antrum.       Biopsies were taken with a cold forceps for histology.      The duodenal bulb and second portion of the duodenum were normal. Impression:               - Normal esophagus.                           -Acttively bleeding Dieulafoy lesion of stomach.  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                            Biopsied x 8                           - Normal duodenal bulb and second portion of the                            duodenum. Moderate Sedation:      Per Anesthesia Care Recommendation:           - Await pathology results.                           - Establish goals of care                           -Surgical and oncology evaluation Procedure Code(s):        --- Professional ---                           [phone], 59, Esophagogastroduodenoscopy, flexible,                             transoral; with control of bleeding, any method                           43239, Esophagogastroduodenoscopy, flexible,                            transoral; with biopsy, single or multiple Diagnosis Code(s):        --- Professional ---                           K31.82, Dieulafoy lesion (hemorrhagic) of stomach                            and duodenum                           C16.3, Malignant neoplasm of pyloric antrum                           D62, Acute posthemorrhagic anemia                           K92.1, Melena (includes Hematochezia) CPT copyright 2022 American Medical Association. All rights reserved. The codes documented in this report are preliminary and upon coder review may  be revised to meet current compliance requirements. Deatrice Dine, MD Deatrice Dine, MD 07/04/2024 4:34:05 PM This report has been signed electronically. Number of Addenda: 0

## 2024-07-04 NOTE — Interval H&P Note (Signed)
 History and Physical Interval Note:  07/04/2024 2:50 PM  Dustin Chandler  has presented today for surgery, with the diagnosis of iron deficiency anemia.  The various methods of treatment have been discussed with the patient and family. After consideration of risks, benefits and other options for treatment, the patient has consented to  Procedure(s): EGD (ESOPHAGOGASTRODUODENOSCOPY) (N/A) COLONOSCOPY (N/A) as a surgical intervention.  The patient's history has been reviewed, patient examined, no change in status, stable for surgery.  I have reviewed the patient's chart and labs.  Questions were answered to the patient's satisfaction.    We will proceed with EGD/Colonoscopy as scheduled.    I thoroughly discussed with the patient the procedure, including the risks involved. Patient understands what the procedure involves including the benefits and any risks. Patient understands alternatives to the proposed procedure. Risks including (but not limited to) bleeding, tearing of the lining (perforation), rupture of adjacent organs, problems with heart and lung function, infection, and medication reactions. A small percentage of complications may require surgery, hospitalization, repeat endoscopic procedure, and/or transfusion.  Patient understood and agreed.    Deatrice FALCON Dustin Chandler

## 2024-07-04 NOTE — Brief Op Note (Signed)
 Patient underwent EGD and Colonoscopy  under propofol  sedation.  Tolerated the procedure adequately.   FINDINGS:  EGD:  - Normal esophagus.  - Actively bleeding Dieulafoy lesion of stomach. 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 Biopsied x 8  - Normal duodenal bulb and second portion of the duodenum.  Colonoscopy:  - Preparation of the colon was fair.  - Diverticulosis in the entire examined colon.  - Stool in the rectum, with inadequate exam of the rectum.  - No specimens collected.  - No evidence of old or active bleeding throughout the examined colon ; brown stools   RECOMMENDATIONS  - Await pathology results.  - Establish goals of care  - Surgical and oncology evaluation  I spoke with patient family and wife over the phone detailing the findings of the procedure   Dustin Chandler Faizan Estel Tonelli, MD Gastroenterology and Hepatology [facility] Gastroenterology

## 2024-07-04 NOTE — Plan of Care (Signed)
  Problem: Education: Goal: Knowledge of General Education information will improve Description: Including pain rating scale, medication(s)/side effects and non-pharmacologic comfort measures Outcome: Progressing   Problem: Health Behavior/Discharge Planning: Goal: Ability to manage health-related needs will improve Outcome: Progressing   Problem: Clinical Measurements: Goal: Ability to maintain clinical measurements within normal limits will improve Outcome: Progressing Goal: Will remain free from infection Outcome: Progressing Goal: Diagnostic test results will improve Outcome: Progressing Goal: Respiratory complications will improve Outcome: Progressing Goal: Cardiovascular complication will be avoided Outcome: Progressing   Problem: Activity: Goal: Risk for activity intolerance will decrease Outcome: Progressing   Problem: Nutrition: Goal: Adequate nutrition will be maintained Outcome: Progressing   Problem: Coping: Goal: Level of anxiety will decrease Outcome: Progressing   Problem: Elimination: Goal: Will not experience complications related to bowel motility Outcome: Progressing Goal: Will not experience complications related to urinary retention Outcome: Progressing   Problem: Pain Managment: Goal: General experience of comfort will improve and/or be controlled Outcome: Progressing   Problem: Safety: Goal: Ability to remain free from injury will improve Outcome: Progressing   Problem: Skin Integrity: Goal: Risk for impaired skin integrity will decrease Outcome: Progressing   Problem: Education: Goal: Ability to describe self-care measures that may prevent or decrease complications (Diabetes Survival Skills Education) will improve Outcome: Progressing Goal: Individualized Educational Video(s) Outcome: Progressing   Problem: Coping: Goal: Ability to adjust to condition or change in health will improve Outcome: Progressing   Problem: Fluid  Volume: Goal: Ability to maintain a balanced intake and output will improve Outcome: Progressing   Problem: Health Behavior/Discharge Planning: Goal: Ability to identify and utilize available resources and services will improve Outcome: Progressing Goal: Ability to manage health-related needs will improve Outcome: Progressing   Problem: Metabolic: Goal: Ability to maintain appropriate glucose levels will improve Outcome: Progressing   Problem: Nutritional: Goal: Maintenance of adequate nutrition will improve Outcome: Progressing

## 2024-07-04 NOTE — Op Note (Signed)
 [facility] Patient Name: Dustin Chandler Procedure Date: 07/04/2024 3:21 PM MRN: [id number] Date of Birth: Oct 23, 1934 Attending MD: Deatrice Dine , MD, [id number] CSN: [id number] Age: 89 Admit Type: Inpatient Procedure:                Colonoscopy Indications:              Evaluation of unexplained GI bleeding presenting                            with Hematochezia, Acute post hemorrhagic anemia Providers:                Deatrice Dine, MD, Jon LABOR. Gerome RN, RN,                            Jon Loge Referring MD:              Medicines:                Monitored Anesthesia Care Complications:            No immediate complications. Estimated Blood Loss:     Estimated blood loss: none. Procedure:                Pre-Anesthesia Assessment:                           - Prior to the procedure, a History and Physical                            was performed, and patient medications and                            allergies were reviewed. The patient's tolerance of                            previous anesthesia was also reviewed. The risks                            and benefits of the procedure and the sedation                            options and risks were discussed with the patient.                            All questions were answered, and informed consent                            was obtained. Prior Anticoagulants: The patient has                            taken Eliquis  (apixaban ), last dose was 3 days                            prior to procedure. ASA Grade Assessment: III - A  patient with severe systemic disease. After                            reviewing the risks and benefits, the patient was                            deemed in satisfactory condition to undergo the                            procedure.                           After obtaining informed consent, the colonoscope                            was passed under direct vision.  Throughout the                            procedure, the patient's blood pressure, pulse, and                            oxygen saturations were monitored continuously. The                            [id number]) scope was introduced through the                            anus and advanced to the the cecum, identified by                            appendiceal orifice and ileocecal valve. The                            colonoscopy was performed without difficulty. The                            patient tolerated the procedure well. The quality                            of the bowel preparation was fair. The ileocecal                            valve, appendiceal orifice, and rectum were                            photographed. Scope In: 4:13:27 PM Scope Out: 4:27:46 PM Scope Withdrawal Time: 0 hours 9 minutes 22 seconds  Total Procedure Duration: 0 hours 14 minutes 19 seconds  Findings:      The perianal and digital rectal examinations were normal.      There is no endoscopic evidence of bleeding or mass in the entire colon.      Scattered small-mouthed diverticula were found in the entire colon.      A large amount of stool was found in the rectum, precluding       visualization. Lavage  of the area was performed using a large amount of       sterile water, resulting in incomplete clearance with fair visualization. Impression:               - Preparation of the colon was fair.                           - Diverticulosis in the entire examined colon.                           - Stool in the rectum, with inadequate exam of the                            rectum.                           - No specimens collected.                           -No evidence of old or active bleeding throughout                            the examined colon ; brown stools Moderate Sedation:      Per Anesthesia Care Recommendation:           Surgery and oncology evalutaion for management of                             distal stomach malignancy Procedure Code(s):        --- Professional ---                           [phone], Colonoscopy, flexible; diagnostic, including                            collection of specimen(s) by brushing or washing,                            when performed (separate procedure) Diagnosis Code(s):        --- Professional ---                           K92.1, Melena (includes Hematochezia)                           D62, Acute posthemorrhagic anemia CPT copyright 2022 American Medical Association. All rights reserved. The codes documented in this report are preliminary and upon coder review may  be revised to meet current compliance requirements. Deatrice Dine, MD Deatrice Dine, MD 07/04/2024 4:38:35 PM This report has been signed electronically. Number of Addenda: 0

## 2024-07-04 NOTE — Anesthesia Preprocedure Evaluation (Addendum)
 Anesthesia Evaluation  Patient identified by MRN, date of birth, ID band Patient awake    Reviewed: Allergy & Precautions, H&P , NPO status , Patient's Chart, lab work & pertinent test results, reviewed documented beta blocker date and time   Airway Mallampati: II  TM Distance: >3 FB Neck ROM: full    Dental  (+) Dental Advisory Given, Partial Lower, Partial Upper, Poor Dentition Very few teeth left:   Pulmonary former smoker, PE   Pulmonary exam normal breath sounds clear to auscultation       Cardiovascular hypertension, Normal cardiovascular exam Rhythm:regular Rate:Normal     Neuro/Psych  PSYCHIATRIC DISORDERS Anxiety     CVA    GI/Hepatic Neg liver ROS,,,GI bleeding   Endo/Other  diabetes, Type 2    Renal/GU negative Renal ROS  negative genitourinary   Musculoskeletal   Abdominal   Peds  Hematology  (+) Blood dyscrasia, anemia Hgb 8.1   Anesthesia Other Findings   Reproductive/Obstetrics negative OB ROS                              Anesthesia Physical Anesthesia Plan  ASA: 3  Anesthesia Plan: General   Post-op Pain Management: Minimal or no pain anticipated   Induction: Intravenous  PONV Risk Score and Plan: Propofol  infusion  Airway Management Planned: Nasal Cannula and Natural Airway  Additional Equipment: None  Intra-op Plan:   Post-operative Plan:   Informed Consent: I have reviewed the patients History and Physical, chart, labs and discussed the procedure including the risks, benefits and alternatives for the proposed anesthesia with the patient or authorized representative who has indicated his/her understanding and acceptance.     Dental Advisory Given  Plan Discussed with: CRNA  Anesthesia Plan Comments:          Anesthesia Quick Evaluation

## 2024-07-05 ENCOUNTER — Encounter (HOSPITAL_COMMUNITY): Payer: Self-pay | Admitting: Gastroenterology

## 2024-07-05 DIAGNOSIS — K3182 Dieulafoy lesion (hemorrhagic) of stomach and duodenum: Secondary | ICD-10-CM

## 2024-07-05 DIAGNOSIS — D649 Anemia, unspecified: Secondary | ICD-10-CM | POA: Diagnosis not present

## 2024-07-05 DIAGNOSIS — Z7901 Long term (current) use of anticoagulants: Secondary | ICD-10-CM | POA: Diagnosis not present

## 2024-07-05 DIAGNOSIS — K3189 Other diseases of stomach and duodenum: Secondary | ICD-10-CM | POA: Diagnosis not present

## 2024-07-05 DIAGNOSIS — K921 Melena: Secondary | ICD-10-CM | POA: Diagnosis not present

## 2024-07-05 LAB — BASIC METABOLIC PANEL WITH GFR
Anion gap: 5 (ref 5–15)
BUN: 8 mg/dL (ref 8–23)
CO2: 24 mmol/L (ref 22–32)
Calcium: 9 mg/dL (ref 8.9–10.3)
Chloride: 109 mmol/L (ref 98–111)
Creatinine, Ser: 0.63 mg/dL (ref 0.61–1.24)
GFR, Estimated: >60 mL/min (ref >60)
Glucose, Bld: 109 mg/dL — ABNORMAL HIGH (ref 70–99)
Potassium: 3.4 mmol/L — ABNORMAL LOW (ref 3.5–5.1)
Sodium: 138 mmol/L (ref 135–145)

## 2024-07-05 LAB — CBC
HCT: 27.7 % — ABNORMAL LOW (ref 39.0–52.0)
Hemoglobin: 8.6 g/dL — ABNORMAL LOW (ref 13.0–17.0)
MCH: 26.4 pg (ref 26.0–34.0)
MCHC: 31 g/dL (ref 30.0–36.0)
MCV: 85 fL (ref 80.0–100.0)
Platelets: 233 K/uL (ref 150–400)
RBC: 3.26 MIL/uL — ABNORMAL LOW (ref 4.22–5.81)
RDW: 16.7 % — ABNORMAL HIGH (ref 11.5–15.5)
WBC: 4.1 K/uL (ref 4.0–10.5)
nRBC: 0 % (ref 0.0–0.2)

## 2024-07-05 LAB — GLUCOSE, CAPILLARY
Glucose-Capillary: 112 mg/dL — ABNORMAL HIGH (ref 70–99)
Glucose-Capillary: 127 mg/dL — ABNORMAL HIGH (ref 70–99)
Glucose-Capillary: 187 mg/dL — ABNORMAL HIGH (ref 70–99)

## 2024-07-05 MED ORDER — POTASSIUM CHLORIDE 20 MEQ PO PACK
40.0000 meq | PACK | Freq: Once | ORAL | Status: AC
  Administered 2024-07-05: 40 meq via ORAL
  Filled 2024-07-05: qty 2

## 2024-07-05 MED ORDER — ACETAMINOPHEN 325 MG PO TABS: 650.0000 mg | ORAL_TABLET | Freq: Four times a day (QID) | ORAL | Status: DC | PRN

## 2024-07-05 MED ORDER — BISACODYL 5 MG PO TBEC: 5.0000 mg | DELAYED_RELEASE_TABLET | Freq: Every day | ORAL | Status: DC | PRN

## 2024-07-05 NOTE — Progress Notes (Signed)
 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  GI team will follow up on pathology results as it becomes available.   Given mass appears to be malignant in nature, consultation with oncology and surgery needed.  Dr. Cinderella discussed these results and recommendations with the patient and his wife.  Monitor H/H and transfuse as needed.   GI will sign off at this time.  Can follow-up outpatient as needed for surveillance per recommendations by oncology.

## 2024-07-05 NOTE — Progress Notes (Signed)
   07/05/24 1022  Spiritual Encounters  Type of Visit Initial  Care provided to: Patient;Family  Referral source Chaplain team  Reason for visit Routine spiritual support  OnCall Visit No  Interventions  Spiritual Care Interventions Made Established relationship of care and support;Compassionate presence;Narrative/life review;Encouragement   Chaplain received referral for lead chaplain regarding visiting Pt.  Upon arriving in the room, Pt was half asleep in the bed and I was greeted by his daughter.  We spoke briefly until the Pt awoke.  Pt conveys a peace and confidence that however his situation turns out, the Jacquetta has it in his hands.  Pt was sharing about his last experience with cancer approx, 6 years ago and how God brought him through it.  At this point the surgical Doctor arrive to speak with Pt.  Chaplain excused himself with a promise to return later.  Maude Roll, MDiv  Chaplain, [facility] [email or web address] [phone]

## 2024-07-05 NOTE — Consult Note (Signed)
 [facility] Consult  Reason for Consult: Concern for gastric cancer Referring Physician: Dr. Vicci   Chief Complaint   Shortness of Breath; Fatigue     HPI: Dustin Chandler is a 89 y.o. male who was admitted to the hospital with weakness, shortness of breath, and anemia.  He has had progressive worsening weakness over the last several months and has been put on iron supplementation secondary to his anemia.  He was noted to have guaiac positive stools.  Upon admission to the hospital, his hemoglobin was 7.1, so GI was consulted for EGD and colonoscopy.  He underwent EGD and colonoscopy with Dr. Cinderella yesterday.  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  Colonoscopy demonstrated pancolonic diverticulosis, stool within the rectum which limited exam, and no evidence of active or old bleeding in the colon.  General surgery and oncology were consulted regarding the EGD findings.  He has a past medical history significant for diabetes, hypertension, CVA, PE on Eliquis .  His surgical history is significant for robotic prostatectomy.  His Eliquis  has been held since being in the hospital.  He has not undergone any cross-sectional imaging since admission.  Past Medical History:  Diagnosis Date   Colon polyps 2011   Non cancerous    CVA (cerebral vascular accident) (HCC) 10/01/2021   Diabetes mellitus    Diverticulosis    Elevated PSA 2010   dr. 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    Past Surgical History:  Procedure Laterality Date   CYSTOSTOMY W/ BLADDER BIOPSY     removal of benign mole     15 years ago    Family History  Problem Relation Age of Onset   Heart failure Mother    Pneumonia Father    Coronary artery disease  Brother        bowel obstruction   Pneumonia Sister    Stroke Sister    Aneurysm Sister     Social History   Tobacco Use   Smoking status: Former    Types: Cigarettes   Smokeless tobacco: Never  Vaping Use   Vaping status: Never Used  Substance Use Topics   Alcohol use: No   Drug use: No    Medications: I have reviewed the patient's current medications.  Allergies  Allergen Reactions   Lisinopril Anaphylaxis and Swelling    Angioedema of eyelids and lips   Amlodipine  Other (See Comments)    Edema of lower extremity, Swollen ankle region   Fish Allergy Itching    Bone-in fish   Peanut-Containing Drug Products Itching   Penicillin G Other (See Comments)    Joint pain   Penicillins Other (See Comments)    Locked my knee up   Tomato Itching    Raw tomatoes     ROS:  Pertinent items are noted in HPI.  Blood pressure (!) 160/77, pulse 64, temperature 99.1 F (37.3 C), temperature source Oral, resp. rate 18, height 6' (1.829 m), weight 72.4 kg, SpO2 97%. Physical Exam Vitals reviewed.  Constitutional:      Appearance: He is well-developed.  HENT:     Head: Normocephalic and atraumatic.  Eyes:     Extraocular Movements: Extraocular movements intact.     Pupils: Pupils are equal,  round, and reactive to light.  Cardiovascular:     Rate and Rhythm: Normal rate.  Pulmonary:     Effort: Pulmonary effort is normal.  Abdominal:     Comments: Abdomen soft, nondistended, no percussion tenderness, nontender to palpation; no rigidity, guarding, rebound tenderness  Musculoskeletal:        General: Normal range of motion.     Cervical back: Normal range of motion.  Skin:    General: Skin is warm and dry.  Neurological:     General: No focal deficit present.     Mental Status: He is alert.  Psychiatric:        Mood and Affect: Mood normal.        Behavior: Behavior normal.     Results: Results for orders placed or performed during the hospital encounter of  06/30/24 (from the past 48 hours)  Glucose, capillary     Status: Abnormal   Collection Time: 07/03/24 11:35 AM  Result Value Ref Range   Glucose-Capillary 109 (H) 70 - 99 mg/dL    Comment: Glucose reference range applies only to samples taken after fasting for at least 8 hours.  Glucose, capillary     Status: Abnormal   Collection Time: 07/03/24  5:43 PM  Result Value Ref Range   Glucose-Capillary 137 (H) 70 - 99 mg/dL    Comment: Glucose reference range applies only to samples taken after fasting for at least 8 hours.   Comment 1 Notify RN    Comment 2 Document in Chart   Glucose, capillary     Status: Abnormal   Collection Time: 07/03/24 11:52 PM  Result Value Ref Range   Glucose-Capillary 104 (H) 70 - 99 mg/dL    Comment: Glucose reference range applies only to samples taken after fasting for at least 8 hours.  CBC     Status: Abnormal   Collection Time: 07/04/24  5:00 AM  Result Value Ref Range   WBC 6.3 4.0 - 10.5 K/uL   RBC 3.14 (L) 4.22 - 5.81 MIL/uL   Hemoglobin 8.1 (L) 13.0 - 17.0 g/dL   HCT 73.0 (L) 60.9 - 47.9 %   MCV 85.7 80.0 - 100.0 fL   MCH 25.8 (L) 26.0 - 34.0 pg   MCHC 30.1 30.0 - 36.0 g/dL   RDW 83.0 (H) 88.4 - 84.4 %   Platelets 221 150 - 400 K/uL   nRBC 0.0 0.0 - 0.2 %    Comment: Performed at [facility], [address], [place], [place] [address]  Basic metabolic panel with GFR     Status: Abnormal   Collection Time: 07/04/24  5:00 AM  Result Value Ref Range   Sodium 138 135 - 145 mmol/L   Potassium 3.5 3.5 - 5.1 mmol/L   Chloride 108 98 - 111 mmol/L   CO2 22 22 - 32 mmol/L   Glucose, Bld 87 70 - 99 mg/dL    Comment: Glucose reference range applies only to samples taken after fasting for at least 8 hours.   BUN 7 (L) 8 - 23 mg/dL   Creatinine, Ser 9.36 0.61 - 1.24 mg/dL   Calcium  8.6 (L) 8.9 - 10.3 mg/dL   GFR, Estimated >39 >39 mL/min    Comment: (NOTE) Calculated using the CKD-EPI Creatinine Equation (2021)    Anion gap 8 5 - 15    Comment:  Performed at [facility], [address], [place], [place] [address]  Glucose, capillary     Status: None  Collection Time: 07/04/24  5:05 AM  Result Value Ref Range   Glucose-Capillary 97 70 - 99 mg/dL    Comment: Glucose reference range applies only to samples taken after fasting for at least 8 hours.  Glucose, capillary     Status: Abnormal   Collection Time: 07/04/24 11:50 AM  Result Value Ref Range   Glucose-Capillary 103 (H) 70 - 99 mg/dL    Comment: Glucose reference range applies only to samples taken after fasting for at least 8 hours.   Comment 1 Notify RN    Comment 2 Document in Chart   Glucose, capillary     Status: None   Collection Time: 07/04/24  1:45 PM  Result Value Ref Range   Glucose-Capillary 87 70 - 99 mg/dL    Comment: Glucose reference range applies only to samples taken after fasting for at least 8 hours.  Glucose, capillary     Status: None   Collection Time: 07/04/24  4:34 PM  Result Value Ref Range   Glucose-Capillary 91 70 - 99 mg/dL    Comment: Glucose reference range applies only to samples taken after fasting for at least 8 hours.  Glucose, capillary     Status: None   Collection Time: 07/04/24  6:05 PM  Result Value Ref Range   Glucose-Capillary 93 70 - 99 mg/dL    Comment: Glucose reference range applies only to samples taken after fasting for at least 8 hours.   Comment 1 Notify RN    Comment 2 Document in Chart   Glucose, capillary     Status: Abnormal   Collection Time: 07/05/24 12:17 AM  Result Value Ref Range   Glucose-Capillary 127 (H) 70 - 99 mg/dL    Comment: Glucose reference range applies only to samples taken after fasting for at least 8 hours.  CBC     Status: Abnormal   Collection Time: 07/05/24  4:31 AM  Result Value Ref Range   WBC 4.1 4.0 - 10.5 K/uL   RBC 3.26 (L) 4.22 - 5.81 MIL/uL   Hemoglobin 8.6 (L) 13.0 - 17.0 g/dL   HCT 72.2 (L) 60.9 - 47.9 %   MCV 85.0 80.0 - 100.0 fL   MCH 26.4 26.0 - 34.0 pg   MCHC 31.0 30.0 -  36.0 g/dL   RDW 83.2 (H) 88.4 - 84.4 %   Platelets 233 150 - 400 K/uL   nRBC 0.0 0.0 - 0.2 %    Comment: Performed at [facility], [address] [place], [place] [address]  Basic metabolic panel with GFR     Status: Abnormal   Collection Time: 07/05/24  4:31 AM  Result Value Ref Range   Sodium 138 135 - 145 mmol/L   Potassium 3.4 (L) 3.5 - 5.1 mmol/L   Chloride 109 98 - 111 mmol/L   CO2 24 22 - 32 mmol/L   Glucose, Bld 109 (H) 70 - 99 mg/dL    Comment: Glucose reference range applies only to samples taken after fasting for at least 8 hours.   BUN 8 8 - 23 mg/dL   Creatinine, Ser 9.36 0.61 - 1.24 mg/dL   Calcium  9.0 8.9 - 10.3 mg/dL   GFR, Estimated >39 >39 mL/min    Comment: (NOTE) Calculated using the CKD-EPI Creatinine Equation (2021)    Anion gap 5 5 - 15    Comment: Performed at [facility], [address] [place], [place] [address]  Glucose, capillary     Status: Abnormal  Collection Time: 07/05/24  6:05 AM  Result Value Ref Range   Glucose-Capillary 112 (H) 70 - 99 mg/dL    Comment: Glucose reference range applies only to samples taken after fasting for at least 8 hours.    No results found.   Assessment & Plan:  Dustin Chandler is a 89 y.o. male who was admitted with symptomatic anemia and shortness of breath.  He was noted to have a gastric mass, concerning for cancer.  General surgery and oncology were consulted regarding EGD findings.  -I explained to the patient and his family that the EGD demonstrated a mass within his stomach, which is concerning for cancer.  We will need to await the pathology results.  I also explained that prior to any interventions for this likely gastric cancer, he will need to undergo full workup with staging.  We discussed that his workup and staging imaging will be able to be performed as an outpatient. -Once his workup has been completed, we discussed that he will follow-up with oncology to determine next steps in treatment.   Depending on the findings on imaging, he may require neoadjuvant therapy prior to any surgical interventions -After meeting with oncology, he will follow-up with a surgeon to discuss his surgical options.  We did discuss that any surgical intervention for a gastric cancer will be a larger surgery, and goals of care will need to be addressed prior to that surgery given his age -If patient is deemed to be an appropriate surgical candidate after workup, he can follow-up with either my partner Dr. Mavis or [facility] surgery to discuss surgical options -Patient stable for discharge once tolerating a diet -Appreciate oncology recommendations -Care per hospitalist  All questions were answered to the satisfaction of the patient and family.  Note: Portions of this report may have been transcribed using voice recognition software. Every effort has been made to ensure accuracy; however, inadvertent computerized transcription errors may still be present.   -- Dorothyann Brittle, DO [facility] [address] [place], [place] [address] [phone] (office)

## 2024-07-05 NOTE — Discharge Summary (Signed)
 Physician Discharge Summary  Dustin Chandler FMW:[id number] DOB: Dec 03, 1934 DOA: 06/30/2024  [facility]  Admit date: 06/30/2024 Discharge date: 07/05/2024  Disposition: HOME (HH DECLINED)  Recommendations for Outpatient Follow-up:  Follow up with PCP at [facility] to refer patient to [facility]  (this has to be done before [facility] can make him an appointment)  Please obtain BMP/CBC in one week Please follow up on the following pending results: BIOPSY RESULTS  Please obtain outpatient palliative medicine consultation for goals of care discussion  Home Health: declined   Discharge Condition: STABLE but Guarded   CODE STATUS: FULL   DIET: soft foods recommended   Brief Hospitalization Summary: Please see all hospital notes, images, labs for full details of the hospitalization. Admission provider HPI:   89 y.o. male with medical history significant of history of CVA, diabetes, hypertension, history of PE on Eliquis , history of CVA.  Patient seen due to progressive weakness over the last several months.  He got to the point where he got short of breath just getting dressed and had to stop in the middle.  He denies fevers, chills, cough, chest pain.  He did see his PCP who placed him on iron due to the anemia, which he has been taking.  Here, he was found to be anemic with guaiac positive stool.  Plan for EGD/ colonoscopy 7/15 but had to be delayed due to poor prep.   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   -- GI team recommending surgery and oncology evaluation -- I spoke with oncologist Dr. Davonna, she recommended outpatient follow up with [facility] due to biopsy results not being back.  Family wants to go thru [place].  I was informed by [facility] that [facility]  patient to [facility] before they can schedule an appointment.  I informed wife, she verbalized understanding and said she would make sure it is done as they understand how to navigate the [place] system.  -- pt tolerating soft food diet which will continue -- family will go thru [place] to arrange for surgical consultation -- I also recommend goals of care discussion with outpatient palliative care consultation, no palliative care service is available at this facility for next 3 days.    History of PE -- apixaban  on hold due to gastric tumor and bleeding dieulafoy lesion and high risk for bleeding    Type 2 DM  -- he was on SSI coverage but insulin  not required -- did not resume metformin given poor oral intake CBG (last 3)  Recent Labs (last 2 labs)       Recent Labs    07/04/24 1150 07/04/24 1345 07/04/24 1634  GLUCAP 103* 87 91      Essential HTN -- follow up with PCP     Discharge Diagnoses:  Principal Problem:   Symptomatic anemia Active Problems:   Essential hypertension   DM (diabetes mellitus) (HCC)   Dyslipidemia   Stroke (cerebrum) (HCC)   History of pulmonary embolus (PE)   GI bleed   Chronic anticoagulation   Gastric mass   Dieulafoy lesion of stomach   Diverticulosis of colon without hemorrhage   Discharge Instructions: Discharge Instructions     Ambulatory referral to Hematology / Oncology   Complete by: As  directed       Allergies as of 07/05/2024       Reactions   Lisinopril Anaphylaxis, Swelling   Angioedema of eyelids and lips   Amlodipine  Other (See Comments)   Edema of lower extremity, Swollen ankle region   Fish Allergy Itching   Bone-in fish   Peanut-containing Drug Products Itching   Penicillin G Other (See Comments)   Joint pain   Penicillins Other (See Comments)   Locked my knee up   Tomato Itching   Raw tomatoes        Medication List     STOP taking these medications    apixaban  5 MG Tabs tablet Commonly known as:  ELIQUIS    metFORMIN 500 MG 24 hr tablet Commonly known as: GLUCOPHAGE-XR       TAKE these medications    acetaminophen  325 MG tablet Commonly known as: TYLENOL  Take 2 tablets (650 mg total) by mouth every 6 (six) hours as needed for mild pain (pain score 1-3) or fever (or Fever >/= 101).   atorvastatin  40 MG tablet Commonly known as: LIPITOR Take 1 tablet (40 mg total) by mouth every evening.   bisacodyl  5 MG EC tablet Commonly known as: DULCOLAX Take 1 tablet (5 mg total) by mouth daily as needed for moderate constipation.   cetirizine 10 MG tablet Commonly known as: ZYRTEC Take 10 mg by mouth daily as needed for allergies.   cloNIDine  0.1 MG tablet Commonly known as: CATAPRES  Take 0.1 mg by mouth daily.   cyanocobalamin 1000 MCG tablet Commonly known as: VITAMIN B12 Take 1 tablet (1,000 mcg total) by mouth daily.   diphenhydrAMINE  25 MG tablet Commonly known as: BENADRYL  Take 25 mg by mouth at bedtime.   guaiFENesin  600 MG 12 hr tablet Commonly known as: Mucinex  Take 1 tablet (600 mg total) by mouth 2 (two) times daily as needed. What changed: reasons to take this   hydrALAZINE  25 MG tablet Commonly known as: APRESOLINE  Take 1 tablet (25 mg total) by mouth in the morning and at bedtime.   thiamine  100 MG tablet Commonly known as: Vitamin B-1 Take 100 mg by mouth daily.   Vitamin D3 Liqd Take 1 drop by mouth daily.        Follow-up Information     [facility] Schedule an appointment as soon as possible for a visit in 1 week(s).   Why: Hospital Follow Up--PLEASE GET REFERRAL TO ONCOLOGIST AND SURGERY               Allergies  Allergen Reactions   Lisinopril Anaphylaxis and Swelling    Angioedema of eyelids and lips   Amlodipine  Other (See Comments)    Edema of lower extremity, Swollen ankle region   Fish Allergy Itching    Bone-in fish   Peanut-Containing Drug Products Itching   Penicillin G Other (See Comments)    Joint  pain   Penicillins Other (See Comments)    Locked my knee up   Tomato Itching    Raw tomatoes   Allergies as of 07/05/2024       Reactions   Lisinopril Anaphylaxis, Swelling   Angioedema of eyelids and lips   Amlodipine  Other (See Comments)   Edema of lower extremity, Swollen ankle region   Fish Allergy Itching   Bone-in fish   Peanut-containing Drug Products Itching   Penicillin G Other (See Comments)   Joint pain   Penicillins Other (See Comments)   Locked my knee up  Tomato Itching   Raw tomatoes        Medication List     STOP taking these medications    apixaban  5 MG Tabs tablet Commonly known as: ELIQUIS    metFORMIN 500 MG 24 hr tablet Commonly known as: GLUCOPHAGE-XR       TAKE these medications    acetaminophen  325 MG tablet Commonly known as: TYLENOL  Take 2 tablets (650 mg total) by mouth every 6 (six) hours as needed for mild pain (pain score 1-3) or fever (or Fever >/= 101).   atorvastatin  40 MG tablet Commonly known as: LIPITOR Take 1 tablet (40 mg total) by mouth every evening.   bisacodyl  5 MG EC tablet Commonly known as: DULCOLAX Take 1 tablet (5 mg total) by mouth daily as needed for moderate constipation.   cetirizine 10 MG tablet Commonly known as: ZYRTEC Take 10 mg by mouth daily as needed for allergies.   cloNIDine  0.1 MG tablet Commonly known as: CATAPRES  Take 0.1 mg by mouth daily.   cyanocobalamin 1000 MCG tablet Commonly known as: VITAMIN B12 Take 1 tablet (1,000 mcg total) by mouth daily.   diphenhydrAMINE  25 MG tablet Commonly known as: BENADRYL  Take 25 mg by mouth at bedtime.   guaiFENesin  600 MG 12 hr tablet Commonly known as: Mucinex  Take 1 tablet (600 mg total) by mouth 2 (two) times daily as needed. What changed: reasons to take this   hydrALAZINE  25 MG tablet Commonly known as: APRESOLINE  Take 1 tablet (25 mg total) by mouth in the morning and at bedtime.   thiamine  100 MG tablet Commonly known as:  Vitamin B-1 Take 100 mg by mouth daily.   Vitamin D3 Liqd Take 1 drop by mouth daily.        Procedures/Studies: DG Chest 2 View Result Date: 06/30/2024 CLINICAL DATA:  SOB EXAM: CHEST - 2 VIEW COMPARISON:  May 05, 2024 FINDINGS: Limited assessment on lateral radiograph secondary to positioning. The cardiomediastinal silhouette is unchanged in contour. No pleural effusion. No pneumothorax. No acute pleuroparenchymal abnormality. Similar gaseous distension of bowel in the upper abdomen. Atherosclerotic calcifications. Multilevel degenerative changes of the thoracic spine. IMPRESSION: No acute cardiopulmonary abnormality. Electronically Signed   By: Corean Salter M.D.   On: 06/30/2024 15:23     Subjective: Pt has been eating and drinking without difficulty.  No specific complaints.   Discharge Exam: Vitals:   07/04/24 2056 07/05/24 0348  BP: (!) 157/74 (!) 160/77  Pulse: 62 64  Resp: 18 18  Temp: 98.7 F (37.1 C) 99.1 F (37.3 C)  SpO2: 98% 97%   Vitals:   07/04/24 1645 07/04/24 1708 07/04/24 2056 07/05/24 0348  BP: 118/74 (!) 169/88 (!) 157/74 (!) 160/77  Pulse: 70 (!) 58 62 64  Resp: (!) 24  18 18   Temp: 97.6 F (36.4 C) (!) 97.5 F (36.4 C) 98.7 F (37.1 C) 99.1 F (37.3 C)  TempSrc:  Oral Oral Oral  SpO2: 100% 100% 98% 97%  Weight:      Height:       General exam: Appears calm and comfortable pt appears chronically ill.  Respiratory system: Clear to auscultation. Respiratory effort normal. Cardiovascular system: normal S1 & S2 heard. No JVD, murmurs, rubs, gallops or clicks. No pedal edema. Gastrointestinal system: Abdomen is nondistended, soft and nontender. No organomegaly or masses felt. Normal bowel sounds heard. Central nervous system: Alert and oriented. No focal neurological deficits. Extremities: Symmetric 5 x 5 power. Skin: No rashes, lesions or ulcers. Psychiatry:  Judgement and insight appear diminished. Mood & affect appropriate.   The results of  significant diagnostics from this hospitalization (including imaging, microbiology, ancillary and laboratory) are listed below for reference.     Microbiology: No results found for this or any previous visit (from the past 240 hours).   Labs: BNP (last 3 results) Recent Labs    06/30/24 1449  BNP 73.0   Basic Metabolic Panel: Recent Labs  Lab 06/30/24 1449 07/01/24 0442 07/02/24 0440 07/04/24 0500 07/05/24 0431  NA 138 138 141 138 138  K 3.4* 3.5 3.7 3.5 3.4*  CL 105 107 110 108 109  CO2 23 23 23 22 24   GLUCOSE 145* 91 76 87 109*  BUN 16 15 12  7* 8  CREATININE 0.72 0.68 0.69 0.63 0.63  CALCIUM  9.2 8.7* 9.3 8.6* 9.0   Liver Function Tests: Recent Labs  Lab 06/30/24 1449  AST 20  ALT 11  ALKPHOS 47  BILITOT 0.5  PROT 6.2*  ALBUMIN 2.9*   No results for input(s): LIPASE, AMYLASE in the last 168 hours. No results for input(s): AMMONIA in the last 168 hours. CBC: Recent Labs  Lab 06/30/24 1445 07/01/24 0442 07/01/24 1528 07/02/24 0440 07/04/24 0500 07/05/24 0431  WBC 5.3 5.4  --  8.3 6.3 4.1  HGB 7.1* 7.9* 9.0* 9.4* 8.1* 8.6*  HCT 23.6* 24.5* 28.6* 31.3* 26.9* 27.7*  MCV 88.1 83.3  --  86.0 85.7 85.0  PLT 262 228  --  263 221 233   Cardiac Enzymes: No results for input(s): CKTOTAL, CKMB, CKMBINDEX, TROPONINI in the last 168 hours. BNP: Invalid input(s): POCBNP CBG: Recent Labs  Lab 07/04/24 1634 07/04/24 1805 07/05/24 0017 07/05/24 0605 07/05/24 1150  GLUCAP 91 93 127* 112* 187*   D-Dimer No results for input(s): DDIMER in the last 72 hours. Hgb A1c No results for input(s): HGBA1C in the last 72 hours. Lipid Profile No results for input(s): CHOL, HDL, LDLCALC, TRIG, CHOLHDL, LDLDIRECT in the last 72 hours. Thyroid function studies No results for input(s): TSH, T4TOTAL, T3FREE, THYROIDAB in the last 72 hours.  Invalid input(s): FREET3 Anemia work up No results for input(s): VITAMINB12, FOLATE,  FERRITIN, TIBC, IRON, RETICCTPCT in the last 72 hours. Urinalysis    Component Value Date/Time   COLORURINE STRAW (A) 10/01/2021 1451   APPEARANCEUR CLEAR 10/01/2021 1451   LABSPEC 1.006 10/01/2021 1451   PHURINE 7.0 10/01/2021 1451   GLUCOSEU NEGATIVE 10/01/2021 1451   HGBUR NEGATIVE 10/01/2021 1451   BILIRUBINUR NEGATIVE 10/01/2021 1451   KETONESUR NEGATIVE 10/01/2021 1451   PROTEINUR NEGATIVE 10/01/2021 1451   NITRITE NEGATIVE 10/01/2021 1451   LEUKOCYTESUR NEGATIVE 10/01/2021 1451   Sepsis Labs Recent Labs  Lab 07/01/24 0442 07/02/24 0440 07/04/24 0500 07/05/24 0431  WBC 5.4 8.3 6.3 4.1   Microbiology No results found for this or any previous visit (from the past 240 hours).  Time coordinating discharge: 47 mins   SIGNED:  Afton Louder, MD  [facility] 07/05/2024, 2:00 PM How to contact the [facility] Attending or Consulting provider 7A - 7P or covering provider during after hours 7P -7A, for this patient?  Check the care team in [facility] and look for a) attending/consulting [facility] provider listed and b) the [facility] team listed Log into [email or web address] and use [place]'s universal password to access. If you do not have the password, please contact the hospital operator. Locate the [facility] provider you are looking for under [facility] and page to a number that you can be  directly reached. If you still have difficulty reaching the provider, please page the [facility] (Director on Call) for the Hospitalists listed on amion for assistance.

## 2024-07-05 NOTE — Progress Notes (Signed)
 Mobility Specialist Progress Note:    07/05/24 1315  Mobility  Activity Ambulated with assistance in hallway  Level of Assistance Minimal assist, patient does 75% or more  Assistive Device Front wheel walker  Distance Ambulated (ft) 280 ft  Range of Motion/Exercises Active;All extremities  Activity Response Tolerated well  Mobility Referral Yes  Mobility visit 1 Mobility  Mobility Specialist Start Time (ACUTE ONLY) 1315  Mobility Specialist Stop Time (ACUTE ONLY) 1335  Mobility Specialist Time Calculation (min) (ACUTE ONLY) 20 min   Pt received in bed, daughter in room. Agreeable to mobility, required MinA to stand and CGA to ambulate with RW. Tolerated well, asx throughout. Returned pt supine, all needs met.  Shira Bobst Mobility Specialist Please contact via Special educational needs teacher or  Rehab office at [phone]

## 2024-07-05 NOTE — Plan of Care (Signed)

## 2024-07-05 NOTE — Plan of Care (Signed)
  Problem: Education: Goal: Knowledge of General Education information will improve Description: Including pain rating scale, medication(s)/side effects and non-pharmacologic comfort measures Outcome: Progressing   Problem: Health Behavior/Discharge Planning: Goal: Ability to manage health-related needs will improve Outcome: Progressing   Problem: Clinical Measurements: Goal: Ability to maintain clinical measurements within normal limits will improve Outcome: Progressing Goal: Will remain free from infection Outcome: Progressing Goal: Diagnostic test results will improve Outcome: Progressing Goal: Respiratory complications will improve Outcome: Progressing Goal: Cardiovascular complication will be avoided Outcome: Progressing   Problem: Activity: Goal: Risk for activity intolerance will decrease Outcome: Progressing   Problem: Nutrition: Goal: Adequate nutrition will be maintained Outcome: Progressing   Problem: Coping: Goal: Level of anxiety will decrease Outcome: Progressing   Problem: Elimination: Goal: Will not experience complications related to bowel motility Outcome: Progressing Goal: Will not experience complications related to urinary retention Outcome: Progressing   Problem: Pain Managment: Goal: General experience of comfort will improve and/or be controlled Outcome: Progressing   Problem: Safety: Goal: Ability to remain free from injury will improve Outcome: Progressing   Problem: Skin Integrity: Goal: Risk for impaired skin integrity will decrease Outcome: Progressing   Problem: Education: Goal: Ability to describe self-care measures that may prevent or decrease complications (Diabetes Survival Skills Education) will improve Outcome: Progressing   Problem: Coping: Goal: Ability to adjust to condition or change in health will improve Outcome: Progressing   Problem: Fluid Volume: Goal: Ability to maintain a balanced intake and output will  improve Outcome: Progressing   Problem: Health Behavior/Discharge Planning: Goal: Ability to identify and utilize available resources and services will improve Outcome: Progressing Goal: Ability to manage health-related needs will improve Outcome: Progressing   Problem: Metabolic: Goal: Ability to maintain appropriate glucose levels will improve Outcome: Progressing   Problem: Nutritional: Goal: Maintenance of adequate nutrition will improve Outcome: Progressing Goal: Progress toward achieving an optimal weight will improve Outcome: Progressing   Problem: Skin Integrity: Goal: Risk for impaired skin integrity will decrease Outcome: Progressing

## 2024-07-08 LAB — SURGICAL PATHOLOGY

## 2024-07-09 ENCOUNTER — Telehealth (INDEPENDENT_AMBULATORY_CARE_PROVIDER_SITE_OTHER): Payer: Self-pay | Admitting: Gastroenterology

## 2024-07-09 NOTE — Telephone Encounter (Signed)
 Patient's daughter returned your call - her number [phone]

## 2024-07-10 NOTE — Telephone Encounter (Signed)
 Patient daughter Dustin Chandler is returning your call regarding this patient. [phone].

## 2024-07-10 NOTE — Telephone Encounter (Signed)
 Faxed to [facility]

## 2024-07-15 ENCOUNTER — Ambulatory Visit (INDEPENDENT_AMBULATORY_CARE_PROVIDER_SITE_OTHER): Payer: Self-pay | Admitting: Gastroenterology

## 2024-07-24 ENCOUNTER — Inpatient Hospital Stay

## 2024-07-24 ENCOUNTER — Inpatient Hospital Stay: Attending: Oncology | Admitting: Oncology

## 2024-07-24 VITALS — BP 115/71 | HR 95 | Temp 97.9°F | Resp 19 | Wt 152.8 lb

## 2024-07-24 DIAGNOSIS — D509 Iron deficiency anemia, unspecified: Secondary | ICD-10-CM | POA: Diagnosis present

## 2024-07-24 DIAGNOSIS — R634 Abnormal weight loss: Secondary | ICD-10-CM | POA: Diagnosis not present

## 2024-07-24 DIAGNOSIS — C169 Malignant neoplasm of stomach, unspecified: Secondary | ICD-10-CM

## 2024-07-24 DIAGNOSIS — D500 Iron deficiency anemia secondary to blood loss (chronic): Secondary | ICD-10-CM | POA: Diagnosis not present

## 2024-07-24 DIAGNOSIS — I8290 Acute embolism and thrombosis of unspecified vein: Secondary | ICD-10-CM

## 2024-07-24 DIAGNOSIS — Z7189 Other specified counseling: Secondary | ICD-10-CM

## 2024-07-24 NOTE — Progress Notes (Signed)
 Hematology-Oncology Clinic Note  System, Provider Not In   Reason for Referral: Gastric adenocarcinoma  Oncology History: I have reviewed his chart and materials related to his cancer extensively and collaborated history with the patient. Summary of oncologic history is as follows:  Oncology History  Gastric adenocarcinoma (HCC)  07/04/2024 Pathology Results   FINAL MICROSCOPIC DIAGNOSIS:   A. GASTRIC MASS, BIOPSY:  - Invasive moderate to poorly differentiated adenocarcinoma.    07/04/2024 Procedure   Endoscopy:  - Normal esophagus.  - Actively bleeding Dieulafoy lesion of stomach. 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 Biopsied x 8  - Normal duodenal bulb and second portion of the duodenum.   07/24/2024 Initial Diagnosis   Gastric adenocarcinoma (HCC)       History of Presenting Illness: Dustin Chandler 89 y.o. male is referred by Dr. Vicci for gastric adenocarcinoma.  Patient has a past medical history of CVA, diabetes, hypertension, PE on Eliquis .  He was accompanied by his daughter today.  Patient was admitted to the hospital on 06/30/2024 for progressive weakness and anemia.  He had endoscopy done at that time that showed a large fungating and ulcerated gastric mass which was biopsied.  EGD also showed a dieulafoy lesion of the stomach with placement of 3 hemostatic clips.  The pathology of the biopsy revealed adenocarcinoma.  Patient reported that he experienced significant weight loss approximately 60 pounds over the past 1 year and has decreased appetite.He consumes about two meals a day and is on a soft diet due to difficulty chewing with dentures. No current symptoms of diarrhea or constipation, but he notes black stools.  He was taken off of Eliquis  due to bleeding concerns.  He reports feeling better since his recent hospital discharge, with improved movement. However, he spends most of his day in a chair and has not been able to  mow the lawn this year. He can bathe, dress, and eat independently but no longer prepares his own meals.  He has a history of smoking, having quit 30 years ago, and also quit chewing tobacco around the same time. He does not consume alcohol.  He lives with his wife, daughter's family and son.  He is mostly independent with his chores.  No family history of cancer, although he mentions a brother who had cancer, but he is unsure of the type.   Medical History: Past Medical History:  Diagnosis Date   Colon polyps 2011   Non cancerous    CVA (cerebral vascular accident) (HCC) 10/01/2021   Diabetes mellitus    Diverticulosis    Elevated PSA 2010   dr. 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    Surgical history: Past Surgical History:  Procedure Laterality Date   COLONOSCOPY N/A 07/04/2024   Procedure: COLONOSCOPY;  Surgeon: Cinderella Deatrice FALCON, MD;  Location: [facility];  Service: Endoscopy;  Laterality: N/A;   CYSTOSTOMY W/ BLADDER BIOPSY     ESOPHAGOGASTRODUODENOSCOPY N/A 07/04/2024   Procedure: EGD (ESOPHAGOGASTRODUODENOSCOPY);  Surgeon: Cinderella Deatrice FALCON, MD;  Location: [facility];  Service: Endoscopy;  Laterality: N/A;   removal of benign mole     15 years ago     Allergies:  is allergic to lisinopril, amlodipine , fish allergy, peanut-containing drug products, penicillin g, penicillins, and tomato.  Medications:  Current Outpatient Medications  Medication Sig Dispense Refill  acetaminophen  (TYLENOL ) 325 MG tablet Take 2 tablets (650 mg total) by mouth every 6 (six) hours as needed for mild pain (pain score 1-3) or fever (or Fever >/= 101).     atorvastatin  (LIPITOR) 40 MG tablet Take 1 tablet (40 mg total) by mouth every evening. 30 tablet 3   bisacodyl  (DULCOLAX) 5 MG EC tablet Take 1 tablet (5 mg total) by mouth daily as needed for moderate constipation.     cetirizine   (ZYRTEC ) 10 MG tablet Take 10 mg by mouth daily as needed for allergies.     Cholecalciferol (VITAMIN D3) LIQD Take 1 drop by mouth daily.     cloNIDine  (CATAPRES ) 0.1 MG tablet Take 0.1 mg by mouth daily.     diphenhydrAMINE  (BENADRYL ) 25 MG tablet Take 25 mg by mouth at bedtime.     guaiFENesin  (MUCINEX ) 600 MG 12 hr tablet Take 1 tablet (600 mg total) by mouth 2 (two) times daily as needed. (Patient taking differently: Take 600 mg by mouth 2 (two) times daily as needed for cough or to loosen phlegm.) 20 tablet 0   hydrALAZINE  (APRESOLINE ) 25 MG tablet Take 1 tablet (25 mg total) by mouth in the morning and at bedtime. 60 tablet 11   thiamine  (VITAMIN B-1) 100 MG tablet Take 100 mg by mouth daily.     vitamin B-12 (CYANOCOBALAMIN) 1000 MCG tablet Take 1 tablet (1,000 mcg total) by mouth daily. 30 tablet 3   No current facility-administered medications for this visit.    Review of Systems: Constitutional: Denies fevers, chills or abnormal night sweats Eyes: Denies blurriness of vision, double vision or watery eyes Ears, nose, mouth, throat, and face: Denies mucositis or sore throat Respiratory: Denies cough, dyspnea or wheezes Cardiovascular: Denies palpitation, chest discomfort or lower extremity swelling Gastrointestinal:  Denies nausea, heartburn or change in bowel habits Skin: Denies abnormal skin rashes Lymphatics: Denies new lymphadenopathy or easy bruising Neurological:Denies numbness, tingling or new weaknesses Behavioral/Psych: Mood is stable, no new changes  All other systems were reviewed with the patient and are negative.  Physical Examination: ECOG PERFORMANCE STATUS: 2 - Symptomatic, <50% confined to bed  Vitals:   07/24/24 1102  BP: 115/71  Pulse: 95  Resp: 19  Temp: 97.9 F (36.6 C)  SpO2: 95%   Filed Weights   07/24/24 1102  Weight: 152 lb 12.5 oz (69.3 kg)    GENERAL:alert, no distress and comfortable SKIN: skin color, texture, turgor are normal, no  rashes or significant lesions EYES: normal, conjunctiva are pink and non-injected, sclera clear OROPHARYNX:no exudate, no erythema and lips, buccal mucosa, and tongue normal  NECK: supple, thyroid normal size, non-tender, without nodularity LYMPH:  no palpable lymphadenopathy in the cervical, axillary or inguinal LUNGS: clear to auscultation and percussion with normal breathing effort HEART: regular rate & rhythm and no murmurs and no lower extremity edema ABDOMEN:abdomen soft, non-tender and normal bowel sounds Musculoskeletal:no cyanosis of digits and no clubbing  PSYCH: alert & oriented x 3 with fluent speech NEURO: no focal motor/sensory deficits   Laboratory Data: I have reviewed the data as listed Lab Results  Component Value Date   WBC 4.1 07/05/2024   HGB 8.6 (L) 07/05/2024   HCT 27.7 (L) 07/05/2024   MCV 85.0 07/05/2024   PLT 233 07/05/2024   Recent Labs    06/30/24 1449 07/01/24 0442 07/02/24 0440 07/04/24 0500 07/05/24 0431  NA 138   < > 141 138 138  K 3.4*   < > 3.7 3.5  3.4*  CL 105   < > 110 108 109  CO2 23   < > 23 22 24   GLUCOSE 145*   < > 76 87 109*  BUN 16   < > 12 7* 8  CREATININE 0.72   < > 0.69 0.63 0.63  CALCIUM  9.2   < > 9.3 8.6* 9.0  GFRNONAA >60   < > >60 >60 >60  PROT 6.2*  --   --   --   --   ALBUMIN 2.9*  --   --   --   --   AST 20  --   --   --   --   ALT 11  --   --   --   --   ALKPHOS 47  --   --   --   --   BILITOT 0.5  --   --   --   --    < > = values in this interval not displayed.    Radiographic Studies: I have personally reviewed the radiological images as listed and agreed with the findings in the report.  None relevant to review  ASSESSMENT & PLAN:  Patient is a 89 y.o. male presenting for newly diagnosed gastric adenocarcinoma  Assessment & Plan Gastric adenocarcinoma [facility]) Patient has a biopsy-proven gastric adenocarcinoma  - Discussed with the patient that EUS is required for staging purposes. - Patient would also  require a PET scan to assess extent of the disease - Discussed that for localized gastric adenocarcinoma the treatment options include perioperative chemotherapy followed by gastrectomy-this is an extensive procedure and goals of care should be discussed prior to this and considering his age and comorbidities, I do not think patient is a surgical candidate. - Next available option would be chemoradiation.  We will consider this after discussing goals of care with the patient on next visit after the PET scan and EUS. - As per the daughter patient is scheduled for a CT scan at the [facility] tomorrow.  Will obtain those records.  Return to clinic in 2 weeks after PET scan to discuss results and further management Iron deficiency anemia due to chronic blood loss The most likely cause of his anemia is due to chronic blood loss from gastric adenocarcinoma Lab Results  Component Value Date   IRON 17 (L) 06/30/2024   TIBC 320 06/30/2024   FERRITIN 7 (L) 06/30/2024   TSAT: 5   -We discussed some of the risks, benefits, and alternatives of intravenous iron infusions. The patient is symptomatic from anemia and the iron level is critically low. He tolerated oral iron supplement poorly and desires to achieve higher levels of iron faster for adequate hematopoesis. Some of the side-effects to be expected including risks of infusion reactions, phlebitis, headaches, nausea and fatigue.  The patient is willing to proceed. Patient education material was dispensed. Goal is to keep ferritin level greater than 50 and resolution of anemia -Start oral iron every other day.  Use MiraLAX  for constipation  Recheck labs in 8 weeks to assess response to IV iron  Weight loss Patient had a significant weight loss of 60 pounds in the past 1 year. Reports eating 2 meals a day and has some difficulties with chewing because of dentures.  - Encourage patient to eat soft meals 3 times a day along with protein supplements 2 times a  day - Will send a nutrition referral for weight loss VTE (venous thromboembolism) Patient has a history of  pulmonary embolism and was on Eliquis  previously.  - Considering risk versus benefits and patient severe bleeding from the carcinoma and  dieulafoy lesion of stomach, patient continuing to have black stools, will continue to hold Eliquis  at this time. Goals of care, counseling/discussion We discussed goals of care in detail with the patient.  - Discussed that patient has a gastric cancer of unknown stage at this time.  Treatment would require chemotherapy and with his age and comorbidities that chemotherapy may not be tolerable by him. - Patient currently considers to be full code and would like to discuss at the next visit based on his PET scan and EUS results.    Orders Placed This Encounter  Procedures   NM PET Image Initial (PI) Skull Base To Thigh    Standing Status:   Future    Expected Date:   07/24/2024    Expiration Date:   07/24/2025    If indicated for the ordered procedure, I authorize the administration of a radiopharmaceutical per Radiology protocol:   Yes    Preferred imaging location?:   [facility]    The total time spent in the appointment was 60 minutes encounter with patients including review of chart and various tests results, discussions about plan of care and coordination of care plan   All questions were answered. The patient knows to call the clinic with any problems, questions or concerns. No barriers to learning was detected.  Mickiel Dry, MD 8/10/20259:55 AM

## 2024-07-24 NOTE — Assessment & Plan Note (Addendum)
 The most likely cause of his anemia is due to chronic blood loss from gastric adenocarcinoma Lab Results  Component Value Date   IRON 17 (L) 06/30/2024   TIBC 320 06/30/2024   FERRITIN 7 (L) 06/30/2024   TSAT: 5   -We discussed some of the risks, benefits, and alternatives of intravenous iron infusions. The patient is symptomatic from anemia and the iron level is critically low. He tolerated oral iron supplement poorly and desires to achieve higher levels of iron faster for adequate hematopoesis. Some of the side-effects to be expected including risks of infusion reactions, phlebitis, headaches, nausea and fatigue.  The patient is willing to proceed. Patient education material was dispensed. Goal is to keep ferritin level greater than 50 and resolution of anemia -Start oral iron every other day.  Use MiraLAX  for constipation  Recheck labs in 8 weeks to assess response to IV iron

## 2024-07-28 ENCOUNTER — Encounter: Payer: Self-pay | Admitting: Oncology

## 2024-07-28 DIAGNOSIS — R634 Abnormal weight loss: Secondary | ICD-10-CM | POA: Insufficient documentation

## 2024-07-28 DIAGNOSIS — I8290 Acute embolism and thrombosis of unspecified vein: Secondary | ICD-10-CM | POA: Insufficient documentation

## 2024-07-28 NOTE — Assessment & Plan Note (Addendum)
 We discussed goals of care in detail with the patient.  - Discussed that patient has a gastric cancer of unknown stage at this time.  Treatment would require chemotherapy and with his age and comorbidities that chemotherapy may not be tolerable by him. - Patient currently considers to be full code and would like to discuss at the next visit based on his PET scan and EUS results.

## 2024-07-28 NOTE — Assessment & Plan Note (Signed)
 Patient has a biopsy-proven gastric adenocarcinoma  - Discussed with the patient that EUS is required for staging purposes. - Patient would also require a PET scan to assess extent of the disease - Discussed that for localized gastric adenocarcinoma the treatment options include perioperative chemotherapy followed by gastrectomy-this is an extensive procedure and goals of care should be discussed prior to this and considering his age and comorbidities, I do not think patient is a surgical candidate. - Next available option would be chemoradiation.  We will consider this after discussing goals of care with the patient on next visit after the PET scan and EUS. - As per the daughter patient is scheduled for a CT scan at the [facility] tomorrow.  Will obtain those records.  Return to clinic in 2 weeks after PET scan to discuss results and further management

## 2024-07-28 NOTE — Assessment & Plan Note (Signed)
 Patient had a significant weight loss of 60 pounds in the past 1 year. Reports eating 2 meals a day and has some difficulties with chewing because of dentures.  - Encourage patient to eat soft meals 3 times a day along with protein supplements 2 times a day - Will send a nutrition referral for weight loss

## 2024-07-28 NOTE — Assessment & Plan Note (Signed)
 Patient has a history of pulmonary embolism and was on Eliquis  previously.  - Considering risk versus benefits and patient severe bleeding from the carcinoma and  dieulafoy lesion of stomach, patient continuing to have black stools, will continue to hold Eliquis  at this time.

## 2024-07-30 ENCOUNTER — Encounter (HOSPITAL_COMMUNITY): Payer: Self-pay

## 2024-07-30 ENCOUNTER — Inpatient Hospital Stay

## 2024-07-30 ENCOUNTER — Other Ambulatory Visit: Payer: Self-pay | Admitting: Oncology

## 2024-07-30 VITALS — BP 114/64 | HR 64 | Temp 97.8°F | Resp 16

## 2024-07-30 DIAGNOSIS — D509 Iron deficiency anemia, unspecified: Secondary | ICD-10-CM | POA: Diagnosis not present

## 2024-07-30 DIAGNOSIS — D500 Iron deficiency anemia secondary to blood loss (chronic): Secondary | ICD-10-CM

## 2024-07-30 MED ORDER — HEPARIN SOD (PORK) LOCK FLUSH 100 UNIT/ML IV SOLN: 250.0000 [IU] | Freq: Once | INTRAVENOUS | Status: DC | PRN

## 2024-07-30 MED ORDER — SODIUM CHLORIDE 0.9 % IV SOLN: INTRAVENOUS | Status: DC

## 2024-07-30 MED ORDER — SODIUM CHLORIDE 0.9% FLUSH
3.0000 mL | Freq: Once | INTRAVENOUS | Status: DC | PRN
Start: 2024-07-30 — End: 2024-07-30

## 2024-07-30 MED ORDER — ALBUTEROL SULFATE HFA 108 (90 BASE) MCG/ACT IN AERS: 2.0000 | INHALATION_SPRAY | Freq: Once | RESPIRATORY_TRACT | Status: DC | PRN

## 2024-07-30 MED ORDER — SODIUM CHLORIDE 0.9 % IV SOLN
1000.0000 mg | Freq: Once | INTRAVENOUS | Status: AC
  Administered 2024-07-30 (×2): 1000 mg via INTRAVENOUS
  Filled 2024-07-30: qty 1000

## 2024-07-30 MED ORDER — METHYLPREDNISOLONE SODIUM SUCC 125 MG IJ SOLR: 125.0000 mg | Freq: Once | INTRAMUSCULAR | Status: DC | PRN

## 2024-07-30 MED ORDER — CETIRIZINE HCL 10 MG/ML IV SOLN
5.0000 mg | Freq: Once | INTRAVENOUS | Status: AC
  Administered 2024-07-30 (×2): 5 mg via INTRAVENOUS
  Filled 2024-07-30: qty 1

## 2024-07-30 MED ORDER — HEPARIN SOD (PORK) LOCK FLUSH 100 UNIT/ML IV SOLN: 500.0000 [IU] | Freq: Once | INTRAVENOUS | Status: DC | PRN

## 2024-07-30 MED ORDER — EPINEPHRINE 0.3 MG/0.3ML IJ SOAJ: 0.3000 mg | Freq: Once | INTRAMUSCULAR | Status: DC | PRN

## 2024-07-30 MED ORDER — ACETAMINOPHEN 325 MG PO TABS
650.0000 mg | ORAL_TABLET | Freq: Once | ORAL | Status: AC
Start: 2024-07-30 — End: 2024-07-30
  Administered 2024-07-30 (×2): 650 mg via ORAL
  Filled 2024-07-30: qty 2

## 2024-07-30 MED ORDER — ALTEPLASE 2 MG IJ SOLR: 2.0000 mg | Freq: Once | INTRAMUSCULAR | Status: DC | PRN

## 2024-07-30 MED ORDER — SODIUM CHLORIDE 0.9% FLUSH: 10.0000 mL | Freq: Once | INTRAVENOUS | Status: DC | PRN

## 2024-07-30 MED ORDER — DIPHENHYDRAMINE HCL 50 MG/ML IJ SOLN: 50.0000 mg | Freq: Once | INTRAMUSCULAR | Status: DC | PRN

## 2024-07-30 MED ORDER — FAMOTIDINE IN NACL 20-0.9 MG/50ML-% IV SOLN
20.0000 mg | Freq: Once | INTRAVENOUS | Status: AC | PRN
  Administered 2024-07-30 (×2): 20 mg via INTRAVENOUS

## 2024-07-30 MED ORDER — SODIUM CHLORIDE 0.9 % IV SOLN: Freq: Once | INTRAVENOUS | Status: DC | PRN

## 2024-07-30 NOTE — Progress Notes (Signed)
 1058- no chest pain now per pt.

## 2024-07-30 NOTE — Patient Instructions (Signed)

## 2024-07-30 NOTE — Progress Notes (Signed)
 Iron infusion information given to the patient and family.  All questions asked and answered.    Hypersensitivity Reaction note  Date of event: 07/30/24 Time of event: 1056 Generic name of drug involved: Ferric Derisomaltose  (Monoferric ) Name of provider notified of the hypersensitivity reaction: [place] Was agent that likely caused hypersensitivity reaction added to Allergies List within EMR? yes Chain of events including reaction signs/symptoms, treatment administered, and outcome (e.g., drug resumed; drug discontinued; sent to Emergency Department; etc.)   1056-patient complained of chest pain and foot pain. Iron infusion stopped.  Normal saline bolus started.   Patient has a history of neuropathy in feet per daughter.  No s/s of distress.  Vital signs stable. See flowsheet and MAR for medications given.  Dr. Davonna notified.   1057-DrSABRA Davonna in room with verbal order for Pepcid  IV.   1058 patient denied chest pain.  Resting with no s/s of distress noted.     1118 patient resting with daughter at side.  Denied chest pains.  No s/s of distress noted.  Vital signs stable.  See flowsheet.  May restart iron after a 30 minute wait verbal order Dr. Davonna.    1128 Iron infusion restarted.  Patient resting with family at side.  Denied chest pains.  Vital signs stable with no s/s of distress noted.    1131-Patient stated his chest felt like indigestion/gas.  Described as pressure.  Infusion stopped.  Denied SOB.  No s/s of distress noted.  Iron infusion stopped.  Dr. Davonna notified.  See flowsheet for vital signs.   1136 Discontinue the Monoferric  infusion and return next week for Feraheme verbal order Dr. Davonna.   Consuelo KATHEE Lager, RN 07/30/2024 12:13 PM   Peripheral IV site clean and dry with good blood return noted before and after infusion.  Band aid applied.  VSS with discharge and left in satisfactory condition with no s/s of distress noted.

## 2024-07-30 NOTE — Progress Notes (Signed)
 Hematology progress note:  Patient had repeated chest pains with Monoferric  infusion even after Pepcid  and premeds.  Will change to Feraheme with next infusion for patient intolerance to Monoferric .  Dustin Dry, MD Hematology/Oncology [facility]

## 2024-08-01 ENCOUNTER — Ambulatory Visit (HOSPITAL_COMMUNITY)
Admission: RE | Admit: 2024-08-01 | Discharge: 2024-08-01 | Disposition: A | Discharge status: Home / Self Care | Source: Ambulatory Visit | Attending: Oncology | Admitting: Oncology

## 2024-08-01 DIAGNOSIS — C169 Malignant neoplasm of stomach, unspecified: Secondary | ICD-10-CM | POA: Diagnosis present

## 2024-08-01 MED ORDER — FLUDEOXYGLUCOSE F - 18 (FDG) INJECTION
7.6900 | Freq: Once | INTRAVENOUS | Status: AC | PRN
  Administered 2024-08-01: 7.69 via INTRAVENOUS

## 2024-08-05 ENCOUNTER — Inpatient Hospital Stay: Admitting: Dietician

## 2024-08-05 ENCOUNTER — Telehealth: Payer: Self-pay | Admitting: Dietician

## 2024-08-05 NOTE — Telephone Encounter (Signed)
 Nutrition Assessment   Reason for Assessment: Referral (wt loss)   ASSESSMENT: 89 year old male with newly diagnosed gastric adenocarcinoma. Treatment plan currently under work-up. Patient is under the care of Dr. Davonna   HTN, stroke, VTE, GIB, DM, HLD, PE, diverticulosis, IDA  Spoke with patient via telephone. He reports doing okay today. Patient has a good appetite, usually eats 2 meals/day. He endorses 60 lb wt loss secondary to poor dentition and multiple extractions. Chewing foods well enough to feel comfortable swallowing is challenging. Patient ate scrambled eggs and liver pudding for breakfast. Recalls chicken pot pie for lunch.  Patient has Ensure, however does not drink these regularly. Patient denies gastric pain with intake. Denies nausea, vomiting, diarrhea. Takes colace as needed for intermittent constipation.    Nutrition Focused Physical Exam: Unable to complete (telephone)   Medications: lipitor, dulcolax, D3, clonidine , mucinex , B1, B12   Labs: 7/18 labs reviewed    Anthropometrics:   Height: 6' Weight: 152 lb 12.5 oz (8/6) UBW: 180 lb 12.4 oz (2023) BMI: 20.72    NUTRITION DIAGNOSIS: Unintended wt loss related to chewing difficulty as evidenced by pt report, soft diet, 60 lb wt loss from usual reported wt per pt   INTERVENTION:  Continue eating foods that are soft/moist in texture, ideas offered  Encourage 4-6 small meals vs 2-3 larger meals Recommend daily Ensure Complete/equivalent - pt reports receiving ONS from [place] Handouts + contact information mailed    MONITORING, EVALUATION, GOAL: Patient will tolerate increased calories and protein to minimize further wt loss    Next Visit: To be determined

## 2024-08-09 ENCOUNTER — Encounter: Payer: Self-pay | Admitting: Gastroenterology

## 2024-08-13 NOTE — Progress Notes (Incomplete)
 Patient Care Team: System, Provider Not In as PCP - General  Clinic Day:  08/15/2024  Referring physician: Vicci Afton CROME, MD   CHIEF COMPLAINT:  CC: Gastric adenocarcinoma    ASSESSMENT & PLAN:   Assessment & Plan: Dustin Chandler  is a 89 y.o. male with gastric adenocarcinoma.  Assessment & Plan Gastric adenocarcinoma [facility]) Patient has a biopsy-proven gastric adenocarcinoma CT scan with no evidence of metastasis PET scan: No evidence of metastasis consistent with local disease.  Also had rectal uptake concerning for rectal carcinoma.   -Patient is scheduled for EUS with Dr. Wilhelmenia in October.  Will reach out to Dr. Wilhelmenia to see if this can be moved closer - Discussed that for localized gastric adenocarcinoma the treatment options include perioperative chemotherapy followed by gastrectomy-this is an extensive procedure and goals of care should be discussed prior to this and considering his age and comorbidities, I do not think patient is a surgical candidate.  I discussed this with his surgeon from [facility] [place] who agrees with it.   - Next available option would be chemoradiation.  We discussed risks and benefits in detail.  Considering his age and comorbidities, chemoradiation might be intolerable for the patient but patient would consider this treatment at this time.  If patient desires treatment, can consider capecitabine with platinum.  Will refer to radiation oncology - Patient has rectal uptake on PET scan, will reach out to Dr. Cinderella to see if he can repeat a colonoscopy to rule out rectal carcinoma. - If patient has rectal carcinoma, we will readdress goals of care at that time. - We will also request for [facility] NGS on the specimen to see if patient can be a candidate for targeted therapies that can be better tolerated and overall better for the patient.  Return to clinic in 2 weeks after PET scan to discuss results and further management Iron deficiency anemia  due to chronic blood loss The most likely cause of his anemia is due to chronic blood loss from gastric adenocarcinoma Iron panel from 06/30/2024: TSAT: 5, ferritin: 7, iron: 17 Patient was unable to tolerate Monoferric .  He reported chest pains with infusion that did not go down with Pepcid  or Solu-Medrol .  -Will schedule him for an alternate IV iron at this time.  Feraheme is preferred. -Continue oral iron every other day  Will repeat labs in 1 month  Rectal abnormality Patient has abnormal rectal uptake on PET scan Previous colonoscopy could not visualize rectum very well because of stool content  - Will reach out to Dr. Cinderella to see if he can repeat a colonoscopy/flexible sigmoidoscopy to look up the rectal area to rule out rectal carcinoma. VTE (venous thromboembolism) Patient has a history of pulmonary embolism and was on prophylactic dose Eliquis  previously.  - Considering risk versus benefits and patient severe bleeding from the carcinoma and  dieulafoy lesion of stomach, patient continuing to have black stools, will continue to hold Eliquis  at this time. -Will repeat CBC today.  If improved, can consider restarting prophylactic dose of Eliquis  Goals of care, counseling/discussion We re-discussed goals of care in detail with the patient.  - Discussed that patient has a gastric cancer of unknown stage at this time but appears to be localized.  Treatment would require chemotherapy and with his age and comorbidities that chemotherapy may not be tolerable by him. - Patient currently considers to be full code and short period of trial on ventilator - He wants to try any  kind of treatment available.    The patient understands the plans discussed today and is in agreement with them.  He knows to contact our office if he develops concerns prior to his next appointment.  I provided 60 minutes of face-to-face time during this encounter and > 50% was spent counseling as documented under my  assessment and plan.    Dustin Chandler,acting as a Neurosurgeon for Mickiel Dry, MD.,have documented all relevant documentation on the behalf of Mickiel Dry, MD,as directed by  Mickiel Dry, MD while in the presence of Mickiel Dry, MD.  I, Mickiel Dry MD, have reviewed the above documentation for accuracy and completeness, and I agree with the above.     Dustin SAUNDERS Ege  [facility] [place] - A DEPT OF [facility] [address] [place] [address] Dept: [phone] Dept Fax: [phone]   No orders of the defined types were placed in this encounter.    ONCOLOGY HISTORY:   Gastric adenocarcinoma:  -06/30/2024: Admitted to the hospital with a hemoglobin of 7.1 -07/04/2024: Endoscopy: Normal esophagus. Actively bleeding Dieulafoy lesion of stomach. Hemostasis achieved. Malignant gastric tumor in the gastric antrum. Biopsied x 8 . Normal duodenal bulb and second portion of the duodenum.  Pathology: Gastric mass, biopsy: Invasive moderate to poorly differentiated adenocarcinoma -08/01/2024: Initial PET: Markedly hypermetabolic concentric soft tissue mass in gastric body, consistent with primary gastric carcinoma. 8 mm hypermetabolic lymph node along the inferior aspect of the gastric antrum, suspicious for metastatic disease. 8 mm subpleural nodule in the posterolateral left upper lobe shows mild FDG uptake, but is not hypermetabolic. 2.8 cm hypermetabolic mass in the left lateral wall of the rectum, highly suspicious for primary rectal carcinoma. - 07/30/2024: CT chest abdomen and pelvis w/wo contrast([facility]): Irregular wall thickening of the gastric antrum, likely corresponds to known adenocarcinoma.  No definite evidence of metastatic disease.  This study is limited by the absence of IV contrast due to IV extravasation. - 08/01/2024: PET: Markedly hypermetabolic concentric soft tissue mass in  gastric body,consistent with primary gastric carcinoma. 8 mm hypermetabolic lymph node along the inferior aspect of the gastric antrum, suspicious for metastatic disease. 8 mm subpleural nodule in the posterolateral left upper lobe shows mild FDG uptake, but is not hypermetabolic. 2.8 cm hypermetabolic mass in the left lateral wall of the rectum, highly suspicious for primary rectal carcinoma. Recommend correlation with rectal exam and colonoscopy.   Current Treatment:  TBD  INTERVAL HISTORY:   Dustin Chandler is here today for follow up. Patient is accompanied by his 3 daughters.   Patient reported that his lightened up significantly.  His appetite has improved and he has gained weight.  He is overall started to feel better.  He has no other complaints today.  We readdressed goals of care again.  Patient wants to be full code at this time with a short period of trial on ventilator.   I have reviewed the past medical history, past surgical history, social history and family history with the patient and they are unchanged from previous note.  ALLERGIES:  is allergic to lisinopril, monoferric  [ferric derisomaltose ], amlodipine , fish allergy, peanut-containing drug products, penicillin g, penicillins, and tomato.  MEDICATIONS:  Current Outpatient Medications  Medication Sig Dispense Refill   acetaminophen  (TYLENOL ) 325 MG tablet Take 2 tablets (650 mg total) by mouth every 6 (six) hours as needed for mild pain (pain score 1-3) or fever (  or Fever >/= 101).     atorvastatin  (LIPITOR) 40 MG tablet Take 1 tablet (40 mg total) by mouth every evening. 30 tablet 3   bisacodyl  (DULCOLAX) 5 MG EC tablet Take 1 tablet (5 mg total) by mouth daily as needed for moderate constipation.     cetirizine  (ZYRTEC ) 10 MG tablet Take 10 mg by mouth daily as needed for allergies.     Cholecalciferol (VITAMIN D3) LIQD Take 1 drop by mouth daily.     cloNIDine  (CATAPRES ) 0.1 MG tablet Take 0.1 mg by mouth daily.      diphenhydrAMINE  (BENADRYL ) 25 MG tablet Take 25 mg by mouth at bedtime.     guaiFENesin  (MUCINEX ) 600 MG 12 hr tablet Take 1 tablet (600 mg total) by mouth 2 (two) times daily as needed. (Patient taking differently: Take 600 mg by mouth 2 (two) times daily as needed for cough or to loosen phlegm.) 20 tablet 0   hydrALAZINE  (APRESOLINE ) 25 MG tablet Take 1 tablet (25 mg total) by mouth in the morning and at bedtime. 60 tablet 11   thiamine  (VITAMIN B-1) 100 MG tablet Take 100 mg by mouth daily.     vitamin B-12 (CYANOCOBALAMIN) 1000 MCG tablet Take 1 tablet (1,000 mcg total) by mouth daily. 30 tablet 3   No current facility-administered medications for this visit.    REVIEW OF SYSTEMS:   Constitutional: Denies fevers, chills or abnormal weight loss Eyes: Denies blurriness of vision Ears, nose, mouth, throat, and face: Denies mucositis or sore throat Respiratory: Denies cough, dyspnea or wheezes Cardiovascular: Denies palpitation, chest discomfort or lower extremity swelling Gastrointestinal:  Denies nausea, heartburn or change in bowel habits Skin: Denies abnormal skin rashes Lymphatics: Denies new lymphadenopathy or easy bruising Neurological:Denies numbness, tingling or new weaknesses Behavioral/Psych: Mood is stable, no new changes  All other systems were reviewed with the patient and are negative.   VITALS:  There were no vitals taken for this visit.  Wt Readings from Last 3 Encounters:  07/24/24 152 lb 12.5 oz (69.3 kg)  07/04/24 159 lb 9.8 oz (72.4 kg)  08/15/22 165 lb 8 oz (75.1 kg)    There is no height or weight on file to calculate BMI.  Performance status (ECOG): 2 - Symptomatic, <50% confined to bed  PHYSICAL EXAM:   GENERAL:alert, no distress and comfortable, frail male SKIN: skin color, texture, turgor are normal, no rashes or significant lesions LYMPH:  no palpable lymphadenopathy in the cervical, axillary or inguinal LUNGS: clear to auscultation and  percussion with normal breathing effort HEART: regular rate & rhythm and no murmurs and no lower extremity edema ABDOMEN:abdomen soft, non-tender and normal bowel sounds Musculoskeletal:no cyanosis of digits and no clubbing  NEURO: alert & oriented x 3 with fluent speech, no focal motor/sensory deficits  LABORATORY DATA:  I have reviewed the data as listed    Component Value Date/Time   NA 138 07/05/2024 0431   K 3.4 (L) 07/05/2024 0431   CL 109 07/05/2024 0431   CO2 24 07/05/2024 0431   GLUCOSE 109 (H) 07/05/2024 0431   BUN 8 07/05/2024 0431   CREATININE 0.63 07/05/2024 0431   CREATININE 0.80 06/29/2012 1009   CALCIUM  9.0 07/05/2024 0431   PROT 6.2 (L) 06/30/2024 1449   ALBUMIN 2.9 (L) 06/30/2024 1449   AST 20 06/30/2024 1449   ALT 11 06/30/2024 1449   ALKPHOS 47 06/30/2024 1449   BILITOT 0.5 06/30/2024 1449   GFRNONAA >60 07/05/2024 0431   GFRAA >60  01/03/2020 1824     Lab Results  Component Value Date   WBC 4.1 07/05/2024   NEUTROABS 4.8 08/13/2022   HGB 8.6 (L) 07/05/2024   HCT 27.7 (L) 07/05/2024   MCV 85.0 07/05/2024   PLT 233 07/05/2024     RADIOGRAPHIC STUDIES: I have personally reviewed the radiological images as listed and agreed with the findings in the report.  NM PET Image Initial (PI) Skull Base To Thigh CLINICAL DATA:  Initial treatment strategy for gastric carcinoma.  EXAM: NUCLEAR MEDICINE PET SKULL BASE TO THIGH  TECHNIQUE: 7.7 mCi F-18 FDG was injected intravenously. Full-ring PET imaging was performed from the skull base to thigh after the radiotracer. CT data was obtained and used for attenuation correction and anatomic localization.  Fasting blood glucose: 141 mg/dl  COMPARISON:  None Available.  FINDINGS: Mediastinal blood-pool activity (background): SUV max = 2.2  Liver activity (reference): SUV max = N/A  NECK:  No hypermetabolic lymph nodes or masses.  Incidental CT findings:  None.  CHEST: No hypermetabolic lymph nodes. 8  mm subpleural nodule in the posterolateral left upper lobe on image 53/202 shows mild FDG uptake with SUV max of 1.4. No other suspicious pulmonary nodule seen.  Incidental CT findings:  None.  ABDOMEN/PELVIS: Concentric soft tissue mass is seen in gastric body which shows marked hypermetabolism, with SUV max of 15.5. This is consistent with primary gastric carcinoma. An 8 mm hypermetabolic lymph node is seen along the inferior aspect of the gastric antrum on image 83/202 which has SUV max of 3.1, and is suspicious for metastatic disease. No other hypermetabolic lymph nodes seen within the abdomen or pelvis. No abnormal hypermetabolic activity within the liver, pancreas, adrenal glands, or spleen.  Masslike soft tissue density measuring 2.8 x 2.3 cm is seen in the left lateral wall the rectum which shows marked hypertrophy metabolism, with SUV max of 17.0. This is highly suspicious for primary rectal carcinoma.  Incidental CT findings:  Mildly enlarged prostate.  SKELETON: No focal hypermetabolic bone lesions to suggest skeletal metastasis.  Incidental CT findings:  None.  IMPRESSION: Markedly hypermetabolic concentric soft tissue mass in gastric body, consistent with primary gastric carcinoma.  8 mm hypermetabolic lymph node along the inferior aspect of the gastric antrum, suspicious for metastatic disease.  8 mm subpleural nodule in the posterolateral left upper lobe shows mild FDG uptake, but is not hypermetabolic. This is indeterminate, and differential diagnosis includes granuloma, metastasis, and primary bronchogenic carcinoma.  2.8 cm hypermetabolic mass in the left lateral wall of the rectum, highly suspicious for primary rectal carcinoma. Recommend correlation with rectal exam and colonoscopy.  Electronically Signed   By: Norleen DELENA Kil M.D.   On: 08/09/2024 14:07

## 2024-08-14 NOTE — Assessment & Plan Note (Addendum)
 Patient has a biopsy-proven gastric adenocarcinoma  - Discussed with the patient that EUS is required for staging purposes. - Patient would also require a PET scan to assess extent of the disease - Discussed that for localized gastric adenocarcinoma the treatment options include perioperative chemotherapy followed by gastrectomy-this is an extensive procedure and goals of care should be discussed prior to this and considering his age and comorbidities, I do not think patient is a surgical candidate. - Next available option would be chemoradiation.  We will consider this after discussing goals of care with the patient on next visit after the PET scan and EUS. - As per the daughter patient is scheduled for a CT scan at the [facility] tomorrow.  Will obtain those records.  Return to clinic in 2 weeks after PET scan to discuss results and further management

## 2024-08-14 NOTE — Assessment & Plan Note (Addendum)
 The most likely cause of his anemia is due to chronic blood loss from gastric adenocarcinoma Iron panel from 06/30/2024: TSAT: 5, ferritin: 7, iron: 17 Patient was unable to tolerate Monoferric .  He reported chest pains with infusion that did not go down with Pepcid  or Solu-Medrol .  -Will schedule him for an alternate IV iron at this time.  Feraheme is preferred. -Continue oral iron every other day  Will repeat labs in 1 month

## 2024-08-15 ENCOUNTER — Inpatient Hospital Stay (HOSPITAL_BASED_OUTPATIENT_CLINIC_OR_DEPARTMENT_OTHER): Admitting: Oncology

## 2024-08-15 ENCOUNTER — Inpatient Hospital Stay

## 2024-08-15 VITALS — BP 134/86 | HR 72 | Temp 98.2°F | Resp 18 | Wt 158.8 lb

## 2024-08-15 DIAGNOSIS — D509 Iron deficiency anemia, unspecified: Secondary | ICD-10-CM | POA: Diagnosis not present

## 2024-08-15 DIAGNOSIS — C169 Malignant neoplasm of stomach, unspecified: Secondary | ICD-10-CM | POA: Diagnosis not present

## 2024-08-15 DIAGNOSIS — K629 Disease of anus and rectum, unspecified: Secondary | ICD-10-CM

## 2024-08-15 DIAGNOSIS — D500 Iron deficiency anemia secondary to blood loss (chronic): Secondary | ICD-10-CM | POA: Diagnosis not present

## 2024-08-15 DIAGNOSIS — I8290 Acute embolism and thrombosis of unspecified vein: Secondary | ICD-10-CM | POA: Diagnosis not present

## 2024-08-15 DIAGNOSIS — Z7189 Other specified counseling: Secondary | ICD-10-CM

## 2024-08-15 LAB — COMPREHENSIVE METABOLIC PANEL WITH GFR
ALT: 12 U/L (ref 0–44)
AST: 18 U/L (ref 15–41)
Albumin: 3 g/dL — ABNORMAL LOW (ref 3.5–5.0)
Alkaline Phosphatase: 55 U/L (ref 38–126)
Anion gap: 8 (ref 5–15)
BUN: 19 mg/dL (ref 8–23)
CO2: 24 mmol/L (ref 22–32)
Calcium: 9.9 mg/dL (ref 8.9–10.3)
Chloride: 108 mmol/L (ref 98–111)
Creatinine, Ser: 0.67 mg/dL (ref 0.61–1.24)
GFR, Estimated: >60 mL/min (ref >60)
Glucose, Bld: 114 mg/dL — ABNORMAL HIGH (ref 70–99)
Potassium: 4.3 mmol/L (ref 3.5–5.1)
Sodium: 140 mmol/L (ref 135–145)
Total Bilirubin: 0.7 mg/dL (ref 0.0–1.2)
Total Protein: 6.5 g/dL (ref 6.5–8.1)

## 2024-08-15 LAB — CBC WITH DIFFERENTIAL/PLATELET
Abs Immature Granulocytes: 0.01 K/uL (ref 0.00–0.07)
Basophils Absolute: 0 K/uL (ref 0.0–0.1)
Basophils Relative: 1 %
Eosinophils Absolute: 0.1 K/uL (ref 0.0–0.5)
Eosinophils Relative: 2 %
HCT: 29.9 % — ABNORMAL LOW (ref 39.0–52.0)
Hemoglobin: 9 g/dL — ABNORMAL LOW (ref 13.0–17.0)
Immature Granulocytes: 0 %
Lymphocytes Relative: 23 %
Lymphs Abs: 1.3 K/uL (ref 0.7–4.0)
MCH: 25.7 pg — ABNORMAL LOW (ref 26.0–34.0)
MCHC: 30.1 g/dL (ref 30.0–36.0)
MCV: 85.4 fL (ref 80.0–100.0)
Monocytes Absolute: 0.5 K/uL (ref 0.1–1.0)
Monocytes Relative: 10 %
Neutro Abs: 3.5 K/uL (ref 1.7–7.7)
Neutrophils Relative %: 64 %
Platelets: 242 K/uL (ref 150–400)
RBC: 3.5 MIL/uL — ABNORMAL LOW (ref 4.22–5.81)
RDW: 18.6 % — ABNORMAL HIGH (ref 11.5–15.5)
WBC: 5.4 K/uL (ref 4.0–10.5)
nRBC: 0 % (ref 0.0–0.2)

## 2024-08-15 NOTE — Patient Instructions (Signed)
 [facility] at [facility] Discharge Instructions   You were seen and examined today by Dr. Davonna.  She reviewed the results of your PET scan. It is showing that the cancer in the stomach has not spread to other parts of the body. It is also showing that there may a second cancer in the rectum.   She discussed treatment with chemoradiation. We will make a referral to radiation oncology in [place].   We will also make a referral back to Dr. Cinderella to investigate further the possibility of rectal cancer.    We will proceed with your treatment today.   Return as scheduled.    Thank you for choosing [facility] at [facility] to provide your oncology and hematology care.  To afford each patient quality time with our provider, please arrive at least 15 minutes before your scheduled appointment time.   If you have a lab appointment with the Cancer Center please come in thru the Main Entrance and check in at the main information desk.  You need to re-schedule your appointment should you arrive 10 or more minutes late.  We strive to give you quality time with our providers, and arriving late affects you and other patients whose appointments are after yours.  Also, if you no show three or more times for appointments you may be dismissed from the clinic at the providers discretion.     Again, thank you for choosing [facility].  Our hope is that these requests will decrease the amount of time that you wait before being seen by our physicians.       _____________________________________________________________  Should you have questions after your visit to [facility], please contact our office at [phone] and follow the prompts.  Our office hours are 8:00 a.m. and 4:30 p.m. Monday - Friday.  Please note that voicemails left after 4:00 p.m. may not be returned until the following business day.  We are closed weekends and major  holidays.  You do have access to a nurse 24-7, just call the main number to the clinic [phone] and do not press any options, hold on the line and a nurse will answer the phone.    For prescription refill requests, have your pharmacy contact our office and allow 72 hours.    Due to Covid, you will need to wear a mask upon entering the hospital. If you do not have a mask, a mask will be given to you at the Main Entrance upon arrival. For doctor visits, patients may have 1 support person age 37 or older with them. For treatment visits, patients can not have anyone with them due to social distancing guidelines and our immunocompromised population.

## 2024-08-16 ENCOUNTER — Telehealth (INDEPENDENT_AMBULATORY_CARE_PROVIDER_SITE_OTHER): Payer: Self-pay | Admitting: Gastroenterology

## 2024-08-16 ENCOUNTER — Encounter: Payer: Self-pay | Admitting: Oncology

## 2024-08-16 DIAGNOSIS — K629 Disease of anus and rectum, unspecified: Secondary | ICD-10-CM | POA: Insufficient documentation

## 2024-08-16 NOTE — Assessment & Plan Note (Addendum)
 Patient has a history of pulmonary embolism and was on prophylactic dose Eliquis  previously.  - Considering risk versus benefits and patient severe bleeding from the carcinoma and  dieulafoy lesion of stomach, patient continuing to have black stools, will continue to hold Eliquis  at this time. -Will repeat CBC today.  If improved, can consider restarting prophylactic dose of Eliquis 

## 2024-08-16 NOTE — Telephone Encounter (Signed)
 Patient with new diagnosis of gastric cancer . I was reached out by Dr Davonna given recent abnormal PET-CT and recent colonoscopy with inadequate examination of the rectum given large amount of stool   PET/CT  IMPRESSION: Markedly hypermetabolic concentric soft tissue mass in gastric body, consistent with primary gastric carcinoma.   8 mm hypermetabolic lymph node along the inferior aspect of the gastric antrum, suspicious for metastatic disease.   8 mm subpleural nodule in the posterolateral left upper lobe shows mild FDG uptake, but is not hypermetabolic. This is indeterminate, and differential diagnosis includes granuloma, metastasis, and primary bronchogenic carcinoma.   2.8 cm hypermetabolic mass in the left lateral wall of the rectum, highly suspicious for primary rectal carcinoma. Recommend correlation with rectal exam and colonoscopy.  06/2024 Colonoscopy  A large amount of stool was found in the rectum, precluding visualization. Lavage of the area was performed using a large amount of sterile water, resulting in incomplete clearance with fair visualization.   Spoke to the patient daughter (Frantine [phone] ) and family over the phone today regarding these findings   They are agreeable for Flexible Sigmoidoscopy to examine the rectum and exclude any underlying lesion  , will give bowel prep for adequate examination

## 2024-08-16 NOTE — Assessment & Plan Note (Signed)
 Patient has abnormal rectal uptake on PET scan Previous colonoscopy could not visualize rectum very well because of stool content  - Will reach out to Dr. Cinderella to see if he can repeat a colonoscopy/flexible sigmoidoscopy to look up the rectal area to rule out rectal carcinoma.

## 2024-08-16 NOTE — Assessment & Plan Note (Addendum)
 We re-discussed goals of care in detail with the patient.  - Discussed that patient has a gastric cancer of unknown stage at this time but appears to be localized.  Treatment would require chemotherapy and with his age and comorbidities that chemotherapy may not be tolerable by him. - Patient currently considers to be full code and short period of trial on ventilator - He wants to try any kind of treatment available.

## 2024-08-17 LAB — CEA: CEA: 3.1 ng/mL (ref 0.0–4.7)

## 2024-08-20 MED ORDER — PEG 3350-KCL-NA BICARB-NACL 420 G PO SOLR: 4000.0000 mL | Freq: Once | ORAL | 0 refills | Status: AC

## 2024-08-20 NOTE — Addendum Note (Signed)
 Addended by: DALLIE LIONEL RAMAN on: 08/20/2024 10:35 AM   Modules accepted: Orders

## 2024-08-20 NOTE — Telephone Encounter (Signed)
 Spoke with pt daughter. Patient has not been taking eliquis  since his hospitalization and insure when it will be restarted. Reports hematology prescribes his eiquis.   Patient has been scheduled for 9/19 for flex sig. Aware will send rx to pharmacy and will send instructions to his mychart.

## 2024-08-22 ENCOUNTER — Telehealth (INDEPENDENT_AMBULATORY_CARE_PROVIDER_SITE_OTHER): Payer: Self-pay | Admitting: Gastroenterology

## 2024-08-22 NOTE — Telephone Encounter (Signed)
 Pt daughter had left voicemail on 08/20/24 at 4:45pm asking about FMLA for father since she was with him in the hospital. Daughter Joesphine Floras) did not leave a DOB for father so I had called her back and asked for DOB. Pt returned call this morning and gave DOB and spelling of last name. Daughter states she is needing FMLA to be filled out due to pt being in hospital from 06/30/24-07/05/24. Dr.Ahmed completed an EGD/TCS on patient. Advised daughter that since we did not admit patient, we would not be able to complete FMLA. FMLA would have to come from PCP or oncology whom is following his for Gastric Adenocarcinoma. Daughter verbalized understanding.

## 2024-08-23 ENCOUNTER — Inpatient Hospital Stay: Attending: Oncology

## 2024-08-23 VITALS — BP 126/72 | HR 62 | Temp 97.0°F | Resp 18

## 2024-08-23 DIAGNOSIS — D500 Iron deficiency anemia secondary to blood loss (chronic): Secondary | ICD-10-CM | POA: Insufficient documentation

## 2024-08-23 MED ORDER — CETIRIZINE HCL 10 MG/ML IV SOLN
5.0000 mg | Freq: Once | INTRAVENOUS | Status: AC
  Administered 2024-08-23: 5 mg via INTRAVENOUS
  Filled 2024-08-23: qty 1

## 2024-08-23 MED ORDER — SODIUM CHLORIDE 0.9 % IV SOLN
INTRAVENOUS | Status: DC
Start: 2024-08-23 — End: 2024-08-23

## 2024-08-23 MED ORDER — SODIUM CHLORIDE 0.9 % IV SOLN
510.0000 mg | Freq: Once | INTRAVENOUS | Status: AC
  Administered 2024-08-23: 510 mg via INTRAVENOUS
  Filled 2024-08-23: qty 510

## 2024-08-23 MED ORDER — FAMOTIDINE IN NACL 20-0.9 MG/50ML-% IV SOLN
20.0000 mg | Freq: Once | INTRAVENOUS | Status: AC | PRN
  Administered 2024-08-23: 20 mg via INTRAVENOUS

## 2024-08-23 MED ORDER — METHYLPREDNISOLONE SODIUM SUCC 125 MG IJ SOLR
125.0000 mg | Freq: Once | INTRAMUSCULAR | Status: AC | PRN
  Administered 2024-08-23: 125 mg via INTRAVENOUS

## 2024-08-23 MED ORDER — SODIUM CHLORIDE 0.9 % IV SOLN: Freq: Once | INTRAVENOUS | Status: AC

## 2024-08-23 MED ORDER — ACETAMINOPHEN 325 MG PO TABS
650.0000 mg | ORAL_TABLET | Freq: Once | ORAL | Status: AC
  Administered 2024-08-23: 650 mg via ORAL
  Filled 2024-08-23: qty 2

## 2024-08-23 NOTE — Progress Notes (Signed)
 Patient presents today for Feraheme infusion. Patient received Monoferric  in the past and was unable to tolerate due to a reaction. Patient states, he was switched to Feraheme after the monoferric  due to chest pain.   13:53 pm patient called out with chest pain.  13:57 Monoferric  stopped, 1 liter of normal saline bolus initiated. Patient has complaints of chest pain, flushing with indigestion.  13:58 pm 149/99, 84, 20, respirations, 99%.   13:59 pm Delon Hope NP at the bedside. Patient vomiting with complaints of chest pain and indigestion.   14:01 125 mg of SOLU-MEDROL  given IV push.  14:02 20 mg of Pepcid  given IV. 123/67, 88, 98%, 20 respirations. Patient vomiting. Delon Hope NP at the bedside.   Orders received to monitor patient for 30 minutes and give 500 mls fluid bolus and then patient may be discharged home.   Hypersensitivity Reaction Patient After-Care Instructions   Signs of a Mild Allergic Reaction: Itchy and slightly swollen skin Change in skin tone or color, like red or a deeper shade of the skin tone Stuffy, runny nose Sneezing Itchy, watery eyes Red bumps (hives) anywhere on the body  Signs of a Severe Allergic Reaction: Swelling of the mouth or tongue Trouble swallowing or speaking Wheezing or trouble breathing Abdominal pain, nausea, vomiting, or diarrhea Dizziness or fainting  What Can I Do About Allergic Reactions? For mild allergic reactions, you may take over the counter medications such as Diphenhydramine  (Benadryl ), Loratadine (Claritin), Famotidine  (Pepcid ), and Acetaminophen  (Tylenol ) for symptom relief.  If the symptoms are severe, call 911 for emergency medical help. If you have an epi-pen available, use it as directed right away and call 911 for emergent assistance.  Your oncology care team will be contacting you the next business day to follow-up on your symptoms.    Hypersensitivity Reaction note  Date of event: 08/23/24 Time of event:  13:53. Generic name of drug involved: Ferumozytol. Name of provider notified of the hypersensitivity reaction: Delon Hope NP. Was agent that likely caused hypersensitivity reaction added to Allergies List within EMR? yes Chain of events including reaction signs/symptoms, treatment administered, and outcome (e.g., drug resumed; drug discontinued; sent to Emergency Department; etc.) Patient called out with chest pain and pressure. Patient diaphoretic, nauseas, vomiting. See progress notes for treatment. Drug discontinued. Patient monitored for 30 minutes post hypersensitivity reaction and then discharged home with stable vital signs.   Leotis JONETTA Nasuti, RN 08/23/2024 2:43 PM  No complaints at this time. Discharged from clinic by wheel chair in stable condition. Alert and oriented x 3. F/U with [facility] as scheduled.

## 2024-08-23 NOTE — Patient Instructions (Signed)
 [facility] [place] - A DEPT OF [facility]  Discharge Instructions: Thank you for choosing [facility] to provide your oncology and hematology care.  If you have a lab appointment with the Cancer Center - please note that after April 8th, 2024, all labs will be drawn in the cancer center.  You do not have to check in or register with the main entrance as you have in the past but will complete your check-in in the cancer center.  Wear comfortable clothing and clothing appropriate for easy access to any Portacath or PICC line.   We strive to give you quality time with your provider. You may need to reschedule your appointment if you arrive late (15 or more minutes).  Arriving late affects you and other patients whose appointments are after yours.  Also, if you miss three or more appointments without notifying the office, you may be dismissed from the clinic at the provider's discretion.      For prescription refill requests, have your pharmacy contact our office and allow 72 hours for refills to be completed.    Today you received the following chemotherapy and/or immunotherapy agents feraheme.       To help prevent nausea and vomiting after your treatment, we encourage you to take your nausea medication as directed.  BELOW ARE SYMPTOMS THAT SHOULD BE REPORTED IMMEDIATELY: *FEVER GREATER THAN 100.4 F (38 C) OR HIGHER *CHILLS OR SWEATING *NAUSEA AND VOMITING THAT IS NOT CONTROLLED WITH YOUR NAUSEA MEDICATION *UNUSUAL SHORTNESS OF BREATH *UNUSUAL BRUISING OR BLEEDING *URINARY PROBLEMS (pain or burning when urinating, or frequent urination) *BOWEL PROBLEMS (unusual diarrhea, constipation, pain near the anus) TENDERNESS IN MOUTH AND THROAT WITH OR WITHOUT PRESENCE OF ULCERS (sore throat, sores in mouth, or a toothache) UNUSUAL RASH, SWELLING OR PAIN  UNUSUAL VAGINAL DISCHARGE OR ITCHING   Items with * indicate a potential emergency and should be followed  up as soon as possible or go to the Emergency Department if any problems should occur.  Please show the CHEMOTHERAPY ALERT CARD or IMMUNOTHERAPY ALERT CARD at check-in to the Emergency Department and triage nurse.  Should you have questions after your visit or need to cancel or reschedule your appointment, please contact [facility] [place] - A DEPT OF [facility] [phone]  and follow the prompts.  Office hours are 8:00 a.m. to 4:30 p.m. Monday - Friday. Please note that voicemails left after 4:00 p.m. may not be returned until the following business day.  We are closed weekends and major holidays. You have access to a nurse at all times for urgent questions. Please call the main number to the clinic [phone] and follow the prompts.  For any non-urgent questions, you may also contact your provider using MyChart. We now offer e-Visits for anyone 55 and older to request care online for non-urgent symptoms. For details visit [email or web address].   Also download the MyChart app! Go to the app store, search MyChart, open the app, select [place], and log in with your MyChart username and password.  Ferumoxytol  Injection What is this medication? FERUMOXYTOL  (FER ue MOX i tol) treats low levels of iron in your body (iron deficiency anemia). Iron is a mineral that plays an important role in making red blood cells, which carry oxygen from your lungs to the rest of your body. This medicine may be used for other purposes; ask your health care provider or pharmacist if you have  questions. COMMON BRAND NAME(S): Feraheme What should I tell my care team before I take this medication? They need to know if you have any of these conditions: Anemia not caused by low iron levels High levels of iron in the blood Magnetic resonance imaging (MRI) test scheduled An unusual or allergic reaction to iron, other medications, foods, dyes, or preservatives Pregnant or trying to get  pregnant Breastfeeding How should I use this medication? This medication is injected into a vein. It is given by your care team in a hospital or clinic setting. Talk to your care team the use of this medication in children. Special care may be needed. Overdosage: If you think you have taken too much of this medicine contact a poison control center or emergency room at once. NOTE: This medicine is only for you. Do not share this medicine with others. What if I miss a dose? It is important not to miss your dose. Call your care team if you are unable to keep an appointment. What may interact with this medication? Other iron products This list may not describe all possible interactions. Give your health care provider a list of all the medicines, herbs, non-prescription drugs, or dietary supplements you use. Also tell them if you smoke, drink alcohol, or use illegal drugs. Some items may interact with your medicine. What should I watch for while using this medication? Visit your care team for regular checks on your progress. Tell your care team if your symptoms do not start to get better or if they get worse. You may need blood work done while you are taking this medication. You may need to eat more foods that contain iron. Talk to your care team. Foods that contain iron include whole grains or cereals, dried fruits, beans, peas, leafy green vegetables, and organ meats (liver, kidney). What side effects may I notice from receiving this medication? Side effects that you should report to your care team as soon as possible: Allergic reactions--skin rash, itching, hives, swelling of the face, lips, tongue, or throat Low blood pressure--dizziness, feeling faint or lightheaded, blurry vision Shortness of breath Side effects that usually do not require medical attention (report to your care team if they continue or are bothersome): Flushing Headache Joint pain Muscle pain Nausea Pain, redness, or  irritation at injection site This list may not describe all possible side effects. Call your doctor for medical advice about side effects. You may report side effects to FDA at 1-800-FDA-1088. Where should I keep my medication? This medication is given in a hospital or clinic. It will not be stored at home. NOTE: This sheet is a summary. It may not cover all possible information. If you have questions about this medicine, talk to your doctor, pharmacist, or health care provider.  2024 Elsevier/Gold Standard (2023-07-26 00:00:00)

## 2024-08-26 ENCOUNTER — Encounter (HOSPITAL_COMMUNITY): Payer: Self-pay

## 2024-08-27 ENCOUNTER — Encounter: Payer: Self-pay | Admitting: Gastroenterology

## 2024-08-27 NOTE — Progress Notes (Signed)
 A referral was previously placed for this patient to see me. Looks like he was scheduled for a clinic visit rather than for a procedure that was recommended/needed. He has newly diagnosed gastric adenocarcinoma. I have spoken with the patient's oncologist. Will work on scheduling him for next available upper EUS with me. Should we have an earlier availability due to cancellation, we will try to work on having this patient come in sooner if possible. He does not need a clinic appointment with me.  Patty, Please work on scheduling this patient for next available upper EUS for staging.  May use my October hospital date of 10/20 for now. He does not need a clinic appointment with me. Please let Dr. Anda and her team know once he has been scheduled. If we do have a cancellation, hopefully we can get him in sooner. Thanks. GM

## 2024-08-27 NOTE — Progress Notes (Signed)
 Called [facility] to [facility] for EUS. Practice information including address, phone number, fax number, and NPI has been sent via email to [employer] with [facility]. Awaiting confirmation of authorization being sent to [facility].

## 2024-08-28 ENCOUNTER — Inpatient Hospital Stay: Admitting: Oncology

## 2024-08-29 ENCOUNTER — Other Ambulatory Visit: Payer: Self-pay

## 2024-08-29 DIAGNOSIS — C169 Malignant neoplasm of stomach, unspecified: Secondary | ICD-10-CM

## 2024-08-29 NOTE — Progress Notes (Signed)
 EUS scheduled, pt instructed and medications reviewed.  Patient instructions mailed to home.  Patient to call with any questions or concerns.

## 2024-08-29 NOTE — Progress Notes (Signed)
 Left message on machine to call back

## 2024-08-29 NOTE — Progress Notes (Signed)
 EUS has been set up for 10/20 at [facility] with GM

## 2024-09-05 ENCOUNTER — Telehealth: Payer: Self-pay

## 2024-09-05 NOTE — Telephone Encounter (Signed)
-----   Message from [facility] sent at 09/05/2024  4:56 PM EDT ----- Regarding: Put in for EUS next week Javin Nong, Try to see if we can get this patient in for EUS next week, in the slot of the Dr. Darilyn patient who cancelled and did not reschedule. Thanks. GM

## 2024-09-06 ENCOUNTER — Ambulatory Visit (HOSPITAL_COMMUNITY)
Admission: RE | Admit: 2024-09-06 | Discharge: 2024-09-06 | Disposition: A | Discharge status: Home / Self Care | Attending: Gastroenterology | Admitting: Gastroenterology

## 2024-09-06 ENCOUNTER — Encounter (HOSPITAL_COMMUNITY): Payer: Self-pay | Admitting: Gastroenterology

## 2024-09-06 ENCOUNTER — Ambulatory Visit (HOSPITAL_COMMUNITY): Admitting: Anesthesiology

## 2024-09-06 ENCOUNTER — Other Ambulatory Visit: Payer: Self-pay

## 2024-09-06 ENCOUNTER — Encounter (HOSPITAL_COMMUNITY)
Admission: RE | Disposition: A | Discharge status: Home / Self Care | Payer: Self-pay | Source: Home / Self Care | Attending: Gastroenterology

## 2024-09-06 DIAGNOSIS — Z86711 Personal history of pulmonary embolism: Secondary | ICD-10-CM | POA: Insufficient documentation

## 2024-09-06 DIAGNOSIS — D128 Benign neoplasm of rectum: Secondary | ICD-10-CM

## 2024-09-06 DIAGNOSIS — C20 Malignant neoplasm of rectum: Secondary | ICD-10-CM

## 2024-09-06 DIAGNOSIS — Z86718 Personal history of other venous thrombosis and embolism: Secondary | ICD-10-CM | POA: Diagnosis not present

## 2024-09-06 DIAGNOSIS — C169 Malignant neoplasm of stomach, unspecified: Secondary | ICD-10-CM | POA: Insufficient documentation

## 2024-09-06 DIAGNOSIS — Z87891 Personal history of nicotine dependence: Secondary | ICD-10-CM | POA: Insufficient documentation

## 2024-09-06 DIAGNOSIS — I10 Essential (primary) hypertension: Secondary | ICD-10-CM | POA: Diagnosis not present

## 2024-09-06 DIAGNOSIS — Z7901 Long term (current) use of anticoagulants: Secondary | ICD-10-CM | POA: Diagnosis not present

## 2024-09-06 DIAGNOSIS — K6289 Other specified diseases of anus and rectum: Secondary | ICD-10-CM

## 2024-09-06 DIAGNOSIS — I679 Cerebrovascular disease, unspecified: Secondary | ICD-10-CM

## 2024-09-06 DIAGNOSIS — E119 Type 2 diabetes mellitus without complications: Secondary | ICD-10-CM | POA: Diagnosis not present

## 2024-09-06 HISTORY — PX: FLEXIBLE SIGMOIDOSCOPY: SHX5431

## 2024-09-06 SURGERY — SIGMOIDOSCOPY, FLEXIBLE
Anesthesia: General

## 2024-09-06 MED ORDER — PROPOFOL 10 MG/ML IV BOLUS
INTRAVENOUS | Status: DC | PRN
  Administered 2024-09-06: 30 mg via INTRAVENOUS
  Administered 2024-09-06: 70 mg via INTRAVENOUS
  Administered 2024-09-06: 30 mg via INTRAVENOUS
  Administered 2024-09-06 (×2): 10 mg via INTRAVENOUS
  Administered 2024-09-06: 30 mg via INTRAVENOUS
  Administered 2024-09-06: 20 mg via INTRAVENOUS
  Administered 2024-09-06: 10 mg via INTRAVENOUS
  Administered 2024-09-06 (×2): 30 mg via INTRAVENOUS

## 2024-09-06 MED ORDER — LIDOCAINE HCL (CARDIAC) PF 100 MG/5ML IV SOSY
PREFILLED_SYRINGE | INTRAVENOUS | Status: DC | PRN
  Administered 2024-09-06: 40 mg via INTRAVENOUS

## 2024-09-06 MED ORDER — PHENYLEPHRINE HCL (PRESSORS) 10 MG/ML IV SOLN
INTRAVENOUS | Status: DC | PRN
  Administered 2024-09-06 (×2): 100 ug via INTRAVENOUS

## 2024-09-06 MED ORDER — LACTATED RINGERS IV SOLN: INTRAVENOUS | Status: DC

## 2024-09-06 NOTE — Anesthesia Postprocedure Evaluation (Signed)
 Anesthesia Post Note  Patient: Dustin Chandler  Procedure(s) Performed: KINGSTON SIDE  Patient location during evaluation: Endoscopy Anesthesia Type: General Level of consciousness: awake Pain management: pain level controlled Vital Signs Assessment: post-procedure vital signs reviewed and stable Respiratory status: spontaneous breathing Cardiovascular status: stable Postop Assessment: no apparent nausea or vomiting Anesthetic complications: no   No notable events documented.   Last Vitals:  Vitals:   09/06/24 0721 09/06/24 0909  BP: (!) 143/73 114/68  Pulse: 75 62  Resp: 13 17  Temp: 36.6 C 36.4 C  SpO2: 99% 100%    Last Pain:  Vitals:   09/06/24 0909  TempSrc: Oral  PainSc: 0-No pain                 [name]

## 2024-09-06 NOTE — Transfer of Care (Signed)
 Immediate Anesthesia Transfer of Care Note  Patient: Dustin Chandler  Procedure(s) Performed: KINGSTON SIDE  Patient Location: PACU and Endoscopy Unit  Anesthesia Type:General  Level of Consciousness: awake  Airway & Oxygen Therapy: Patient Spontanous Breathing  Post-op Assessment: Report given to RN  Post vital signs: Reviewed  Last Vitals:  Vitals Value Taken Time  BP    Temp    Pulse    Resp    SpO2      Last Pain:  Vitals:   09/06/24 0721  TempSrc: Oral  PainSc: 0-No pain      Patients Stated Pain Goal: 3 (09/06/24 0721)  Complications: No notable events documented.

## 2024-09-06 NOTE — Anesthesia Preprocedure Evaluation (Signed)
 Anesthesia Evaluation  Patient identified by MRN, date of birth, ID band Patient awake    Reviewed: Allergy & Precautions, H&P , NPO status , Patient's Chart, lab work & pertinent test results, reviewed documented beta blocker date and time   Airway Mallampati: II  TM Distance: >3 FB Neck ROM: full    Dental no notable dental hx.    Pulmonary neg pulmonary ROS, former smoker   Pulmonary exam normal breath sounds clear to auscultation       Cardiovascular Exercise Tolerance: Good hypertension,  Rhythm:regular Rate:Normal     Neuro/Psych  PSYCHIATRIC DISORDERS Anxiety     CVA    GI/Hepatic negative GI ROS, Neg liver ROS,,,  Endo/Other  diabetes    Renal/GU negative Renal ROS  negative genitourinary   Musculoskeletal   Abdominal   Peds  Hematology  (+) Blood dyscrasia, anemia   Anesthesia Other Findings   Reproductive/Obstetrics negative OB ROS                              Anesthesia Physical Anesthesia Plan  ASA: 3  Anesthesia Plan: General   Post-op Pain Management:    Induction:   PONV Risk Score and Plan: Propofol  infusion  Airway Management Planned:   Additional Equipment:   Intra-op Plan:   Post-operative Plan:   Informed Consent: I have reviewed the patients History and Physical, chart, labs and discussed the procedure including the risks, benefits and alternatives for the proposed anesthesia with the patient or authorized representative who has indicated his/her understanding and acceptance.     Dental Advisory Given  Plan Discussed with: CRNA  Anesthesia Plan Comments:         Anesthesia Quick Evaluation

## 2024-09-06 NOTE — H&P (Signed)
 Primary Care Physician:  System, Provider Not In Primary Gastroenterologist:  Dr. Cinderella  Pre-Procedure History & Physical:  HPI:   89 year old male with history of CVA, DVT, PE on Eliquis  (last dose 06/30/2024) diabetes, hypertension with anemia here for evaluation of abnormal PET CT with abnormality in the rectum. Patient with  diagnosis of gastric cancer . recent colonoscopy with inadequate examination of the rectum given large amount of stool      PET/CT  IMPRESSION: Markedly hypermetabolic concentric soft tissue mass in gastric body, consistent with primary gastric carcinoma.   8 mm hypermetabolic lymph node along the inferior aspect of the gastric antrum, suspicious for metastatic disease.   8 mm subpleural nodule in the posterolateral left upper lobe shows mild FDG uptake, but is not hypermetabolic. This is indeterminate, and differential diagnosis includes granuloma, metastasis, and primary bronchogenic carcinoma.   2.8 cm hypermetabolic mass in the left lateral wall of the rectum, highly suspicious for primary rectal carcinoma. Recommend correlation with rectal exam and colonoscopy.   06/2024 Colonoscopy  A large amount of stool was found in the rectum, precluding visualization. Lavage of the area was performed using a large amount of sterile water, resulting in incomplete clearance with fair visualization.          Past Medical History:  Diagnosis Date   Colon polyps 2011   Non cancerous    CVA (cerebral vascular accident) (HCC) 10/01/2021   Diabetes mellitus    Diverticulosis    Elevated PSA 2010   dr. 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 Authorizing Provider  acetaminophen  (TYLENOL ) 325 MG tablet Take 2 tablets (650 mg total) by mouth every 6 (six) hours as needed for mild pain (pain score 1-3) or fever (or Fever >/= 101). 07/05/24  Yes Johnson, Clanford L, MD  atorvastatin  (LIPITOR) 40 MG tablet Take 1 tablet (40 mg total) by mouth every evening. 10/03/21  Yes Lama, Sabas RAMAN, MD  bisacodyl  (DULCOLAX) 5 MG EC tablet Take 1 tablet (5 mg total) by mouth daily as needed for moderate constipation. 07/05/24  Yes Johnson, Clanford L, MD  cetirizine  (ZYRTEC ) 10 MG tablet Take 10 mg by mouth daily as needed for allergies. 04/01/15  Yes [provider]  Cholecalciferol (VITAMIN D3) LIQD Take 1 drop by mouth daily.   Yes [provider]  cloNIDine  (CATAPRES ) 0.1 MG tablet Take 0.1 mg by mouth daily.   Yes [provider]  diphenhydrAMINE  (BENADRYL ) 25 MG tablet Take 25 mg by mouth at bedtime.   Yes [provider]  guaiFENesin  (MUCINEX ) 600 MG 12 hr tablet Take 1 tablet (600 mg total) by mouth 2 (two) times daily as needed. Patient taking differently: Take 600 mg by mouth 2 (two) times daily as needed for cough or to loosen phlegm.  05/05/24  Yes Stuart Vernell Norris, PA-C  thiamine  (VITAMIN B-1) 100 MG tablet Take 100 mg by mouth daily.   Yes [provider]  vitamin B-12 (CYANOCOBALAMIN) 1000 MCG tablet Take 1 tablet (1,000 mcg total) by mouth daily. 10/03/21  Yes Drusilla Sabas RAMAN, MD  hydrALAZINE  (APRESOLINE ) 25 MG tablet Take 1 tablet (25 mg total) by mouth in the morning and at bedtime. 10/03/21 08/23/24  Drusilla Sabas RAMAN, MD    Allergies as of 08/20/2024 - Review Complete  08/15/2024  Allergen Reaction Noted   Lisinopril Anaphylaxis and Swelling 12/07/2019   Monoferric  [ferric derisomaltose ] Other (See Comments) 07/30/2024   Amlodipine  Other (See Comments) 01/12/2016   Fish allergy Itching 06/30/2024   Peanut-containing drug products Itching 06/30/2024   Penicillin g Other (See Comments) 02/10/2016   Penicillins Other (See Comments) 07/18/2008   Tomato Itching 06/30/2024    Family History  Problem Relation Age of Onset   Heart failure Mother    Pneumonia Father    Coronary artery disease Brother        bowel obstruction   Pneumonia Sister    Stroke Sister    Aneurysm Sister     Social History   Socioeconomic History   Marital status: Married    Spouse name: Not on file   Number of children: 4   Years of education: Not on file   Highest education level: Not on file  Occupational History   Occupation: Employed   Tobacco Use   Smoking status: Former    Types: Cigarettes   Smokeless tobacco: Never  Vaping Use   Vaping status: Never Used  Substance and Sexual Activity   Alcohol use: No   Drug use: No   Sexual activity: Not Currently    Birth control/protection: None  Other Topics Concern   Not on file  Social History Narrative   Not on file   Social Drivers of Health   Financial Resource Strain: Not on file  Food Insecurity: No Food Insecurity (08/29/2024)   Received from [facility]   Hunger Vital Sign    Within the past 12 months, you worried that your food would run out before you got the money to buy more.: Never true    Within the past 12 months, the food you bought just didn't last and you didn't have money to get more.: Never true  Recent Concern: Food Insecurity - Food Insecurity Present (07/24/2024)   Hunger Vital Sign    Worried About Running Out of Food in the Last Year: Never true    Ran Out of Food in the Last Year: Sometimes true  Transportation Needs: No Transportation Needs (08/29/2024)   Received from [facility]   PRAPARE - Transportation    Lack of Transportation (Medical): No    Lack of Transportation (Non-Medical): No  Physical Activity: Not on file  Stress: Not on file  Social Connections: Socially Integrated (06/30/2024)   Social Connection and Isolation Panel    Frequency of Communication with Friends and Family: More than three times a week    Frequency of Social Gatherings with Friends and Family: More than three times a week    Attends Religious Services: More than 4 times per year    Active Member of [employer] or Organizations: Yes    Attends Banker Meetings: More than 4 times per year    Marital Status: Married  Catering manager Violence: Not At Risk (07/24/2024)   Humiliation, Afraid, Rape, and  Kick questionnaire    Fear of Current or Ex-Partner: No    Emotionally Abused: No    Physically Abused: No    Sexually Abused: No    Review of Systems: See HPI, otherwise negative ROS  Physical Exam: Vital signs in last 24 hours: Temp:  [97.9 F (36.6 C)] 97.9 F (36.6 C) (09/19 0721) Pulse Rate:  [75] 75 (09/19 0721) Resp:  [13] 13 (09/19 0721) BP: (143)/(73) 143/73 (09/19 0721) SpO2:  [99 %] 99 % (09/19 0721) Weight:  [72.6 kg] 72.6 kg (09/19 0721)   General:   Alert,  Well-developed, well-nourished, pleasant and cooperative in NAD Head:  Normocephalic and atraumatic. Eyes:  Sclera clear, no icterus.   Conjunctiva pink. Ears:  Normal auditory acuity. Nose:  No deformity, discharge,  or lesions. Msk:  Symmetrical without gross deformities. Normal posture. Extremities:  Without clubbing or edema. Neurologic:  Alert and  oriented x4;  grossly normal neurologically. Skin:  Intact without significant lesions or rashes. Psych:  Alert and cooperative. Normal mood and affect.  Impression/Plan: 89 year old male with history of CVA, DVT, PE on Eliquis  (last dose 06/30/2024) diabetes, hypertension with anemia here for evaluation of abnormal PET CT with abnormality in the  rectum. Patient with  diagnosis of gastric cancer . recent colonoscopy with inadequate examination of the rectum given large amount of stool   Proceed with Flexible Sigmoidoscopy   The risks of the procedure including infection, bleed, or perforation as well as benefits, limitations, alternatives and imponderables have been reviewed with the patient. Questions have been answered. All parties agreeable.

## 2024-09-06 NOTE — Telephone Encounter (Signed)
Patient daughter returning call  °

## 2024-09-06 NOTE — Telephone Encounter (Signed)
 Appt has been made for 09/10/24 at 1045 am at [facility] with GM

## 2024-09-06 NOTE — Op Note (Signed)
 [facility] Patient Name: Dustin Chandler Procedure Date: 09/06/2024 7:53 AM MRN: [id number] Date of Birth: May 29, 1934 Attending MD: Deatrice Dine , MD, [id number] CSN: [id number] Age: 89 Admit Type: Outpatient Procedure:                Flexible Sigmoidoscopy Indications:               Providers:                Deatrice Dine, MD, Olam Ada, RN, Jon Loge Referring MD:              Medicines:                Monitored Anesthesia Care Complications:            No immediate complications. Estimated Blood Loss:     Estimated blood loss was minimal. Procedure:                After obtaining informed consent, the scope was                            passed under direct vision. The [id number]-[id number]                            ([id number]) Peds Colon was introduced through the                            anus and advanced to the the left transverse colon.                            The flexible sigmoidoscopy was technically                            difficult and complex due to poor bowel prep.                            Successful completion of the procedure was aided by                            lavage. The patient tolerated the procedure well. Scope In: 8:15:50 AM Scope Out: 8:59:53 AM Total Procedure Duration: 0 hours 44 minutes 3 seconds  Findings:      The digital rectal exam revealed a 25mm firm rectal mass palpated 3.0 cm       from the anal verge.      A 25 mm polyp was found in the rectum. The polyp was semi-sessile.       Preparations were made for mucosal resection. Demarcation of the lesion       was performed with high-definition white light and narrow band imaging       to clearly identify the boundaries of the lesion. Eleview was injected       to raise the lesion 10mL. Piecemeal mucosal resection using a cold snare       was performed. Resection was incomplete, and the resected tissue was       partially retrieved. Area was successfully injected with 3 mL PURASTAT        for hemostasis. Impression:               -  Large amount of stool in rectum, attempted to                            lavage it/manual removal and remove stool using                            RothNet                           - One 25 mm polyp/mass in the rectum, attempted to                            remove with mucosal resection. Polyp resection was                            incomplete, and the resected tissue was partially                            retrieved. Injected. Incomplete resection of the                            lesion because of poor prep and inadeqaute lift .                            R/o Malignancy                           - Mucosal resection was performed. Resection was                            incomplete, and the resected tissue was partially                            retrieved. Moderate Sedation:      Per Anesthesia Care Recommendation:           - Patient has a contact number available for                            emergencies. The signs and symptoms of potential                            delayed complications were discussed with the                            patient. Return to normal activities tomorrow.                            Written discharge instructions were provided to the                            patient.                           - Resume previous diet.                           -  Follow up path, if this is not malignant; would                            recommend repeat procedure with extended bowel prep                            for removal of the lesion Procedure Code(s):        --- Professional ---                           [phone], Sigmoidoscopy, flexible; with endoscopic                            mucosal resection Diagnosis Code(s):        --- Professional ---                           K62.89, Other specified diseases of anus and rectum                           D12.8, Benign neoplasm of rectum CPT copyright 2022 American Medical  Association. All rights reserved. The codes documented in this report are preliminary and upon coder review may  be revised to meet current compliance requirements. Deatrice Dine, MD Deatrice Dine, MD 09/06/2024 9:10:04 AM This report has been signed electronically. Number of Addenda: 0

## 2024-09-06 NOTE — Telephone Encounter (Signed)
 Left message on machine to call back

## 2024-09-06 NOTE — Discharge Instructions (Signed)

## 2024-09-08 DIAGNOSIS — K6289 Other specified diseases of anus and rectum: Secondary | ICD-10-CM

## 2024-09-09 ENCOUNTER — Telehealth (INDEPENDENT_AMBULATORY_CARE_PROVIDER_SITE_OTHER): Payer: Self-pay | Admitting: Gastroenterology

## 2024-09-09 ENCOUNTER — Encounter (HOSPITAL_COMMUNITY): Payer: Self-pay | Admitting: Gastroenterology

## 2024-09-09 LAB — SURGICAL PATHOLOGY

## 2024-09-09 NOTE — Progress Notes (Signed)
 Attempted to obtain medical history for pre op call via telephone, unable to reach at this time. HIPAA compliant voicemail message left requesting return call to pre surgical testing department.

## 2024-09-09 NOTE — Telephone Encounter (Signed)
 EUS scheduled, pt instructed and medications reviewed.  Patient instructions mailed to home.  Patient to call with any questions or concerns.

## 2024-09-09 NOTE — Telephone Encounter (Signed)
 Pt daughter left voicemail in regards to getting FMLA papers completed for herself since pt was in the hospital back in July. Pt daughter Sharlet called on 08/22/24 and I advised she would have to get his PCP to complete FMLA since we only do FMLA for certain conditions. Pt daughter states that Dr.Mansouraty informed her to get pt GI doctor in [place] to complete paperwork. Pt has not been seen in office.  Please advise. Thank you  Sharlet Floras [phone]

## 2024-09-10 ENCOUNTER — Ambulatory Visit (HOSPITAL_COMMUNITY): Admitting: Registered Nurse

## 2024-09-10 ENCOUNTER — Encounter (HOSPITAL_COMMUNITY): Payer: Self-pay | Admitting: Gastroenterology

## 2024-09-10 ENCOUNTER — Other Ambulatory Visit: Payer: Self-pay

## 2024-09-10 ENCOUNTER — Encounter (HOSPITAL_COMMUNITY)
Admission: RE | Disposition: A | Discharge status: Home / Self Care | Payer: Self-pay | Source: Home / Self Care | Attending: Gastroenterology

## 2024-09-10 ENCOUNTER — Ambulatory Visit (HOSPITAL_COMMUNITY)
Admission: RE | Admit: 2024-09-10 | Discharge: 2024-09-10 | Disposition: A | Discharge status: Home / Self Care | Attending: Gastroenterology | Admitting: Gastroenterology

## 2024-09-10 ENCOUNTER — Telehealth (INDEPENDENT_AMBULATORY_CARE_PROVIDER_SITE_OTHER): Payer: Self-pay | Admitting: Gastroenterology

## 2024-09-10 DIAGNOSIS — K2289 Other specified disease of esophagus: Secondary | ICD-10-CM | POA: Diagnosis not present

## 2024-09-10 DIAGNOSIS — I10 Essential (primary) hypertension: Secondary | ICD-10-CM

## 2024-09-10 DIAGNOSIS — T182XXA Foreign body in stomach, initial encounter: Secondary | ICD-10-CM

## 2024-09-10 DIAGNOSIS — I679 Cerebrovascular disease, unspecified: Secondary | ICD-10-CM | POA: Diagnosis not present

## 2024-09-10 DIAGNOSIS — Z8673 Personal history of transient ischemic attack (TIA), and cerebral infarction without residual deficits: Secondary | ICD-10-CM | POA: Diagnosis not present

## 2024-09-10 DIAGNOSIS — I899 Noninfective disorder of lymphatic vessels and lymph nodes, unspecified: Secondary | ICD-10-CM

## 2024-09-10 DIAGNOSIS — C168 Malignant neoplasm of overlapping sites of stomach: Secondary | ICD-10-CM | POA: Diagnosis present

## 2024-09-10 DIAGNOSIS — E119 Type 2 diabetes mellitus without complications: Secondary | ICD-10-CM | POA: Diagnosis not present

## 2024-09-10 DIAGNOSIS — C169 Malignant neoplasm of stomach, unspecified: Secondary | ICD-10-CM

## 2024-09-10 DIAGNOSIS — Z87891 Personal history of nicotine dependence: Secondary | ICD-10-CM | POA: Diagnosis not present

## 2024-09-10 HISTORY — PX: ESOPHAGOGASTRODUODENOSCOPY: SHX5428

## 2024-09-10 HISTORY — PX: EUS: SHX5427

## 2024-09-10 SURGERY — ULTRASOUND, UPPER GI TRACT, ENDOSCOPIC
Anesthesia: Monitor Anesthesia Care

## 2024-09-10 MED ORDER — PROPOFOL 1000 MG/100ML IV EMUL
INTRAVENOUS | Status: AC
  Filled 2024-09-10: qty 100

## 2024-09-10 MED ORDER — PANTOPRAZOLE SODIUM 40 MG PO TBEC: 40.0000 mg | DELAYED_RELEASE_TABLET | Freq: Two times a day (BID) | ORAL | 11 refills | Status: DC

## 2024-09-10 MED ORDER — PROPOFOL 500 MG/50ML IV EMUL
INTRAVENOUS | Status: DC | PRN
  Administered 2024-09-10: 140 ug/kg/min via INTRAVENOUS
  Administered 2024-09-10: 10 mg via INTRAVENOUS

## 2024-09-10 MED ORDER — SUCRALFATE 1 GM/10ML PO SUSP: 1.0000 g | Freq: Two times a day (BID) | ORAL | 1 refills | Status: DC

## 2024-09-10 MED ORDER — SODIUM CHLORIDE 0.9 % IV SOLN: INTRAVENOUS | Status: DC

## 2024-09-10 NOTE — Anesthesia Procedure Notes (Signed)
 Procedure Name: MAC Date/Time: 09/10/2024 10:43 AM  Performed by: Memory Armida LABOR, CRNAPre-anesthesia Checklist: Patient identified, Emergency Drugs available, Suction available, Patient being monitored and Timeout performed Patient Re-evaluated:Patient Re-evaluated prior to induction Oxygen Delivery Method: Simple face mask Placement Confirmation: positive ETCO2 Dental Injury: Teeth and Oropharynx as per pre-operative assessment

## 2024-09-10 NOTE — Anesthesia Preprocedure Evaluation (Addendum)
 Anesthesia Evaluation  Patient identified by MRN, date of birth, ID band Patient awake    Reviewed: Allergy & Precautions, NPO status , Patient's Chart, lab work & pertinent test results  Airway Mallampati: II  TM Distance: >3 FB Neck ROM: Full    Dental  (+) Dental Advisory Given, Partial Upper, Partial Lower   Pulmonary former smoker   Pulmonary exam normal breath sounds clear to auscultation       Cardiovascular hypertension, Pt. on medications Normal cardiovascular exam Rhythm:Regular Rate:Normal     Neuro/Psych  PSYCHIATRIC DISORDERS Anxiety     CVA    GI/Hepatic Neg liver ROS,,,Gastric adenocarcinoma   Endo/Other  diabetes, Type 2    Renal/GU negative Renal ROS     Musculoskeletal negative musculoskeletal ROS (+)    Abdominal   Peds  Hematology negative hematology ROS (+)   Anesthesia Other Findings Day of surgery medications reviewed with the patient.  Reproductive/Obstetrics                              Anesthesia Physical Anesthesia Plan  ASA: 3  Anesthesia Plan: MAC   Post-op Pain Management: Minimal or no pain anticipated   Induction: Intravenous  PONV Risk Score and Plan: 1 and TIVA and Treatment may vary due to age or medical condition  Airway Management Planned: Simple Face Mask and Natural Airway  Additional Equipment:   Intra-op Plan:   Post-operative Plan:   Informed Consent: I have reviewed the patients History and Physical, chart, labs and discussed the procedure including the risks, benefits and alternatives for the proposed anesthesia with the patient or authorized representative who has indicated his/her understanding and acceptance.     Dental advisory given  Plan Discussed with: CRNA  Anesthesia Plan Comments:          Anesthesia Quick Evaluation

## 2024-09-10 NOTE — Transfer of Care (Signed)
 Immediate Anesthesia Transfer of Care Note  Patient: Dustin Chandler  Procedure(s) Performed: ULTRASOUND, UPPER GI TRACT, ENDOSCOPIC  Patient Location: PACU  Anesthesia Type:MAC  Level of Consciousness: drowsy and patient cooperative  Airway & Oxygen Therapy: Patient Spontanous Breathing and Patient connected to face mask oxygen  Post-op Assessment: Report given to RN and Post -op Vital signs reviewed and stable  Post vital signs: Reviewed and stable  Last Vitals:  Vitals Value Taken Time  BP 115/64 09/10/24 11:30  Temp    Pulse 60 09/10/24 11:30  Resp 19 09/10/24 11:30  SpO2 100 % 09/10/24 11:30  Vitals shown include unfiled device data.  Last Pain:  Vitals:   09/10/24 0921  TempSrc: Temporal  PainSc: 0-No pain         Complications: No notable events documented.

## 2024-09-10 NOTE — Discharge Instructions (Signed)

## 2024-09-10 NOTE — H&P (Signed)
 GASTROENTEROLOGY PROCEDURE H&P NOTE   Primary Care Physician: System, Provider Not In  HPI: Dustin Chandler is a 89 y.o. male who presents for EGD/EUS to evaluate gastric cancer for staging purposes.  Past Medical History:  Diagnosis Date   Colon polyps 2011   Non cancerous    CVA (cerebral vascular accident) (HCC) 10/01/2021   Diabetes mellitus    Diverticulosis    Elevated PSA 2010   dr. 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   Past Surgical History:  Procedure Laterality Date   COLONOSCOPY N/A 07/04/2024   Procedure: COLONOSCOPY;  Surgeon: Cinderella Deatrice FALCON, MD;  [facility]: [facility];  Service: Endoscopy;  Laterality: N/A;   CYSTOSTOMY W/ BLADDER BIOPSY     ESOPHAGOGASTRODUODENOSCOPY N/A 07/04/2024   Procedure: EGD (ESOPHAGOGASTRODUODENOSCOPY);  Surgeon: Cinderella Deatrice FALCON, MD;  Location: [facility];  Service: Endoscopy;  Laterality: N/A;   FLEXIBLE SIGMOIDOSCOPY N/A 09/06/2024   Procedure: KINGSTON SIDE;  Surgeon: Cinderella Deatrice FALCON, MD;  Location: [facility];  Service: Endoscopy;  Laterality: N/A;  8:00am, ASA 1-2   removal of benign mole     15 years ago   No current facility-administered medications for this encounter.   No current facility-administered medications for this encounter. Allergies  Allergen Reactions   Lisinopril Anaphylaxis and Swelling    Angioedema of eyelids and lips   Monoferric  [Ferric Derisomaltose ] Other (See Comments)    Patient complained of pressure in his chest.   See progress note from 07/30/2024.   Unable to complete infusion.   Feraheme [Ferumoxytol ] Nausea And Vomiting and Other (See Comments)    Patient experienced nausea, vomiting, chest pain and pressure. See progress note from 08/23/2024.   Amlodipine  Other (See Comments)    Edema of lower extremity, Swollen ankle region   Fish Allergy Itching    Bone-in fish    Peanut-Containing Drug Products Itching   Penicillin G Other (See Comments)    Joint pain   Penicillins Other (See Comments)    Locked my knee up   Tomato Itching    Raw tomatoes   Family History  Problem Relation Age of Onset   Heart failure Mother    Pneumonia Father    Coronary artery disease Brother        bowel obstruction   Pneumonia Sister    Stroke Sister    Aneurysm Sister    Social History   Socioeconomic History   Marital status: Married    Spouse name: Not on file   Number of children: 4   Years of education: Not on file   Highest education level: Not on file  Occupational History   Occupation: Employed   Tobacco Use   Smoking status: Former    Types: Cigarettes   Smokeless tobacco: Never  Vaping Use   Vaping status: Never Used  Substance and Sexual Activity   Alcohol use: No   Drug use: No   Sexual activity: Not Currently    Birth control/protection: None  Other Topics Concern   Not on file  Social History Narrative   Not on file   Social Drivers of Health   Financial Resource Strain: Not on file  Food Insecurity: No Food Insecurity (08/29/2024)   Received from [facility]   Hunger Vital Sign    Within the past 12 months, you worried that  your food would run out before you got the money to buy more.: Never true    Within the past 12 months, the food you bought just didn't last and you didn't have money to get more.: Never true  Recent Concern: Food Insecurity - Food Insecurity Present (07/24/2024)   Hunger Vital Sign    Worried About Running Out of Food in the Last Year: Never true    Ran Out of Food in the Last Year: Sometimes true  Transportation Needs: No Transportation Needs (08/29/2024)   [facility]   PRAPARE - Transportation    Lack of Transportation (Medical): No    Lack of Transportation (Non-Medical): No  Physical Activity: Not on file  Stress: Not on file  Social Connections: Socially Integrated (06/30/2024)    Social Connection and Isolation Panel    Frequency of Communication with Friends and Family: More than three times a week    Frequency of Social Gatherings with Friends and Family: More than three times a week    Attends Religious Services: More than 4 times per year    Active Member of [employer] or Organizations: Yes    Attends Banker Meetings: More than 4 times per year    Marital Status: Married  Catering manager Violence: Not At Risk (07/24/2024)   Humiliation, Afraid, Rape, and Kick questionnaire    Fear of Current or Ex-Partner: No    Emotionally Abused: No    Physically Abused: No    Sexually Abused: No    Physical Exam: Today's Vitals   09/10/24 0921  BP: (!) 176/93  Pulse: 71  Resp: 19  Temp: 97.7 F (36.5 C)  TempSrc: Temporal  SpO2: 99%  Weight: 77.1 kg  Height: 6' 1 (1.854 m)  PainSc: 0-No pain   Body mass index is 22.43 kg/m. GEN: NAD EYE: Sclerae anicteric ENT: MMM CV: Non-tachycardic GI: Soft, NT/ND NEURO:  Alert & Oriented x 3  Lab Results: No results for input(s): WBC, HGB, HCT, PLT in the last 72 hours. BMET No results for input(s): NA, K, CL, CO2, GLUCOSE, BUN, CREATININE, CALCIUM  in the last 72 hours. LFT No results for input(s): PROT, ALBUMIN, AST, ALT, ALKPHOS, BILITOT, BILIDIR, IBILI in the last 72 hours. PT/INR No results for input(s): LABPROT, INR in the last 72 hours.   Impression / Plan: This is a 89 y.o.male who presents for EGD/EUS to evaluate gastric cancer for staging purposes.  The risks and benefits of endoscopic evaluation/treatment were discussed with the patient and/or family; these include but are not limited to the risk of perforation, infection, bleeding, missed lesions, lack of diagnosis, severe illness requiring hospitalization, as well as anesthesia and sedation related illnesses.  The patient's history has been reviewed, patient examined, no change in status, and  deemed stable for procedure.  The patient and/or family is agreeable to proceed.    Aloha Finner, MD [facility] Office # [phone]

## 2024-09-10 NOTE — Telephone Encounter (Signed)
 Daughter Sharlet Floras left voicemail that she received a call from Dr.Ahmed about pt procedure results. Sharlet states she can be contacted at [phone]. Pt also inquired about FMLA papers that she is needing since pt was in hospital in July and is needing our fax number. Would we be the ones to fill out the FMLA paper work? Please advise. Thank you!

## 2024-09-10 NOTE — Anesthesia Postprocedure Evaluation (Signed)
 Anesthesia Post Note  Patient: Dustin Chandler  Procedure(s) Performed: ULTRASOUND, UPPER GI TRACT, ENDOSCOPIC     Patient location during evaluation: Endoscopy Anesthesia Type: MAC Level of consciousness: oriented, awake and alert and awake Pain management: pain level controlled Vital Signs Assessment: post-procedure vital signs reviewed and stable Respiratory status: spontaneous breathing, nonlabored ventilation, respiratory function stable and patient connected to nasal cannula oxygen Cardiovascular status: blood pressure returned to baseline and stable Postop Assessment: no headache, no backache and no apparent nausea or vomiting Anesthetic complications: no   No notable events documented.  Last Vitals:  Vitals:   09/10/24 1140 09/10/24 1150  BP: 130/69 (!) 153/80  Pulse: 64 (!) 59  Resp: (!) 23 20  Temp:    SpO2: 100% 100%    Last Pain:  Vitals:   09/10/24 1150  TempSrc:   PainSc: 0-No pain                 Garnette FORBES Skillern

## 2024-09-10 NOTE — Op Note (Signed)
 [facility] Patient Name: Dustin Chandler Procedure Date: 09/10/2024 MRN: [id number] Attending MD: Aloha Finner , MD, [id number] Date of Birth: 09/05/1934 CSN: [id number] Age: 89 Admit Type: Outpatient Procedure:                Upper EUS Indications:              Abnormal abdominal PET scan, Pre-treatment staging                            of gastric adenocarcinoma Providers:                Aloha Finner, MD, Darleene Bare, RN, Haskel Chris, Technician Referring MD:              Medicines:                Monitored Anesthesia Care Complications:            No immediate complications. Estimated Blood Loss:     Estimated blood loss was minimal. Procedure:                Pre-Anesthesia Assessment:                           - Prior to the procedure, a History and Physical                            was performed, and patient medications and                            allergies were reviewed. The patient's tolerance of                            previous anesthesia was also reviewed. The risks                            and benefits of the procedure and the sedation                            options and risks were discussed with the patient.                            All questions were answered, and informed consent                            was obtained. Prior Anticoagulants: The patient has                            taken no anticoagulant or antiplatelet agents. ASA                            Grade Assessment: III - A patient with severe  systemic disease. After reviewing the risks and                            benefits, the patient was deemed in satisfactory                            condition to undergo the procedure.                           After obtaining informed consent, the endoscope was                            passed under direct vision. Throughout the                            procedure,  the patient's blood pressure, pulse, and                            oxygen saturations were monitored continuously. The                            [id number]-[id number] ([id number]) Olympus endoscope was introduced                            through the mouth, and advanced to the second part                            of duodenum. The RADIAL EUS ( [id number] ) [id number] was                            introduced through the mouth, and advanced to the                            stomach for ultrasound examination from the                            esophagus and stomach. The upper EUS was                            accomplished without difficulty. The patient                            tolerated the procedure. Scope In: Scope Out: Findings:      ENDOSCOPIC FINDING: :      No gross lesions were noted in the entire esophagus.      The Z-line was irregular and was found 40 cm from the incisors.      An endoclip was found in the cardia.      A large, fungating, infiltrative and ulcerated, partially       circumferential (involving two-thirds of the lumen circumference) mass       with oozing bleeding and stigmata of recent bleeding was found in the       distal gastric body extending over the incisura and antrum and       prepyloric  region and onto the superior aspect of the pylorus with       extension into the duodenum. Biopsies were taken with a cold forceps for       histology purposes in case additional mutational analysis is needed       (this is already a known malignancy).      No other gross lesions were noted in the duodenal sweep and in the       second portion of the duodenum.      ENDOSONOGRAPHIC FINDING: :      A hypoechoic lobulated mass was identified endosonographically was found       in the distal gastric body extending over the incisura and antrum and       prepyloric region and onto the superior aspect of the pylorus with       extension into the duodenum. The mass begins at 11 cm from the        gastroesophageal junction and measured at least 40 mm by 20 mm in       maximal cross-sectional diameter. The endosonographic borders were       poorly-defined. There was sonographic evidence suggesting invasion into       and through the muscularis propria (Layer 4).      One malignant-appearing lymph node was visualized in the gastrohepatic       ligament (level 18). It measured 13 mm by 10 mm in maximal       cross-sectional diameter. The node was round, hypoechoic and had well       defined margins.      Endosonographic imaging in the visualized portion of the liver showed no       mass.      The celiac region was visualized. Impression:               EGD impression:                           - No gross lesions in the entire esophagus. Z-line                            irregular, 40 cm from the incisors.                           - An endoclip was found in the stomach.                           - Malignant gastric tumor at the body extending                            over the incisura and antrum and prepylorus into                            the superior aspect of the pylorus and duodenal                            bulb. Biopsied.                           - No gross lesions in the duodenal sweep and in the  second portion of the duodenum.                           EUS impression:                           - A mass was found in the distal gastric body                            extending over the incisura and antrum and                            prepyloric region and onto the superior aspect of                            the pylorus with extension into the duodenum. A                            tissue diagnosis was obtained prior to this exam.                            This is consistent with adenocarcinoma. This was                            staged T3 N1 Mx by endosonographic criteria.                           - One malignant-appearing lymph node  was visualized                            in the gastrohepatic ligament (level 18). Tissue                            has not been obtained. However, the endosonographic                            appearance is highly suspicious for metastatic                            gastric adenocarcinoma. Moderate Sedation:      Not Applicable - Patient had care per Anesthesia. Recommendation:           - The patient will be observed post-procedure,                            until all discharge criteria are met.                           - Discharge patient to home.                           - Patient has a contact number available for                            emergencies. The  signs and symptoms of potential                            delayed complications were discussed with the                            patient. Return to normal activities tomorrow.                            Written discharge instructions were provided to the                            patient.                           - Resume previous diet.                           - Observe patient's clinical course.                           - Follow-up with oncology and potentially radiation                            oncology in regards to next steps in treatment.                            Active ongoing oozing noted, suspect patient will                            continue to have issues of anemia if treatment is                            not initiated.                           - Follow-up pathology that was obtained, in case                            additional immuno typing or next generation                            sequencing is necessary for oncology team.                           - Initiate PPI twice daily.                           - Carafate  liquid 1 g twice daily.                           - Minimize nonsteroidal medications as able.                           - Continue present medications otherwise.                            -  The findings and recommendations were discussed                            with the patient.                           - The findings and recommendations were discussed                            with the patient's family. Procedure Code(s):        --- Professional ---                           [phone], Esophagogastroduodenoscopy, flexible,                            transoral; with endoscopic ultrasound examination                            limited to the esophagus, stomach or duodenum, and                            adjacent structures                           43239, Esophagogastroduodenoscopy, flexible,                            transoral; with biopsy, single or multiple Diagnosis Code(s):        --- Professional ---                           K22.89, Other specified disease of esophagus                           C16.4, Malignant neoplasm of pylorus                           C16.2, Malignant neoplasm of body of stomach                           C16.8, Malignant neoplasm of overlapping sites of                            stomach                           C16.3, Malignant neoplasm of pyloric antrum                           K31.89, Other diseases of stomach and duodenum                           I89.9, Noninfective disorder of lymphatic vessels                            and lymph nodes, unspecified  R93.5, Abnormal findings on diagnostic imaging of                            other abdominal regions, including retroperitoneum CPT copyright 2022 [employer]. All rights reserved. The codes documented in this report are preliminary and upon coder review may  be revised to meet current compliance requirements. Aloha Finner, MD 09/10/2024 11:35:37 AM Number of Addenda: 0

## 2024-09-11 ENCOUNTER — Encounter (HOSPITAL_COMMUNITY): Payer: Self-pay | Admitting: Gastroenterology

## 2024-09-12 ENCOUNTER — Ambulatory Visit (INDEPENDENT_AMBULATORY_CARE_PROVIDER_SITE_OTHER): Payer: Self-pay | Admitting: Gastroenterology

## 2024-09-12 MED ORDER — PANTOPRAZOLE SODIUM 40 MG PO TBEC: 40.0000 mg | DELAYED_RELEASE_TABLET | Freq: Two times a day (BID) | ORAL | 11 refills | Status: DC

## 2024-09-12 NOTE — Telephone Encounter (Signed)
 I spoke to [place] over the phone, We usually do FMLA for patient himself , but since this has been very tough time for the family members I am willing to [facility] for Dustin Chandler only for the duration her father was admitted in the hospital . She will be faxing in papers today , let me know when I can sign it . Thanks

## 2024-09-12 NOTE — Progress Notes (Signed)
 Patient had flexible sigmoidoscopy for positive PET-CT found to have rectal lesion , path back as TVA with HGD/intramucosal adenocarcinoma  I spoke with patient daughter Delphine glatter over the phone in detail that given poor prep only part of the lesion was removed .   Given positive PET-CT and inadequate lift of the lesion from submucosal space I can not rule out deep invasion of the adenocarcinoma into submucosa . Due to advance age , advance gastric cancer , this may require multidisciplinary discussion for management . May obtain MRI PELVIS  . Patient to follow up with oncology   A. 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   ===========  Dr Davonna lips . Thanks for your help always

## 2024-09-12 NOTE — Telephone Encounter (Signed)
 Inbound all from patient daughter requesting Protonix  prescription be sent to the [facility] pharmacy. Please advise.

## 2024-09-12 NOTE — Telephone Encounter (Signed)
 Prescription for Pantoprazole  sent to [facility] pharmacy in [place]

## 2024-09-12 NOTE — Addendum Note (Signed)
 Addended by: SUELLEN PEERS on: 09/12/2024 03:43 PM   Modules accepted: Orders

## 2024-09-12 NOTE — Telephone Encounter (Signed)
 Fax came through in regards to [facility]. Forms placed on provider desk

## 2024-09-12 NOTE — Telephone Encounter (Signed)
 Noted

## 2024-09-13 ENCOUNTER — Telehealth: Payer: Self-pay

## 2024-09-13 MED ORDER — PANTOPRAZOLE SODIUM 40 MG PO TBEC: 40.0000 mg | DELAYED_RELEASE_TABLET | Freq: Two times a day (BID) | ORAL | 11 refills | Status: DC

## 2024-09-13 NOTE — Telephone Encounter (Signed)
 Recd fax from [facility] pharmacy requesting that prescription for Pantoprazole  be sent to [facility] fax# [phone]. Prescription has been faxed.

## 2024-09-16 ENCOUNTER — Ambulatory Visit: Payer: Self-pay | Admitting: Gastroenterology

## 2024-09-16 LAB — SURGICAL PATHOLOGY

## 2024-09-16 NOTE — Telephone Encounter (Signed)
 FMLA papers completed by Dr.Ahmed. FMLA papers faxed to [phone] and [phone] (both numbers which were on FMLA papers, contacted daughter Sharlet and she asked that we fax it to both numbers) Advised Sharlet that FMLA papers will be faxed.

## 2024-09-16 NOTE — Telephone Encounter (Signed)
 To:               Recipient at [phone] Subject:          Secure-FMLA Result:           The transmission failed. Explanation:      Lost Communication with called fax machine Pages Sent:       0 Connect Time:     0 minutes, 24 seconds Transmit Time:    09/16/2024 11:25 Transfer Rate:    2400 Status Code:      0001 Retry Count:      8 Job Id:           [id number] Unique Id:        [id number] Fax Line:         17 Fax Server:       [id number]  Fax went through to [phone] but did not go through to above number after 2 tries

## 2024-09-16 NOTE — Telephone Encounter (Signed)
 Signed for 7/13-7/18. Please fax it fax

## 2024-09-18 NOTE — Progress Notes (Signed)
 Patient Care Team: System, Provider Not In as PCP - General Davonna Siad, MD as Medical Oncologist (Medical Oncology) Celestia Joesph SQUIBB, RN as Oncology Nurse Navigator (Medical Oncology)  Clinic Day:  08/15/2024  Referring physician: Vicci Afton CROME, MD   CHIEF COMPLAINT:  CC: Gastric adenocarcinoma    ASSESSMENT & PLAN:   Assessment & Plan: Dustin Chandler  is a 89 y.o. male with gastric adenocarcinoma.  Assessment and Plan Assessment & Plan Gastric adenocarcinoma [id number] by EUS.   biopsy-proven gastric adenocarcinoma CT scan with no evidence of metastasis PET scan: No evidence of metastasis consistent with local disease.  Also had rectal uptake concerning for rectal carcinoma. 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  Patient had a poor prep at this time. Caris NGS: MSI-high, MMR deficient, PD-L1: Positive, CPS: 1, TMB: High, 36 mutations/Mb, FGFR 2b: Positive, 2+, 40%, HER2: Negative    - Discussed that for localized gastric adenocarcinoma the treatment options include perioperative chemotherapy followed by gastrectomy-this is an extensive procedure and goals of care should be discussed prior to this and considering his age and comorbidities, I do not think patient is a surgical candidate.  I discussed this with his surgeon from [facility] [place] who agrees with it.   - Next available option would be chemoradiation.  We discussed risks and benefits in detail.  Considering his age and comorbidities, chemoradiation might be intolerable for the patient.  Patient was evaluated by Dr. Dannielle at [facility] who arranged for palliative care at that time.  We discussed risk versus benefits of chemotherapy with radiation extensively with the patient.  I worry that chemo RT might be actually detrimental for the patient than beneficial.  After extensive discussion patient decided to not pursue this  option. -Patient is agreeable to pursue with the palliative care/hospice at that time.  Daughter stated that she will reach out to them at this time. 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  Will send further testing for BRAF V600 E mutation and MLH1 hyper methylation test.  If this is consistent with hereditary nature of carcinoma, will reach out to the patient and family - Recommended that we can pursue palliative radiation if patient has pain or bleeding in future.  Recommended patient and family to reach out to us  in future with any questions or concerns  Rectal high-grade dysplasia/intramucosal adenocarcinoma Patient had high-grade dysplasia and rectal polyp/intramucosal adenocarcinoma but also had poor prep during this colonoscopy.  - Patient does not wish to do further workup at this time.   Palliative care planning for advanced cancer/goals of care discussion Advanced cancers and frail condition necessitate palliative care focus. Preference for maintaining current health level.  - Initiated palliative care referral for symptom management and support. - Monitor for symptoms such as pain or bleeding; consider palliative radiation if needed.  Iron deficiency anemia Fatigue somewhat improved with IV iron.  -Continue oral iron every other day.  The patient understands the plans discussed today and is in agreement with them.  He knows to contact our office if he develops concerns prior to his next appointment.  I provided 40 minutes of face-to-face time during this encounter and > 50% was spent counseling as documented under my assessment and plan.    I,Helena R Teague,acting as a Neurosurgeon for Katiria Calame  Davonna, MD.,have documented all relevant documentation on the behalf of Mickiel Davonna,  MD,as directed by  Mickiel Davonna, MD while in the presence of Mickiel Davonna, MD.  I, Mickiel Davonna MD, have reviewed the above documentation for accuracy and completeness, and I agree with the above.     Verneta JONELLE Ege   [facility] [place] - A DEPT OF [facility] [address] [place] [address] Dept: [phone] Dept Fax: [phone]   No orders of the defined types were placed in this encounter.    ONCOLOGY HISTORY:   Gastric adenocarcinoma:  -06/30/2024: Admitted to the hospital with a hemoglobin of 7.1 -07/04/2024: Endoscopy: Normal esophagus. Actively bleeding Dieulafoy lesion of stomach. Hemostasis achieved. Malignant gastric tumor in the gastric antrum. Biopsied x 8 . Normal duodenal bulb and second portion of the duodenum.  Pathology: Gastric mass, biopsy: Invasive moderate to poorly differentiated adenocarcinoma -08/01/2024: Initial PET: Markedly hypermetabolic concentric soft tissue mass in gastric body, consistent with primary gastric carcinoma. 8 mm hypermetabolic lymph node along the inferior aspect of the gastric antrum, suspicious for metastatic disease. 8 mm subpleural nodule in the posterolateral left upper lobe shows mild FDG uptake, but is not hypermetabolic. 2.8 cm hypermetabolic mass in the left lateral wall of the rectum, highly suspicious for primary rectal carcinoma. - 07/30/2024: CT chest abdomen and pelvis w/wo contrast([facility]): Irregular wall thickening of the gastric antrum, likely corresponds to known adenocarcinoma.  No definite evidence of metastatic disease.  This study is limited by the absence of IV contrast due to IV extravasation. - 08/01/2024: PET: Markedly hypermetabolic concentric soft tissue mass in gastric body,consistent with primary gastric carcinoma. 8 mm hypermetabolic lymph node along the inferior aspect of the gastric antrum, suspicious for metastatic disease.  8 mm subpleural nodule in the posterolateral left upper lobe shows mild FDG uptake, but is not hypermetabolic. 2.8 cm hypermetabolic mass in the left lateral wall of the rectum, highly suspicious for primary rectal carcinoma. Recommend correlation with rectal exam and colonoscopy. -08/15/2024: CEA: 3.1 (normal) -08/30/2024: Caris NGS: MSI-high, MMR deficient, PD-L1: Positive, CPS: 1, TMB: High, 36 mutations/Mb, FGFR 2b: Positive, 2+, 40%, HER2: Negative  -ARID1A: Positive, HER2 pathogenic variant present, PIK 3 CA: Pathogenic variant present.  -BRAF: NTRK 1/2/3, RET, BCOR, CCNE1, CDH1, CLDN18, EBER, EGFR, FGFR2, KRAS, MET, RHOA: Negative -08/31/2024: Sigmoidoscopy: One 25 mm polyp found in the rectum, with partial and mucosal resections.  Pathology: Tubulovillous adenoma with at least high-grade dysplasia / intramucosal adenocarcinoma.  -09/10/2024: EGD/EUS: A 40 mm hypoechoic lobulated mass in the distal gastric body with sonographic evidence suggesting invasion into and through the muscularis propria, biopsied. Oozing bleeding and stigmata present. One malignant-appearing lymph node in the gastrohepatic ligament.  Pathology: Invasive moderate to poorly differentiated adenocarcinoma. Loss of nuclear expression in MLH1 and PMS2.    Current Treatment: Palliative care/hospice  INTERVAL HISTORY:   Discussed the use of AI scribe software for clinical note transcription with the patient, who gave verbal consent to proceed.  History of Present Illness Dustin Chandler is an 89 year old male with stomach cancer and a probable rectal cancer who presents for follow-up on his cancer treatment options.Patient is accompanied by his wife and 2 daughters with another daughter over the phone.  The patient has been told he has stomach cancer and has discussed treatment options with me. The rectal intramucosal cancer was identified through a biopsy, but the full extent is unknown  due to incomplete colonoscopy  preparation.  He has experienced a decline in physical activity, spending most of his time sitting in a chair or in bed, and is no longer active around the house. No current symptoms of bleeding, such as black stools, and no pain at this time.  He has consulted with multiple specialists, including a radiation oncologist, to discuss potential treatment options.  Patient at this time is willing to go ahead with palliative care/hospice route.  I have reviewed the past medical history, past surgical history, social history and family history with the patient and they are unchanged from previous note.  ALLERGIES:  is allergic to lisinopril, monoferric  [ferric derisomaltose ], feraheme [ferumoxytol ], amlodipine , fish allergy, peanut-containing drug products, penicillin g, penicillins, and tomato.  MEDICATIONS:  Current Outpatient Medications  Medication Sig Dispense Refill   acetaminophen  (TYLENOL ) 325 MG tablet Take 2 tablets (650 mg total) by mouth every 6 (six) hours as needed for mild pain (pain score 1-3) or fever (or Fever >/= 101).     atorvastatin  (LIPITOR) 40 MG tablet Take 1 tablet (40 mg total) by mouth every evening. 30 tablet 3   bisacodyl  (DULCOLAX) 5 MG EC tablet Take 1 tablet (5 mg total) by mouth daily as needed for moderate constipation.     cetirizine  (ZYRTEC ) 10 MG tablet Take 10 mg by mouth daily as needed for allergies.     Cholecalciferol (VITAMIN D3) LIQD Take 1 drop by mouth daily.     cloNIDine  (CATAPRES ) 0.1 MG tablet Take 0.1 mg by mouth daily.     diphenhydrAMINE  (BENADRYL ) 25 MG tablet Take 25 mg by mouth at bedtime.     guaiFENesin  (MUCINEX ) 600 MG 12 hr tablet Take 1 tablet (600 mg total) by mouth 2 (two) times daily as needed. (Patient taking differently: Take 600 mg by mouth 2 (two) times daily as needed for cough or to loosen phlegm.) 20 tablet 0   hydrALAZINE  (APRESOLINE ) 25 MG tablet Take 1 tablet (25 mg total) by mouth in the morning and at bedtime. 60 tablet 11    pantoprazole  (PROTONIX ) 40 MG tablet Take 1 tablet (40 mg total) by mouth 2 (two) times daily before a meal. 60 tablet 11   sucralfate  (CARAFATE ) 1 GM/10ML suspension Take 10 mLs (1 g total) by mouth 2 (two) times daily. 420 mL 1   thiamine  (VITAMIN B-1) 100 MG tablet Take 100 mg by mouth daily.     vitamin B-12 (CYANOCOBALAMIN) 1000 MCG tablet Take 1 tablet (1,000 mcg total) by mouth daily. 30 tablet 3   No current facility-administered medications for this visit.    REVIEW OF SYSTEMS:   Constitutional: Denies fevers, chills or abnormal weight loss Eyes: Denies blurriness of vision Ears, nose, mouth, throat, and face: Denies mucositis or sore throat Respiratory: Denies cough, dyspnea or wheezes Cardiovascular: Denies palpitation, chest discomfort or lower extremity swelling Gastrointestinal:  Denies nausea, heartburn or change in bowel habits Skin: Denies abnormal skin rashes Lymphatics: Denies new lymphadenopathy or easy bruising Neurological:Denies numbness, tingling or new weaknesses Behavioral/Psych: Mood is stable, no new changes  All other systems were reviewed with the patient and are negative.   VITALS:  There were no vitals taken for this visit.  Wt Readings from Last 3 Encounters:  09/10/24 170 lb (77.1 kg)  09/06/24 160 lb (72.6 kg)  08/15/24 158 lb 12.8 oz (72 kg)    There is no height or weight on file to calculate BMI.  Performance status (ECOG): 3 - Symptomatic, >  50% confined to bed  PHYSICAL EXAM:   GENERAL:alert, no distress and comfortable, frail male SKIN: skin color, texture, turgor are normal, no rashes or significant lesions LYMPH:  no palpable lymphadenopathy in the cervical, axillary or inguinal LUNGS: clear to auscultation and percussion with normal breathing effort HEART: regular rate & rhythm and no murmurs and no lower extremity edema ABDOMEN:abdomen soft, non-tender and normal bowel sounds Musculoskeletal:no cyanosis of digits and no clubbing   NEURO: alert & oriented x 3 with fluent speech, no focal motor/sensory deficits  LABORATORY DATA:  I have reviewed the data as listed    Component Value Date/Time   NA 140 08/15/2024 0847   K 4.3 08/15/2024 0847   CL 108 08/15/2024 0847   CO2 24 08/15/2024 0847   GLUCOSE 114 (H) 08/15/2024 0847   BUN 19 08/15/2024 0847   CREATININE 0.67 08/15/2024 0847   CREATININE 0.80 06/29/2012 1009   CALCIUM  9.9 08/15/2024 0847   PROT 6.5 08/15/2024 0847   ALBUMIN 3.0 (L) 08/15/2024 0847   AST 18 08/15/2024 0847   ALT 12 08/15/2024 0847   ALKPHOS 55 08/15/2024 0847   BILITOT 0.7 08/15/2024 0847   GFRNONAA >60 08/15/2024 0847   GFRAA >60 01/03/2020 1824     Lab Results  Component Value Date   WBC 5.4 08/15/2024   NEUTROABS 3.5 08/15/2024   HGB 9.0 (L) 08/15/2024   HCT 29.9 (L) 08/15/2024   MCV 85.4 08/15/2024   PLT 242 08/15/2024     RADIOGRAPHIC STUDIES: I have personally reviewed the radiological images as listed and agreed with the findings in the report.  NM PET Image Initial (PI) Skull Base To Thigh CLINICAL DATA:  Initial treatment strategy for gastric carcinoma.  EXAM: NUCLEAR MEDICINE PET SKULL BASE TO THIGH  TECHNIQUE: 7.7 mCi F-18 FDG was injected intravenously. Full-ring PET imaging was performed from the skull base to thigh after the radiotracer. CT data was obtained and used for attenuation correction and anatomic localization.  Fasting blood glucose: 141 mg/dl  COMPARISON:  None Available.  FINDINGS: Mediastinal blood-pool activity (background): SUV max = 2.2  Liver activity (reference): SUV max = N/A  NECK:  No hypermetabolic lymph nodes or masses.  Incidental CT findings:  None.  CHEST: No hypermetabolic lymph nodes. 8 mm subpleural nodule in the posterolateral left upper lobe on image 53/202 shows mild FDG uptake with SUV max of 1.4. No other suspicious pulmonary nodule seen.  Incidental CT findings:  None.  ABDOMEN/PELVIS: Concentric soft  tissue mass is seen in gastric body which shows marked hypermetabolism, with SUV max of 15.5. This is consistent with primary gastric carcinoma. An 8 mm hypermetabolic lymph node is seen along the inferior aspect of the gastric antrum on image 83/202 which has SUV max of 3.1, and is suspicious for metastatic disease. No other hypermetabolic lymph nodes seen within the abdomen or pelvis. No abnormal hypermetabolic activity within the liver, pancreas, adrenal glands, or spleen.  Masslike soft tissue density measuring 2.8 x 2.3 cm is seen in the left lateral wall the rectum which shows marked hypertrophy metabolism, with SUV max of 17.0. This is highly suspicious for primary rectal carcinoma.  Incidental CT findings:  Mildly enlarged prostate.  SKELETON: No focal hypermetabolic bone lesions to suggest skeletal metastasis.  Incidental CT findings:  None.  IMPRESSION: Markedly hypermetabolic concentric soft tissue mass in gastric body, consistent with primary gastric carcinoma.  8 mm hypermetabolic lymph node along the inferior aspect of the gastric antrum, suspicious for  metastatic disease.  8 mm subpleural nodule in the posterolateral left upper lobe shows mild FDG uptake, but is not hypermetabolic. This is indeterminate, and differential diagnosis includes granuloma, metastasis, and primary bronchogenic carcinoma.  2.8 cm hypermetabolic mass in the left lateral wall of the rectum, highly suspicious for primary rectal carcinoma. Recommend correlation with rectal exam and colonoscopy.  Electronically Signed   By: Norleen DELENA Kil M.D.   On: 08/09/2024 14:07

## 2024-09-19 ENCOUNTER — Encounter: Payer: Self-pay | Admitting: *Deleted

## 2024-09-19 ENCOUNTER — Inpatient Hospital Stay: Attending: Oncology | Admitting: Oncology

## 2024-09-19 VITALS — BP 139/80 | HR 72 | Temp 98.2°F | Resp 19

## 2024-09-19 DIAGNOSIS — D500 Iron deficiency anemia secondary to blood loss (chronic): Secondary | ICD-10-CM | POA: Diagnosis not present

## 2024-09-19 DIAGNOSIS — Z79899 Other long term (current) drug therapy: Secondary | ICD-10-CM | POA: Insufficient documentation

## 2024-09-19 DIAGNOSIS — K6282 Dysplasia of anus: Secondary | ICD-10-CM | POA: Diagnosis not present

## 2024-09-19 DIAGNOSIS — K629 Disease of anus and rectum, unspecified: Secondary | ICD-10-CM | POA: Diagnosis not present

## 2024-09-19 DIAGNOSIS — Z7189 Other specified counseling: Secondary | ICD-10-CM

## 2024-09-19 DIAGNOSIS — D509 Iron deficiency anemia, unspecified: Secondary | ICD-10-CM | POA: Insufficient documentation

## 2024-09-19 DIAGNOSIS — C162 Malignant neoplasm of body of stomach: Secondary | ICD-10-CM | POA: Diagnosis present

## 2024-09-19 DIAGNOSIS — I8290 Acute embolism and thrombosis of unspecified vein: Secondary | ICD-10-CM | POA: Diagnosis not present

## 2024-09-19 DIAGNOSIS — C169 Malignant neoplasm of stomach, unspecified: Secondary | ICD-10-CM

## 2024-09-19 NOTE — Patient Instructions (Addendum)
 [facility] at [facility] Discharge Instructions   You were seen and examined today by Dr. Davonna.  She reviewed the results of your stomach biopsy which shows you have a cancer called adenocarcinoma.   She discussed with you going to hospice and living out the rest of your life in quality. Treatment would be very difficult for you to tolerate.    Return as scheduled.    Thank you for choosing  Cancer Center at [facility] to provide your oncology and hematology care.  To afford each patient quality time with our provider, please arrive at least 15 minutes before your scheduled appointment time.   If you have a lab appointment with the Cancer Center please come in thru the Main Entrance and check in at the main information desk.  You need to re-schedule your appointment should you arrive 10 or more minutes late.  We strive to give you quality time with our providers, and arriving late affects you and other patients whose appointments are after yours.  Also, if you no show three or more times for appointments you may be dismissed from the clinic at the providers discretion.     Again, thank you for choosing [facility].  Our hope is that these requests will decrease the amount of time that you wait before being seen by our physicians.       _____________________________________________________________  Should you have questions after your visit to [facility], please contact our office at [phone] and follow the prompts.  Our office hours are 8:00 a.m. and 4:30 p.m. Monday - Friday.  Please note that voicemails left after 4:00 p.m. may not be returned until the following business day.  We are closed weekends and major holidays.  You do have access to a nurse 24-7, just call the main number to the clinic [phone] and do not press any options, hold on the line and a nurse will answer the phone.    For prescription  refill requests, have your pharmacy contact our office and allow 72 hours.    Due to Covid, you will need to wear a mask upon entering the hospital. If you do not have a mask, a mask will be given to you at the Main Entrance upon arrival. For doctor visits, patients may have 1 support person age 14 or older with them. For treatment visits, patients can not have anyone with them due to social distancing guidelines and our immunocompromised population.

## 2024-09-25 ENCOUNTER — Encounter: Payer: Self-pay | Admitting: Oncology

## 2024-09-30 ENCOUNTER — Emergency Department (HOSPITAL_COMMUNITY)

## 2024-09-30 ENCOUNTER — Encounter (HOSPITAL_COMMUNITY): Payer: Self-pay

## 2024-09-30 ENCOUNTER — Other Ambulatory Visit: Payer: Self-pay

## 2024-09-30 ENCOUNTER — Emergency Department (HOSPITAL_COMMUNITY)
Admission: EM | Admit: 2024-09-30 | Discharge: 2024-09-30 | Disposition: A | Discharge status: Home / Self Care | Attending: Emergency Medicine | Admitting: Emergency Medicine

## 2024-09-30 DIAGNOSIS — Z79899 Other long term (current) drug therapy: Secondary | ICD-10-CM | POA: Insufficient documentation

## 2024-09-30 DIAGNOSIS — Z91010 Allergy to peanuts: Secondary | ICD-10-CM | POA: Diagnosis not present

## 2024-09-30 DIAGNOSIS — R0602 Shortness of breath: Secondary | ICD-10-CM | POA: Diagnosis present

## 2024-09-30 DIAGNOSIS — Z7901 Long term (current) use of anticoagulants: Secondary | ICD-10-CM | POA: Diagnosis not present

## 2024-09-30 DIAGNOSIS — I2699 Other pulmonary embolism without acute cor pulmonale: Secondary | ICD-10-CM | POA: Diagnosis not present

## 2024-09-30 HISTORY — DX: Iron deficiency anemia, unspecified: D50.9

## 2024-09-30 HISTORY — DX: Malignant (primary) neoplasm, unspecified: C80.1

## 2024-09-30 HISTORY — DX: Polyneuropathy, unspecified: G62.9

## 2024-09-30 LAB — CBC WITH DIFFERENTIAL/PLATELET
Abs Immature Granulocytes: 0 K/uL (ref 0.00–0.07)
Basophils Absolute: 0 K/uL (ref 0.0–0.1)
Basophils Relative: 1 %
Eosinophils Absolute: 0.1 K/uL (ref 0.0–0.5)
Eosinophils Relative: 1 %
HCT: 29.4 % — ABNORMAL LOW (ref 39.0–52.0)
Hemoglobin: 8.8 g/dL — ABNORMAL LOW (ref 13.0–17.0)
Immature Granulocytes: 0 %
Lymphocytes Relative: 23 %
Lymphs Abs: 1.3 K/uL (ref 0.7–4.0)
MCH: 25.4 pg — ABNORMAL LOW (ref 26.0–34.0)
MCHC: 29.9 g/dL — ABNORMAL LOW (ref 30.0–36.0)
MCV: 84.7 fL (ref 80.0–100.0)
Monocytes Absolute: 0.6 K/uL (ref 0.1–1.0)
Monocytes Relative: 10 %
Neutro Abs: 3.6 K/uL (ref 1.7–7.7)
Neutrophils Relative %: 65 %
Platelets: 253 K/uL (ref 150–400)
RBC: 3.47 MIL/uL — ABNORMAL LOW (ref 4.22–5.81)
RDW: 18.2 % — ABNORMAL HIGH (ref 11.5–15.5)
WBC: 5.5 K/uL (ref 4.0–10.5)
nRBC: 0 % (ref 0.0–0.2)

## 2024-09-30 LAB — COMPREHENSIVE METABOLIC PANEL WITH GFR
ALT: 7 U/L (ref 0–44)
AST: 26 U/L (ref 15–41)
Albumin: 3.6 g/dL (ref 3.5–5.0)
Alkaline Phosphatase: 78 U/L (ref 38–126)
Anion gap: 14 (ref 5–15)
BUN: 16 mg/dL (ref 8–23)
CO2: 26 mmol/L (ref 22–32)
Calcium: 10.4 mg/dL — ABNORMAL HIGH (ref 8.9–10.3)
Chloride: 104 mmol/L (ref 98–111)
Creatinine, Ser: 0.79 mg/dL (ref 0.61–1.24)
GFR, Estimated: >60 mL/min (ref >60)
Glucose, Bld: 138 mg/dL — ABNORMAL HIGH (ref 70–99)
Potassium: 3.8 mmol/L (ref 3.5–5.1)
Sodium: 144 mmol/L (ref 135–145)
Total Bilirubin: 0.4 mg/dL (ref 0.0–1.2)
Total Protein: 6.5 g/dL (ref 6.5–8.1)

## 2024-09-30 LAB — PRO BRAIN NATRIURETIC PEPTIDE: Pro Brain Natriuretic Peptide: 357 pg/mL — ABNORMAL HIGH (ref <300.0)

## 2024-09-30 LAB — D-DIMER, QUANTITATIVE: D-Dimer, Quant: 7.61 ug{FEU}/mL — ABNORMAL HIGH (ref 0.00–0.50)

## 2024-09-30 MED ORDER — IPRATROPIUM-ALBUTEROL 0.5-2.5 (3) MG/3ML IN SOLN
3.0000 mL | Freq: Once | RESPIRATORY_TRACT | Status: AC
  Administered 2024-09-30: 3 mL via RESPIRATORY_TRACT
  Filled 2024-09-30: qty 3

## 2024-09-30 MED ORDER — IOHEXOL 350 MG/ML SOLN
75.0000 mL | Freq: Once | INTRAVENOUS | Status: AC | PRN
  Administered 2024-09-30: 75 mL via INTRAVENOUS

## 2024-09-30 NOTE — Discharge Instructions (Signed)
 Start back taking your blood thinner, Eliquis  5 mg twice a day.  Get in touch with your oncologist as needed

## 2024-09-30 NOTE — ED Triage Notes (Signed)
 Pt arrived via [facility] from home with family who report the the Pt has been c/o feeling SOB and really weak the past few days. Pt reports Hx of iron deficiency anemia and cancer. Pts doctor recently discussed palliative care with them as well.

## 2024-09-30 NOTE — Progress Notes (Signed)
 Hematology/oncology progress note  I was reached out to by Dr. Zammit for anticoagulation recommendations for this patient.  Patient is a 89 year old male well-known to me from oncology clinic with a history of gastric adenocarcinoma.  Patient was recently referred to hospice on our previous clinic visit.  He presented to the ER with shortness of breath and a CT angio of the chest done at this time showed a near occlusive pulmonary embolism.  Labs today showed a hemoglobin of 8.8 which has been stable without transfusion for the past 2 months.  Recommendations: - After discussing extensively risk versus benefits with the patient, of starting anticoagulation including the risks of bleeding with an existing gastric adenocarcinoma, can consider starting patient on Eliquis  twice daily.  Would recommend starting and continuing at 5 mg twice daily instead of loading dose of 10 mg twice daily for 7 days. -If patient starts bleeding, with drop in hemoglobin, can consider rediscussing risk versus benefits at that time we will discontinue anticoagulation. -Please reach out with any questions or concerns.  Mickiel Dry, MD Hematology/Oncology [facility]

## 2024-10-01 NOTE — ED Provider Notes (Signed)
 [place] [facility]MENT AT [facility] Provider Note   CSN: [id number] Arrival date & time: 09/30/24  1515     Patient presents with: Shortness of Breath   Dustin Chandler is a 89 y.o. male.   Patient complains of some shortness of breath.  Patient has gastric cancer and has been looking into hospice care now.  The history is provided by a relative. No language interpreter was used.  Shortness of Breath Severity:  Mild Onset quality:  Sudden Timing:  Intermittent Progression:  Waxing and waning Chronicity:  New Context: activity   Relieved by:  Nothing Worsened by:  Nothing Associated symptoms: no abdominal pain, no chest pain, no cough, no headaches and no rash        Prior to Admission medications   Medication Sig Start Date End Date Taking? Authorizing Provider  acetaminophen  (TYLENOL ) 325 MG tablet Take 2 tablets (650 mg total) by mouth every 6 (six) hours as needed for mild pain (pain score 1-3) or fever (or Fever >/= 101). 07/05/24   Johnson, Clanford L, MD  atorvastatin  (LIPITOR) 40 MG tablet Take 1 tablet (40 mg total) by mouth every evening. 10/03/21   Drusilla Sabas RAMAN, MD  bisacodyl  (DULCOLAX) 5 MG EC tablet Take 1 tablet (5 mg total) by mouth daily as needed for moderate constipation. 07/05/24   Johnson, Clanford L, MD  cetirizine  (ZYRTEC ) 10 MG tablet Take 10 mg by mouth daily as needed for allergies. 04/01/15   [provider]  Cholecalciferol (VITAMIN D3) LIQD Take 1 drop by mouth daily.    [provider]  cloNIDine  (CATAPRES ) 0.1 MG tablet Take 0.1 mg by mouth daily.    [provider]  diphenhydrAMINE  (BENADRYL ) 25 MG tablet Take 25 mg by mouth at bedtime.    [provider]  guaiFENesin  (MUCINEX ) 600 MG 12 hr tablet Take 1 tablet (600 mg total) by mouth 2 (two) times daily as needed. Patient taking differently: Take 600 mg by mouth 2 (two) times daily as needed for cough or to loosen phlegm. 05/05/24   Stuart Vernell Norris, PA-C  hydrALAZINE  (APRESOLINE ) 25 MG tablet Take 1 tablet (25 mg total) by mouth in the morning and at bedtime. 10/03/21 09/19/24  Drusilla Sabas RAMAN, MD  pantoprazole  (PROTONIX ) 40 MG tablet Take 1 tablet (40 mg total) by mouth 2 (two) times daily before a meal. 09/13/24 09/13/25  Mansouraty, Aloha Raddle., MD  sucralfate  (CARAFATE ) 1 GM/10ML suspension Take 10 mLs (1 g total) by mouth 2 (two) times daily. 09/10/24 09/10/25  Mansouraty, Aloha Raddle., MD  thiamine  (VITAMIN B-1) 100 MG tablet Take 100 mg by mouth daily.    [provider]  vitamin B-12 (CYANOCOBALAMIN) 1000 MCG tablet Take 1 tablet (1,000 mcg total) by mouth daily. 10/03/21   Drusilla Sabas RAMAN, MD    Allergies: Lisinopril, Monoferric  [ferric derisomaltose ], Feraheme [ferumoxytol ], Amlodipine , Fish allergy, Peanut-containing drug products, Penicillin g, Penicillins, and Tomato    Review of Systems  Constitutional:  Negative for appetite change and fatigue.  HENT:  Negative for congestion, ear discharge and sinus pressure.   Eyes:  Negative for discharge.  Respiratory:  Positive for shortness of breath. Negative for cough.   Cardiovascular:  Negative for chest pain.  Gastrointestinal:  Negative for abdominal pain and diarrhea.  Genitourinary:  Negative for frequency and hematuria.  Musculoskeletal:  Negative for back pain.  Skin:  Negative for rash.  Neurological:  Negative for seizures and headaches.  Psychiatric/Behavioral:  Negative  for hallucinations.     Updated Vital Signs BP 105/69   Pulse (!) 54   Temp 98.3 F (36.8 C) (Oral)   Resp (!) 21   Ht 6' 1 (1.854 m)   Wt 77.1 kg   SpO2 100%   BMI 22.43 kg/m   Physical Exam Vitals and nursing note reviewed.  Constitutional:      Appearance: He is well-developed.  HENT:     Head: Normocephalic.     Nose: Nose normal.  Eyes:     General: No scleral icterus.    Conjunctiva/sclera: Conjunctivae normal.  Neck:     Thyroid: No thyromegaly.   Cardiovascular:     Rate and Rhythm: Normal rate and regular rhythm.     Heart sounds: No murmur heard.    No friction rub. No gallop.  Pulmonary:     Breath sounds: No stridor. No wheezing or rales.  Chest:     Chest wall: No tenderness.  Abdominal:     General: There is no distension.     Tenderness: There is no abdominal tenderness. There is no rebound.  Musculoskeletal:        General: Normal range of motion.     Cervical back: Neck supple.  Lymphadenopathy:     Cervical: No cervical adenopathy.  Skin:    Findings: No erythema or rash.  Neurological:     Mental Status: He is alert and oriented to person, place, and time.     Motor: No abnormal muscle tone.     Coordination: Coordination normal.  Psychiatric:        Behavior: Behavior normal.     (all labs ordered are listed, but only abnormal results are displayed) Labs Reviewed  CBC WITH DIFFERENTIAL/PLATELET - Abnormal; Notable for the following components:      Result Value   RBC 3.47 (*)    Hemoglobin 8.8 (*)    HCT 29.4 (*)    MCH 25.4 (*)    MCHC 29.9 (*)    RDW 18.2 (*)    All other components within normal limits  COMPREHENSIVE METABOLIC PANEL WITH GFR - Abnormal; Notable for the following components:   Glucose, Bld 138 (*)    Calcium  10.4 (*)    All other components within normal limits  PRO BRAIN NATRIURETIC PEPTIDE - Abnormal; Notable for the following components:   Pro Brain Natriuretic Peptide 357.0 (*)    All other components within normal limits  D-DIMER, QUANTITATIVE - Abnormal; Notable for the following components:   D-Dimer, Quant 7.61 (*)    All other components within normal limits    EKG: EKG Interpretation Date/Time:  Monday September 30 2024 15:51:13 EDT Ventricular Rate:  79 PR Interval:  190 QRS Duration:  84 QT Interval:  388 QTC Calculation: 444 R Axis:   58  Text Interpretation: Normal sinus rhythm with sinus arrhythmia Normal ECG When compared with ECG of 30-Jun-2024 14:18,  PREVIOUS ECG IS PRESENT Confirmed by Suzette Pac [phone]) on 09/30/2024 7:37:13 PM  Radiology: CT Angio Chest PE W and/or Wo Contrast Result Date: 09/30/2024 EXAM: CTA of the Chest with contrast for PE 09/30/2024 09:27:35 PM TECHNIQUE: CTA of the chest was performed after the administration of intravenous contrast. Multiplanar reformatted images are provided for review. MIP images are provided for review. Automated exposure control, iterative reconstruction, and/or weight based adjustment of the mA/kV was utilized to reduce the radiation dose to as low as reasonably achievable. COMPARISON: PET CT 08/01/24, CTA chest 08/14/2022. CLINICAL  HISTORY: Pulmonary embolism (PE) suspected, high prob. Pt arrived via OPV from home with family who report the the Pt has been c/o feeling SOB and really weak the past few days. Pt reports Hx of iron deficiency anemia and cancer. Pts doctor recently discussed palliative care with them as well. FINDINGS: PULMONARY ARTERIES: Pulmonary arteries are adequately opacified for evaluation. The pulmonary arteries are normal in caliber. There is a near-occlusive segmental arterial thrombus in the lingula on 6/72 with short linear extension of thrombus into three of its branch arteries on 06/72 to 75. This is a small clot burden with no findings of right heart strain or evidence of further embolism. MEDIASTINUM: There is a small pericardial effusion anteriorly, slightly increased. There is mild cardiomegaly, with scattered two-vessel calcific plaques in the lad and right coronary arteries. The aorta is moderately tortuous, mildly ectatic with patchy calcifications in the aorta and distal innominate artery. There is no aneurysm, stenosis, or dissection. No venous dilatation or pulmonary edema. LYMPH NODES: There are calcified right hilar lymph nodes. There are a few mildly prominent subcarinal lymph nodes up to 1.1 cm in short axis which were present previously and unchanged. No new,  further, or progressive intrathoracic adenopathy is seen. Axillary spaces are clear. The thyroid gland, trachea, main bronchi and thoracic esophagus is unremarkable apart from a small amount of retained secretions in the proximal trachea. LUNGS AND PLEURA: There are bilateral trace pleural effusions similar to the prior studies. Branching microcalcifications are noted in the posterior basal lower lobes, consistent with calcified small airway impactions. Tubular branching opacities noted in the lateral right upper lobe in keeping with small airway impactions also seen previously. . There is a calcified granuloma in the right lung apex. Another in the superior segment of the right lower lobe. There is posterior atelectasis in both lungs. Bronchiolectasis and scattered small airway impactions are seen in the anterior right middle of base. No focal consolidation or pulmonary edema. No pneumothorax. UPPER ABDOMEN: In the upper abdomen, there is a partially visible circumferential mass in the gastric body consistent with the patient's primary gastric cancer. There is a 1 cm chronic cyst in the dome of the right lobe of the liver . Mild adrenal hyperplasia. There is a biopsy clip in the stomach with no new upper abdominal findings. SOFT TISSUES AND BONES: Osteopenia and multilevel bridging enthesopathy of the thoracic spine is noted with degenerative disc disease no concerning regional bone lesions. IMPRESSION: 1. Near-occlusive segmental pulmonary embolus in the lingula with short linear extension into three branch arteries; small clot burden without right heart strain findings . 2. Partially visible circumferential mass in the gastric body consistent with known primary gastric cancer. 3. Small anterior pericardial effusion, slightly increased. Trace Pleural effusions, unchanged . 4. Mild cardiomegaly with calcific atherosclerosis of the aorta, right coronary and lad arteries. 5. Small airway disease as detailed above .  Electronically signed by: Francis Quam MD 09/30/2024 09:57 PM EDT RP Workstation: [id number]   DG Chest 2 View Result Date: 09/30/2024 EXAM: 2 VIEW(S) XRAY OF THE CHEST 09/30/2024 04:11:00 PM COMPARISON: 06/30/2024 CLINICAL HISTORY: SOB. Per chart: Pt arrived via OPV from home with family who report the the Pt has been c/o feeling SOB and really weak the past few days. Pt reports Hx of iron deficiency anemia and cancer. FINDINGS: LUNGS AND PLEURA: No focal pulmonary opacity. No pulmonary edema. No pleural effusion. No pneumothorax. HEART AND MEDIASTINUM: Tortuous aorta with atherosclerosis. BONES AND SOFT TISSUES: Multilevel thoracic osteophytosis.  No acute fracture or destructive lesions. IMPRESSION: 1. No acute cardiopulmonary process. Electronically signed by: Norman Gatlin MD 09/30/2024 04:47 PM EDT RP Workstation: [id number]     Procedures   Medications Ordered in the ED  ipratropium-albuterol  (DUONEB) 0.5-2.5 (3) MG/3ML nebulizer solution 3 mL (3 mLs Nebulization Given 09/30/24 2026)  iohexol  (OMNIPAQUE ) 350 MG/ML injection 75 mL (75 mLs Intravenous Contrast Given 09/30/24 2109)  I spoke with his oncologist Dr. Davonna and she recommended starting Eliquis  back at 5 mg twice a day for his small PE                                  Medical Decision Making Amount and/or Complexity of Data Reviewed Labs: ordered. Radiology: ordered.  Risk Prescription drug management.   Gastric cancer and new small PE.  Patient started on Eliquis  and will follow-up with his oncologist     Final diagnoses:  Other acute pulmonary embolism without acute cor pulmonale [facility])    ED Discharge Orders     None          Suzette Pac, MD 10/01/24 1038

## 2024-10-03 ENCOUNTER — Encounter (HOSPITAL_COMMUNITY): Payer: Self-pay | Admitting: Oncology

## 2024-10-16 ENCOUNTER — Ambulatory Visit: Admitting: Gastroenterology

## 2024-11-18 DEATH — deceased
